# Patient Record
Sex: Male | Born: 1975
Health system: Southern US, Community
[De-identification: ages and names within clinical notes are randomized; demographics above are authoritative.]

## PROBLEM LIST (undated history)

## (undated) ENCOUNTER — Emergency Department (HOSPITAL_BASED_OUTPATIENT_CLINIC_OR_DEPARTMENT_OTHER): Admission: EM | Payer: Self-pay | Source: Home / Self Care

## (undated) DIAGNOSIS — S0990XA Unspecified injury of head, initial encounter: Secondary | ICD-10-CM

## (undated) DIAGNOSIS — E789 Disorder of lipoprotein metabolism, unspecified: Secondary | ICD-10-CM

## (undated) DIAGNOSIS — F419 Anxiety disorder, unspecified: Secondary | ICD-10-CM

## (undated) DIAGNOSIS — R011 Cardiac murmur, unspecified: Secondary | ICD-10-CM

## (undated) DIAGNOSIS — R739 Hyperglycemia, unspecified: Secondary | ICD-10-CM

## (undated) DIAGNOSIS — I10 Essential (primary) hypertension: Secondary | ICD-10-CM

## (undated) DIAGNOSIS — E785 Hyperlipidemia, unspecified: Secondary | ICD-10-CM

## (undated) HISTORY — DX: Disorder of lipoprotein metabolism, unspecified: E78.9

## (undated) HISTORY — PX: LIVER BIOPSY: SHX301

## (undated) HISTORY — PX: OTHER SURGICAL HISTORY: SHX169

## (undated) HISTORY — DX: Hyperlipidemia, unspecified: E78.5

---

## 2011-02-26 ENCOUNTER — Emergency Department (HOSPITAL_BASED_OUTPATIENT_CLINIC_OR_DEPARTMENT_OTHER)
Admission: EM | Admit: 2011-02-26 | Discharge: 2011-02-26 | Disposition: A | Discharge status: Home / Self Care | Payer: Self-pay | Attending: Emergency Medicine | Admitting: Emergency Medicine

## 2011-02-26 ENCOUNTER — Emergency Department (INDEPENDENT_AMBULATORY_CARE_PROVIDER_SITE_OTHER): Payer: Self-pay

## 2011-02-26 ENCOUNTER — Other Ambulatory Visit: Payer: Self-pay

## 2011-02-26 ENCOUNTER — Encounter: Payer: Self-pay | Admitting: Emergency Medicine

## 2011-02-26 DIAGNOSIS — R110 Nausea: Secondary | ICD-10-CM

## 2011-02-26 DIAGNOSIS — R5381 Other malaise: Secondary | ICD-10-CM | POA: Insufficient documentation

## 2011-02-26 DIAGNOSIS — R079 Chest pain, unspecified: Secondary | ICD-10-CM

## 2011-02-26 DIAGNOSIS — R0602 Shortness of breath: Secondary | ICD-10-CM

## 2011-02-26 DIAGNOSIS — R5383 Other fatigue: Secondary | ICD-10-CM | POA: Insufficient documentation

## 2011-02-26 HISTORY — DX: Cardiac murmur, unspecified: R01.1

## 2011-02-26 LAB — BASIC METABOLIC PANEL
Chloride: 101 mEq/L (ref 96–112)
GFR calc Af Amer: >60 mL/min (ref >60)
Potassium: 3.7 mEq/L (ref 3.5–5.1)
Sodium: 136 mEq/L (ref 135–145)

## 2011-02-26 LAB — CARDIAC PANEL(CRET KIN+CKTOT+MB+TROPI)
Relative Index: 0.8 (ref 0.0–2.5)
Total CK: 336 U/L — ABNORMAL HIGH (ref 7–232)
Troponin I: <0.3 ng/mL (ref <0.30)

## 2011-02-26 LAB — CBC
Hemoglobin: 13.5 g/dL (ref 13.0–17.0)
MCHC: 34.2 g/dL (ref 30.0–36.0)
Platelets: 252 10*3/uL (ref 150–400)
RDW: 13.2 % (ref 11.5–15.5)

## 2011-02-26 LAB — DIFFERENTIAL
Basophils Absolute: 0 10*3/uL (ref 0.0–0.1)
Basophils Relative: 0 % (ref 0–1)
Neutro Abs: 8.4 10*3/uL — ABNORMAL HIGH (ref 1.7–7.7)
Neutrophils Relative %: 72 % (ref 43–77)

## 2011-02-26 IMAGING — CR DG CHEST 2V
2 series · 2 of 2 positions shown · non-contrast
Comparison: None.

CLINICAL DATA: Chest pain, shortness of breath and nausea.

CHEST - 2 VIEW

[w chest pa]
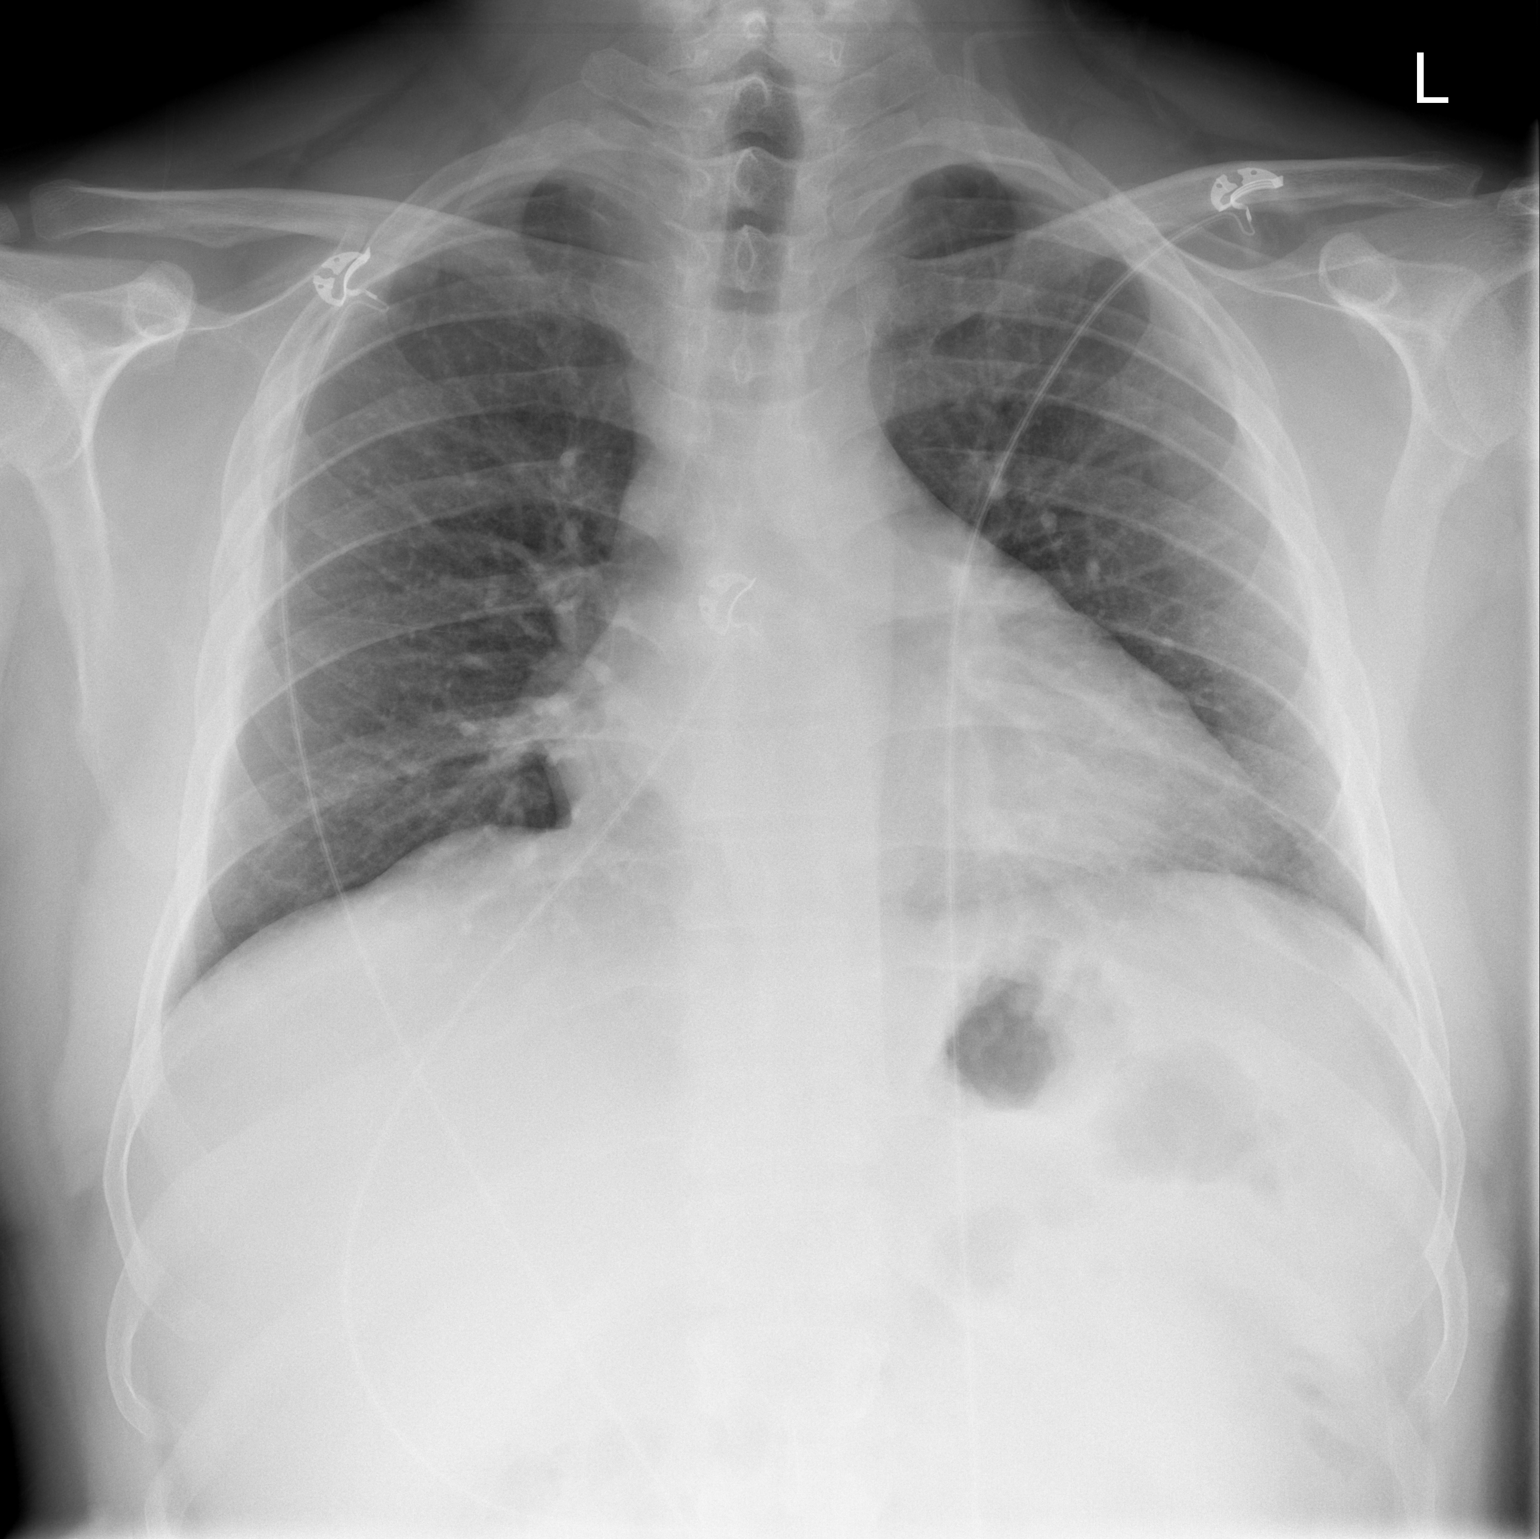

[w chest lat]
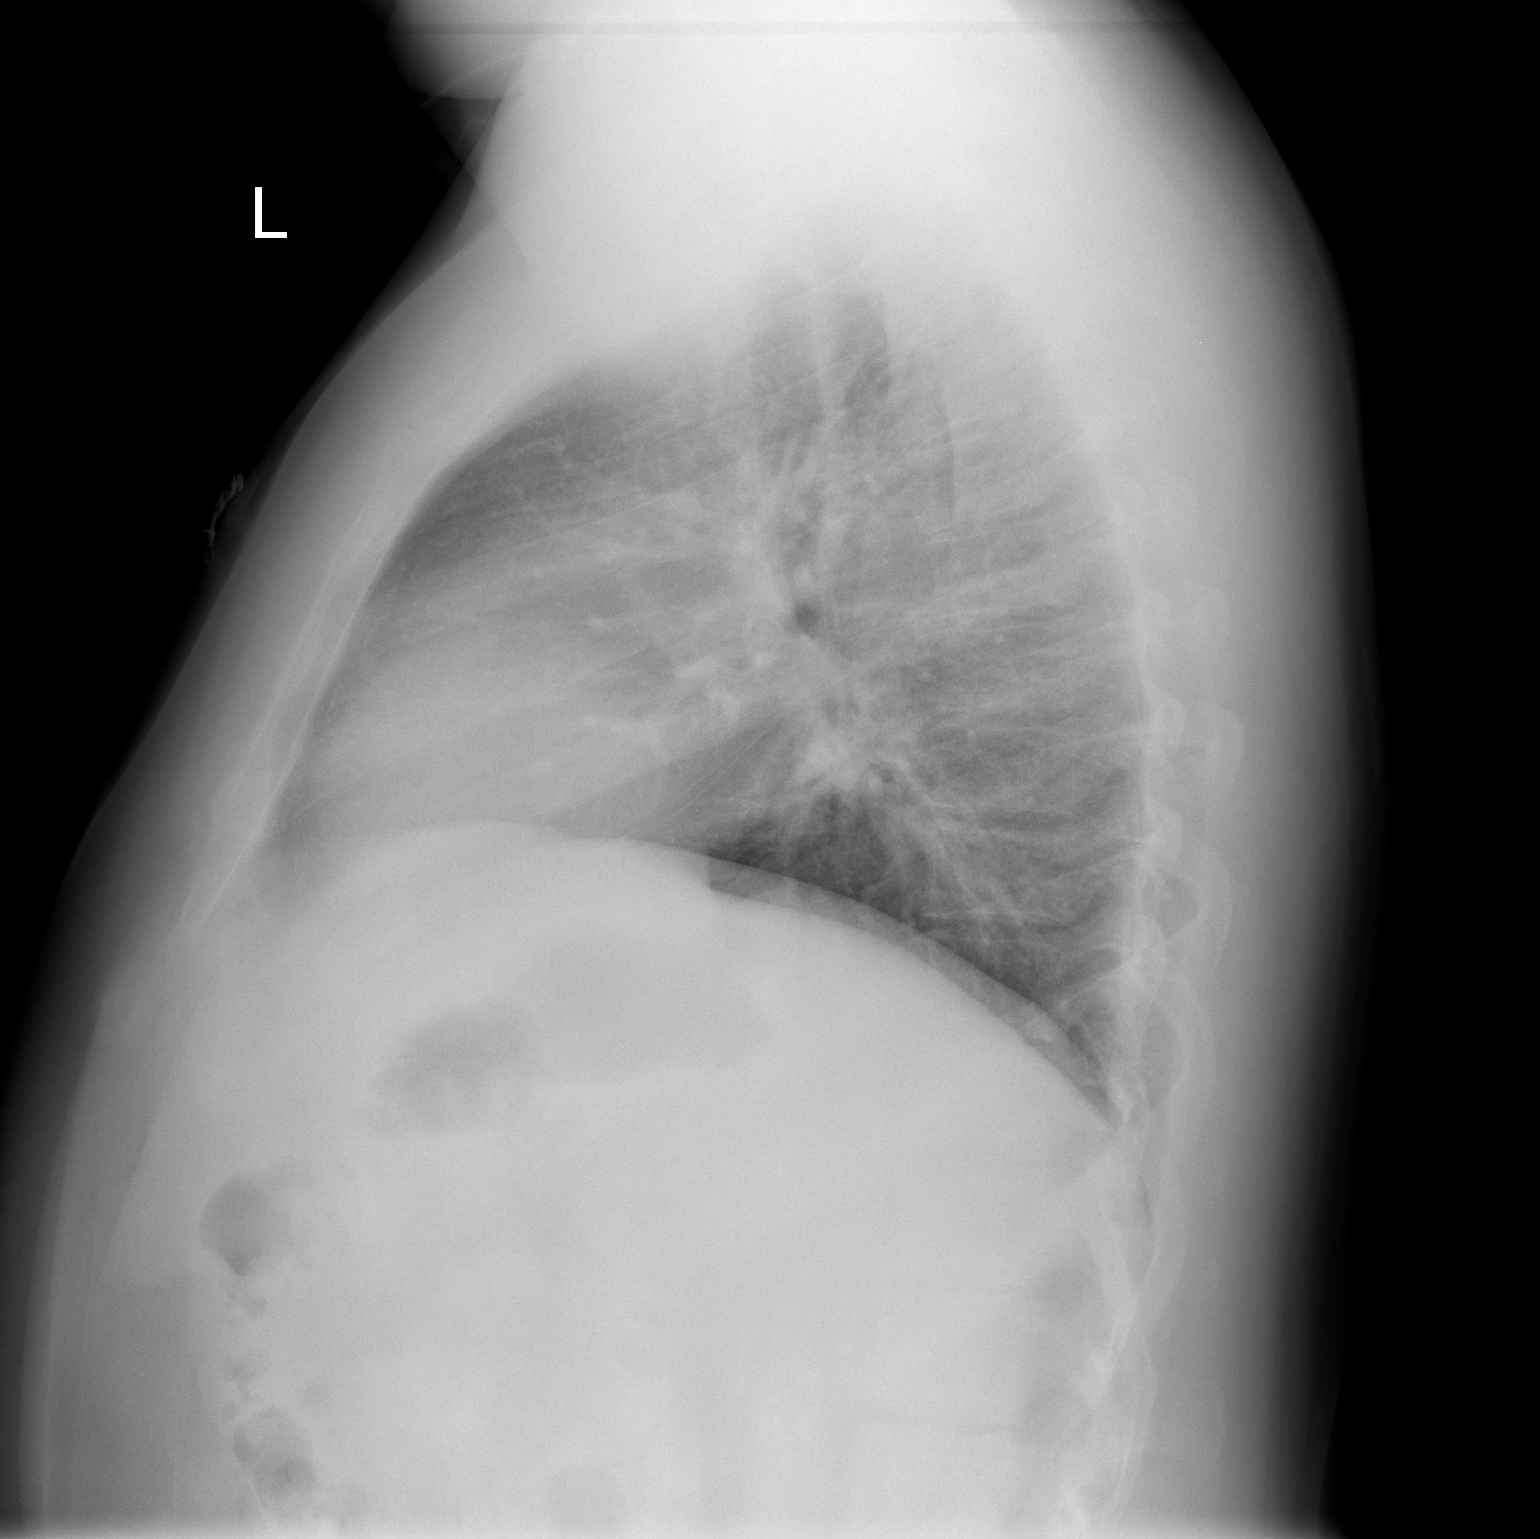

[2 of 2 positions shown; findings below may reference images not displayed]

FINDINGS: The lungs are well-aerated and clear.  There is no
evidence of focal opacification, pleural effusion or pneumothorax.
Retrocardiac density likely reflects normal vasculature.

The heart is normal in size; the mediastinal contour is within
normal limits.  No acute osseous abnormalities are seen.
IMPRESSION: No acute cardiopulmonary process seen.

## 2011-02-26 MED ORDER — AZITHROMYCIN 250 MG PO TABS: 250.0000 mg | ORAL_TABLET | Freq: Every day | ORAL | Status: AC

## 2011-02-26 MED ORDER — AZITHROMYCIN 250 MG PO TABS
500.0000 mg | ORAL_TABLET | Freq: Once | ORAL | Status: AC
  Administered 2011-02-26: 500 mg via ORAL
  Filled 2011-02-26: qty 2

## 2011-02-26 NOTE — ED Provider Notes (Signed)
History     CSN: [id number] Arrival date & time: 02/26/2011  3:48 AM  Chief Complaint  Joshua Brady presents with  . Chest Pain   HPI Comments: Joshua Brady presents with complaint of left-sided chest pain. He has no other medical problems, takes no daily prescription medications and is a nonsmoker. He works as a Electrical engineer at night and notes that tonight is his first shift of the week. He has been up for approximately 22 hours at this time. He states that around 4:00 in the afternoon approximately 12 hours ago he developed a left-sided chest pain which was like somebody "hitting my chest from the inside" and states that this gradually improved and went away with Motrin. It lasted for approximately 4 or 5 hours before going away. Initially it was associated with mild shortness of breath and chills. He has felt some generalized fatigue throughout the day and has not had much to eat. He states that recently he was out of town and has found out that some of the people he was around has been ill with similar symptoms.  Tonight while at work walking from building to building on the campus of [employer], he felt weak, nauseated and fatigued like he wanted to take a nap. He states that he had some recurrence of the chest pain but was not having any trouble with climbing stairs or walking greater than a mile on his route. He denies increased cough but states that he has chronic sinus pain in his face, nasal drainage. He has had some associated headache and neck pain tonight. He is a nonsmoker, nondiabetic, nonhypertensive.  Joshua Brady is a 35 y.o. male presenting with chest pain. The history is provided by the Joshua Brady.  Chest Pain     Past Medical History  Diagnosis Date  . Heart murmur     History reviewed. No pertinent past surgical history.  History reviewed. No pertinent family history.  History  Substance Use Topics  . Smoking status: Never Smoker   . Smokeless tobacco: Not on file  .  Alcohol Use: Yes      Review of Systems  Cardiovascular: Positive for chest pain.  All other systems reviewed and are negative.    Physical Exam  BP 138/89  Pulse 88  Temp(Src) 97.9 F (36.6 C) (Oral)  Resp 18  SpO2 95%  Physical Exam  Nursing note and vitals reviewed. Constitutional: He appears well-developed and well-nourished. No distress.  HENT:  Head: Normocephalic and atraumatic.  Mouth/Throat: Oropharynx is clear and moist. No oropharyngeal exudate.  Eyes: Conjunctivae and EOM are normal. Pupils are equal, round, and reactive to light. Right eye exhibits no discharge. Left eye exhibits no discharge. No scleral icterus.  Neck: Normal range of motion. Neck supple. No JVD present. No thyromegaly present.  Cardiovascular: Normal rate, regular rhythm, normal heart sounds and intact distal pulses.  Exam reveals no gallop and no friction rub.   No murmur heard. Pulmonary/Chest: Effort normal and breath sounds normal. No respiratory distress. He has no wheezes. He has no rales.  Abdominal: Soft. Bowel sounds are normal. He exhibits no distension and no mass. There is no tenderness.  Musculoskeletal: Normal range of motion. He exhibits no edema and no tenderness.  Lymphadenopathy:    He has no cervical adenopathy.  Neurological: He is alert. Coordination normal.  Skin: Skin is warm and dry. No rash noted. No erythema.  Psychiatric: He has a normal mood and affect. His behavior is normal.  ED Course  Procedures  MDM Exam is normal, EKG is normal, suspect some component of infectious syndrome. Has no rash or dysuria or diarrhea. He does have some shortness of breath with left-sided chest pain and intermittent cough. We'll obtain chest x-ray to rule out pneumonia. Lab work pending.  While having chest pain, he presented to the EMS station and had an EKG performed which I have reviewed and appears normal. His was time to 0245 AM  ED ECG REPORT   Date: 02/26/2011   Rate:  85   Rhythm: normal sinus rhythm  QRS Axis: normal  Intervals: normal  ST/T Wave abnormalities: normal  Conduction Disutrbances:none  Narrative Interpretation:   Old EKG Reviewed: none available  Results for orders placed during the hospital encounter of 02/26/11  CARDIAC PANEL(CRET KIN+CKTOT+MB+TROPI)      Component Value Range   Total CK 336 (*) 7 - 232 (U/L)   CK, MB 2.8  0.3 - 4.0 (ng/mL)   Troponin I <0.30  <0.30 (ng/mL)   Relative Index 0.8  0.0 - 2.5   CBC      Component Value Range   WBC 11.7 (*) 4.0 - 10.5 (K/uL)   RBC 4.68  4.22 - 5.81 (MIL/uL)   Hemoglobin 13.5  13.0 - 17.0 (g/dL)   HCT 16.1  09.6 - 04.5 (%)   MCV 84.4  78.0 - 100.0 (fL)   MCH 28.8  26.0 - 34.0 (pg)   MCHC 34.2  30.0 - 36.0 (g/dL)   RDW 40.9  81.1 - 91.4 (%)   Platelets 252  150 - 400 (K/uL)  DIFFERENTIAL      Component Value Range   Neutrophils Relative 72  43 - 77 (%)   Neutro Abs 8.4 (*) 1.7 - 7.7 (K/uL)   Lymphocytes Relative 14  12 - 46 (%)   Lymphs Abs 1.6  0.7 - 4.0 (K/uL)   Monocytes Relative 13 (*) 3 - 12 (%)   Monocytes Absolute 1.6 (*) 0.1 - 1.0 (K/uL)   Eosinophils Relative 1  0 - 5 (%)   Eosinophils Absolute 0.1  0.0 - 0.7 (K/uL)   Basophils Relative 0  0 - 1 (%)   Basophils Absolute 0.0  0.0 - 0.1 (K/uL)  BASIC METABOLIC PANEL      Component Value Range   Sodium 136  135 - 145 (mEq/L)   Potassium 3.7  3.5 - 5.1 (mEq/L)   Chloride 101  96 - 112 (mEq/L)   CO2 25  19 - 32 (mEq/L)   Glucose, Bld 104 (*) 70 - 99 (mg/dL)   BUN 15  6 - 23 (mg/dL)   Creatinine, Ser 7.82  0.50 - 1.35 (mg/dL)   Calcium 9.9  8.4 - 95.6 (mg/dL)   GFR calc non Af Amer >60  >60 (mL/min)   GFR calc Af Amer >60  >60 (mL/min)   No results found.  Imaging shows no significant pneumonia on x-ray. Vital signs normal, no distress. Has a slight leukocytosis with a lymphocytic shift. Suspect infectious process and will treat with antibiotics to cover her sinusitis and atypical pneumonia. Joshua Brady understands  indications for return and agrees to same.  Vida Roller, MD 02/26/11 [phone]

## 2011-02-26 NOTE — ED Notes (Addendum)
Pt reports intermittent chest pain today. While walking tonight at work pt reports chest pain worsened and had shob and nausea with chest pain.

## 2014-06-15 DIAGNOSIS — S0990XA Unspecified injury of head, initial encounter: Secondary | ICD-10-CM

## 2014-06-15 HISTORY — DX: Unspecified injury of head, initial encounter: S09.90XA

## 2015-11-01 ENCOUNTER — Emergency Department (HOSPITAL_BASED_OUTPATIENT_CLINIC_OR_DEPARTMENT_OTHER)
Admission: EM | Admit: 2015-11-01 | Discharge: 2015-11-01 | Disposition: A | Discharge status: Home / Self Care | Payer: Self-pay | Attending: Emergency Medicine | Admitting: Emergency Medicine

## 2015-11-01 ENCOUNTER — Emergency Department (HOSPITAL_BASED_OUTPATIENT_CLINIC_OR_DEPARTMENT_OTHER): Payer: Self-pay

## 2015-11-01 ENCOUNTER — Encounter (HOSPITAL_BASED_OUTPATIENT_CLINIC_OR_DEPARTMENT_OTHER): Payer: Self-pay | Admitting: *Deleted

## 2015-11-01 DIAGNOSIS — W312XXA Contact with powered woodworking and forming machines, initial encounter: Secondary | ICD-10-CM | POA: Insufficient documentation

## 2015-11-01 DIAGNOSIS — S61211A Laceration without foreign body of left index finger without damage to nail, initial encounter: Secondary | ICD-10-CM | POA: Insufficient documentation

## 2015-11-01 DIAGNOSIS — Y9389 Activity, other specified: Secondary | ICD-10-CM | POA: Insufficient documentation

## 2015-11-01 DIAGNOSIS — IMO0002 Reserved for concepts with insufficient information to code with codable children: Secondary | ICD-10-CM

## 2015-11-01 DIAGNOSIS — Y999 Unspecified external cause status: Secondary | ICD-10-CM | POA: Insufficient documentation

## 2015-11-01 DIAGNOSIS — Y929 Unspecified place or not applicable: Secondary | ICD-10-CM | POA: Insufficient documentation

## 2015-11-01 IMAGING — DX DG FINGER INDEX 2+V*L*
3 series · 3 of 3 positions shown · non-contrast
Comparison: None.

CLINICAL DATA: Cut second finger with table saw.  Open wound.

EXAM:
LEFT INDEX FINGER 2+V

[finger ap]
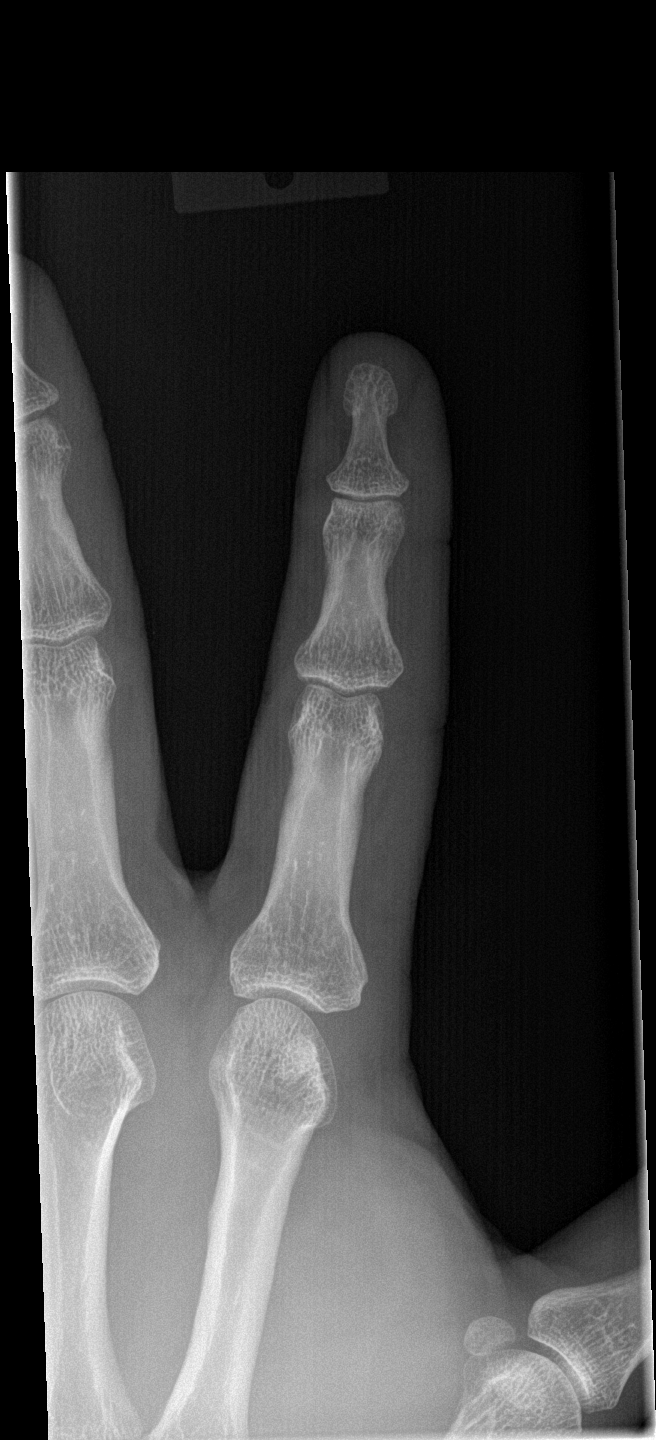

[finger obl]
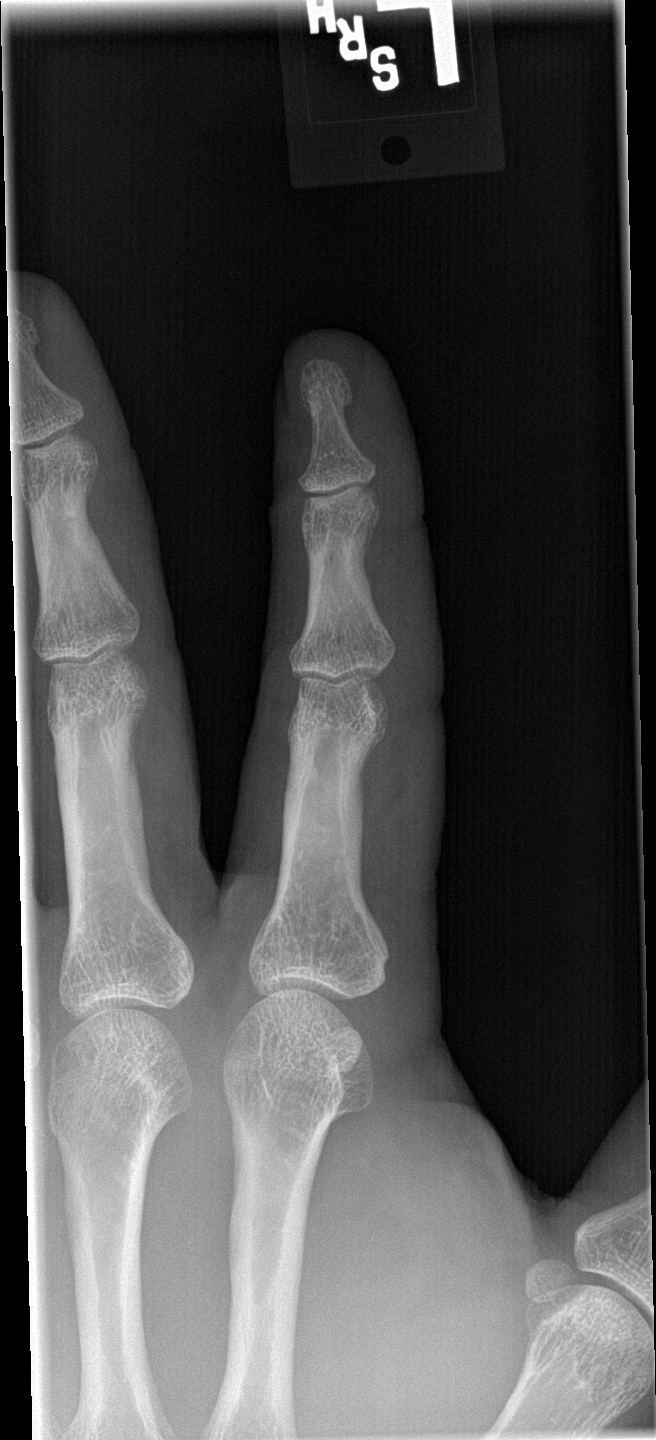

[finger lat]
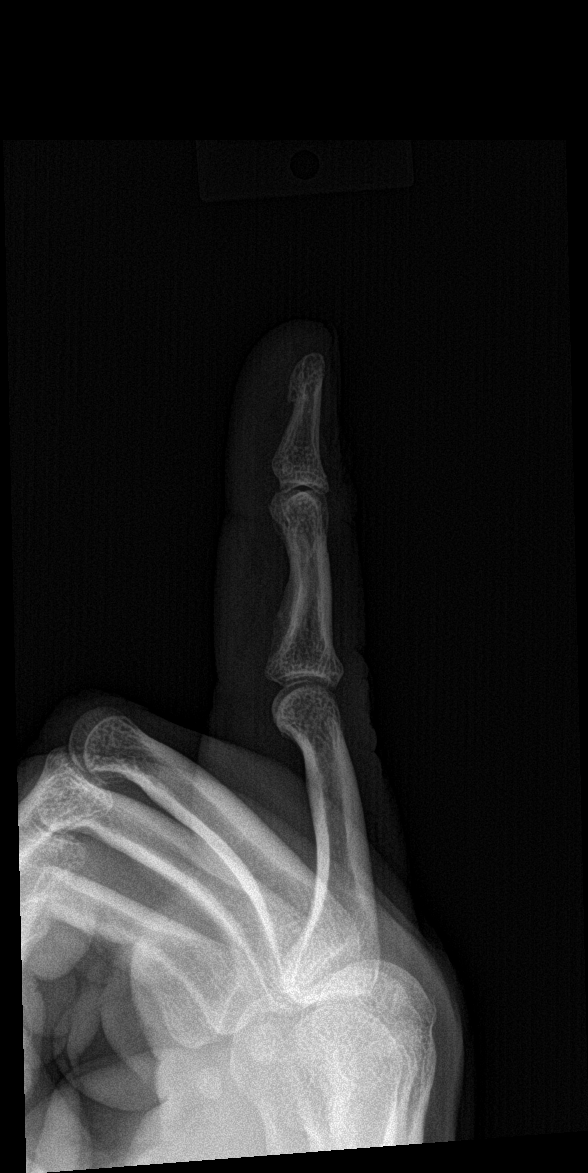

[3 of 3 positions shown; findings below may reference images not displayed]

FINDINGS: Negative for a fracture or dislocation. Alignment of the index
finger is normal. Soft tissue injury is not well appreciated on
these radiographs. No evidence for a radiopaque foreign body.
IMPRESSION: No acute bone abnormality.

## 2015-11-01 MED ORDER — MUPIROCIN 2 % EX OINT: TOPICAL_OINTMENT | CUTANEOUS | Status: DC

## 2015-11-01 MED ORDER — TETANUS-DIPHTH-ACELL PERTUSSIS 5-2.5-18.5 LF-MCG/0.5 IM SUSP
0.5000 mL | Freq: Once | INTRAMUSCULAR | Status: AC
  Administered 2015-11-01: 0.5 mL via INTRAMUSCULAR
  Filled 2015-11-01: qty 0.5

## 2015-11-01 MED FILL — MUPIROCIN 2% OINTMENT: 2 | 10 days supply | Qty: 22 | Fill #0

## 2015-11-01 NOTE — ED Notes (Signed)
PA at bedside.

## 2015-11-01 NOTE — Discharge Instructions (Signed)
Keep wound clean with mild soap and water. Keep area covered with a topical antibiotic ointment and bandage, keep bandage dry, and do not submerge in water for 24 hours.  Monitor area for signs of infection to include, but not limited to: increasing pain, spreading redness, drainage/pus, worsening swelling, or fevers. Return to emergency department for emergent changing or worsening symptoms.   WOUND CARE  Keep area clean and dry for 24 hours. Do not remove bandage, if applied.  After 24 hours,you should change it at least once a day. Also, change the dressing if it becomes wet or dirty, or as directed by your caregiver.   Wash the wound with soap and water 2 times a day. Rinse the wound off with water to remove all soap. Pat the wound dry with a clean towel.   You may shower as usual after the first 24 hours.   Return if you experience any of the following signs of infection: Swelling, redness, pus drainage, streaking, fever >101.0 F  Return if you experience excessive bleeding that does not stop after 15-20 minutes of constant, firm pressure.

## 2015-11-01 NOTE — ED Provider Notes (Signed)
CSN: [id number]     Arrival date & time 11/01/15  1100 History   First MD Initiated Contact with Patient 11/01/15 1111     Chief Complaint  Patient presents with  . Laceration    (Consider location/radiation/quality/duration/timing/severity/associated sxs/prior Treatment) Patient is a 40 y.o. male presenting with skin laceration. The history is provided by medical records and the patient. No language interpreter was used.  Laceration    Joshua Brady is a 40 y.o. male  who presents to the Emergency Department for a laceration of his left distal index finger just prior to arrival after cutting finger while working on his table saw. Immediately wrapped finger with gauze. No medications prior to arrival. No cleaning of wound PTA. Last tetanus shot > 10 years ago. No prior injury to this digit.  Past Medical History  Diagnosis Date  . Heart murmur    History reviewed. No pertinent past surgical history. No family history on file. Social History  Substance Use Topics  . Smoking status: Never Smoker   . Smokeless tobacco: None  . Alcohol Use: Yes    Review of Systems  Constitutional: Negative for fever.  HENT: Negative for congestion.   Eyes: Negative for visual disturbance.  Respiratory: Negative for cough and shortness of breath.   Cardiovascular: Negative.   Gastrointestinal: Negative for abdominal pain.  Musculoskeletal: Negative for joint swelling.  Skin: Positive for wound.  Allergic/Immunologic: Negative for immunocompromised state.  Neurological: Negative for weakness.      Allergies  Review of patient's allergies indicates no known allergies.  Home Medications   Prior to Admission medications   Medication Sig Start Date End Date Taking? Authorizing Provider  mupirocin ointment (BACTROBAN) 2 % Apply to affected area one to two times daily. 11/01/15   Jaime Pilcher Ward, PA-C   BP 107/71 mmHg  Pulse 73  Temp(Src) 97.7 F (36.5 C) (Oral)  Resp 16  Ht 6' (1.829  m)  Wt 120.203 kg  BMI 35.93 kg/m2  SpO2 99% Physical Exam  Constitutional: He is oriented to person, place, and time. He appears well-developed and well-nourished.  Alert and in no acute distress  HENT:  Head: Normocephalic and atraumatic.  Cardiovascular: Normal rate, regular rhythm and normal heart sounds.   Pulmonary/Chest: Effort normal and breath sounds normal. No respiratory distress.  Musculoskeletal:  Patient has full range of motion. The patient has normal sensation and motor function in the median, ulnar, and radial nerve distributions. 2+ Radial pulse.  Neurological: He is alert and oriented to person, place, and time.  Skin: Skin is warm and dry.  1.5 cm laceration over left index finger DIP joint   Nursing note and vitals reviewed.   ED Course  Procedures (including critical care time) Labs Review Labs Reviewed - No data to display  Imaging Review Dg Finger Index Left  11/01/2015  CLINICAL DATA:  Cut second finger with table saw.  Open wound. EXAM: LEFT INDEX FINGER 2+V COMPARISON:  None. FINDINGS: Negative for a fracture or dislocation. Alignment of the index finger is normal. Soft tissue injury is not well appreciated on these radiographs. No evidence for a radiopaque foreign body. IMPRESSION: No acute bone abnormality. Electronically Signed   By: Markus Daft M.D.   On: 11/01/2015 11:55   I have personally reviewed and evaluated these images and lab results as part of my medical decision-making.   EKG Interpretation None      MDM   Final diagnoses:  Laceration   Joshua Brady  presents to ED for laceration of left 2nd index finger just PTA. Wound thoroughly cleaned and explored in ED by me. No foreign bodies seen. X-ray obtained which was unremarkable. On exam, no tissue amenable for suture repair. Wound dressed with antibiotic ointment. PCP follow up encouraged. Return precautions given. Home care instructions discussed. Rx for mupirocin given. All questions  answered.   Patient seen by and discussed with Dr. Lita Brady who agrees with treatment plan.   [facility] Ward, PA-C 11/01/15 1231  Julianne Rice, MD 11/05/15 [phone]

## 2015-11-01 NOTE — ED Notes (Signed)
Laceration to his left 2nd digit with a table saw. Bleeding controlled.

## 2017-01-21 ENCOUNTER — Ambulatory Visit (INDEPENDENT_AMBULATORY_CARE_PROVIDER_SITE_OTHER): Payer: BLUE CROSS/BLUE SHIELD | Admitting: Osteopathic Medicine

## 2017-01-21 ENCOUNTER — Encounter: Payer: Self-pay | Admitting: Osteopathic Medicine

## 2017-01-21 ENCOUNTER — Encounter (INDEPENDENT_AMBULATORY_CARE_PROVIDER_SITE_OTHER): Payer: Self-pay

## 2017-01-21 VITALS — BP 149/91 | HR 64 | Ht 72.0 in | Wt 261.0 lb

## 2017-01-21 DIAGNOSIS — Z0000 Encounter for general adult medical examination without abnormal findings: Secondary | ICD-10-CM | POA: Diagnosis not present

## 2017-01-21 DIAGNOSIS — Z8739 Personal history of other diseases of the musculoskeletal system and connective tissue: Secondary | ICD-10-CM | POA: Diagnosis not present

## 2017-01-21 DIAGNOSIS — Z87820 Personal history of traumatic brain injury: Secondary | ICD-10-CM

## 2017-01-21 HISTORY — DX: Personal history of other diseases of the musculoskeletal system and connective tissue: Z87.39

## 2017-01-21 HISTORY — DX: Personal history of traumatic brain injury: Z87.820

## 2017-01-21 NOTE — Progress Notes (Signed)
HPI: Joshua Brady is a 41 y.o. male  who presents to [facility] today, 01/21/17,  for chief complaint of:  Chief Complaint  Patient presents with  . Establish Care      Patient here for annual physical / wellness exam.  See preventive care reviewed as below.  Recent labs reviewed in detail with the patient.   Additional concerns today include:  None acute Chronic issues: minimal - history of concussion, on and off low back pain, struggles with weight loss.   Blood pressure above goal, patient states due to stress recently. Blood pressure actually a little bit higher on recheck prior to leaving the office. No chest pain, pressure, shortness of breath.    Past medical, surgical, social and family history reviewed: There are no active problems to display for this patient.  No past surgical history on file. Social History  Substance Use Topics  . Smoking status: Never Smoker  . Smokeless tobacco: Never Used  . Alcohol use Yes   Family History  Problem Relation Age of Onset  . Alcohol abuse Father      Current medication list and allergy/intolerance information reviewed:   Current Outpatient Prescriptions  Medication Sig Dispense Refill  . AMBULATORY NON FORMULARY MEDICATION Medication Name: Hydroxycut     No current facility-administered medications for this visit.    Allergies  Allergen Reactions  . Prednisone   . Topiramate     Anxiety, dizzy, depressed, suicidal thoughts, agitated      Review of Systems:  Constitutional:  No  fever, no chills, No recent illness, No unintentional weight changes. No significant fatigue.   HEENT: No  headache, no vision change, no hearing change, No sore throat, No  sinus pressure  Cardiac: No  chest pain, No  pressure, No palpitations, No  Orthopnea  Respiratory:  No  shortness of breath. No  Cough  Gastrointestinal: No  abdominal pain, No  nausea, No  vomiting,  No  blood in stool, No   diarrhea, No  constipation   Musculoskeletal: No new myalgia/arthralgia  Genitourinary: No  incontinence, No  abnormal genital bleeding, No abnormal genital discharge  Skin: No  Rash, No other wounds/concerning lesions  Hem/Onc: No  easy bruising/bleeding, No  abnormal lymph node  Endocrine: No cold intolerance,  No heat intolerance. No polyuria/polydipsia/polyphagia   Neurologic: No  weakness, No  dizziness, No  slurred speech/focal weakness/facial droop  Psychiatric: No  concerns with depression, No  concerns with anxiety, No sleep problems, No mood problems  Exam:  BP (!) 149/91   Pulse 64   Ht 6' (1.829 m)   Wt 261 lb (118.4 kg)   BMI 35.40 kg/m   Constitutional: VS see above. General Appearance: alert, well-developed, well-nourished, NAD  Eyes: Normal lids and conjunctive, non-icteric sclera  Ears, Nose, Mouth, Throat: MMM, Normal external inspection ears/nares/mouth/lips/gums. TM normal bilaterally. Pharynx/tonsils no erythema, no exudate. Nasal mucosa normal.   Neck: No masses, trachea midline. No thyroid enlargement. No tenderness/mass appreciated. No lymphadenopathy  Respiratory: Normal respiratory effort. no wheeze, no rhonchi, no rales  Cardiovascular: S1/S2 normal, no murmur, no rub/gallop auscultated. RRR. No lower extremity edema.  Gastrointestinal: Nontender, no masses. No hepatomegaly, no splenomegaly. No hernia appreciated. Bowel sounds normal. Rectal exam deferred.   Musculoskeletal: Gait normal. No clubbing/cyanosis of digits.   Neurological: Normal balance/coordination. No tremor.   Skin: warm, dry, intact. No rash/ulcer.   Psychiatric: Normal judgment/insight. Normal mood and affect. Oriented x3.  ASSESSMENT/PLAN:   Annual physical exam - Plan: CBC, Lipid panel, TSH, COMPLETE METABOLIC PANEL WITH GFR  History of brain concussion  History of low back pain     MALE PREVENTIVE CARE  updated 01/21/17  ANNUAL SCREENING/COUNSELING  Any  changes to health in the past year? no  Diet/Exercise - HEALTHY HABITS DISCUSSED TO DECREASE CV RISK History  Smoking Status  . Never Smoker  Smokeless Tobacco  . Never Used   History  Alcohol Use  . Yes   Depression screen PHQ 2/9 01/21/2017  Decreased Interest 0  Down, Depressed, Hopeless 0  PHQ - 2 Score 0    SEXUAL/REPRODUCTIVE HEALTH  Sexually active in the past year? - Yes with male.  STI testing needed/desired today? - no  Any concerns with testosterone/libido? - no  INFECTIOUS DISEASE SCREENING  HIV - needs  GC/CT - does not need  HepC - does not need  TB - does not need  CANCER SCREENING  Lung - does not need  Colon - does not need  Prostate - does not need  OTHER DISEASE SCREENING  Lipid - needs  DM2 - needs  AAA - 65-75yo ever smoked: does not need  Osteoporosis - men 43yo+ - does not need   ADULT VACCINATION  Influenza - annual vaccine recommended  Td - booster every 10 years   Zoster - option at 14, yes at 60+   PCV13 - was not indicated  PPSV23 - was not indicated Immunization History  Administered Date(s) Administered  . Tdap 11/01/2015   OTHER  Fall - exercise and Vit D age 34+ - does not need  Consider ASA - age 29-59 - does not need   Visit summary with medication list and pertinent instructions was printed for patient to review. All questions at time of visit were answered - patient instructed to contact office with any additional concerns. ER/RTC precautions were reviewed with the patient. Follow-up plan: Return in about 1 year (around 01/21/2018) for [name], sooner if needed .

## 2017-01-22 LAB — LIPID PANEL
CHOLESTEROL: 229 mg/dL — AB (ref <200)
HDL: 36 mg/dL — AB (ref >40)
LDL CALC: 148 mg/dL — AB (ref <100)
Total CHOL/HDL Ratio: 6.4 Ratio — ABNORMAL HIGH (ref <5.0)
Triglycerides: 224 mg/dL — ABNORMAL HIGH (ref <150)
VLDL: 45 mg/dL — ABNORMAL HIGH (ref <30)

## 2017-01-22 LAB — COMPLETE METABOLIC PANEL WITH GFR
ALT: 42 U/L (ref 9–46)
AST: 28 U/L (ref 10–40)
Albumin: 4.6 g/dL (ref 3.6–5.1)
Alkaline Phosphatase: 84 U/L (ref 40–115)
BILIRUBIN TOTAL: 0.7 mg/dL (ref 0.2–1.2)
BUN: 17 mg/dL (ref 7–25)
CHLORIDE: 104 mmol/L (ref 98–110)
CO2: 24 mmol/L (ref 20–32)
CREATININE: 0.95 mg/dL (ref 0.60–1.35)
Calcium: 9.9 mg/dL (ref 8.6–10.3)
GFR, Est African American: >89 mL/min (ref >=60)
GFR, Est Non African American: >89 mL/min (ref >=60)
Glucose, Bld: 97 mg/dL (ref 65–99)
Potassium: 4.6 mmol/L (ref 3.5–5.3)
Sodium: 138 mmol/L (ref 135–146)
TOTAL PROTEIN: 7.2 g/dL (ref 6.1–8.1)

## 2017-01-22 LAB — TSH: TSH: 2.08 mIU/L (ref 0.40–4.50)

## 2017-01-22 LAB — CBC
HEMATOCRIT: 42.5 % (ref 38.5–50.0)
Hemoglobin: 14.2 g/dL (ref 13.2–17.1)
MCH: 28.9 pg (ref 27.0–33.0)
MCHC: 33.4 g/dL (ref 32.0–36.0)
MCV: 86.6 fL (ref 80.0–100.0)
MPV: 11.5 fL (ref 7.5–12.5)
PLATELETS: 291 10*3/uL (ref 140–400)
RBC: 4.91 MIL/uL (ref 4.20–5.80)
RDW: 14.1 % (ref 11.0–15.0)
WBC: 8 10*3/uL (ref 3.8–10.8)

## 2017-01-25 ENCOUNTER — Encounter: Payer: Self-pay | Admitting: Physician Assistant

## 2017-01-25 DIAGNOSIS — E789 Disorder of lipoprotein metabolism, unspecified: Secondary | ICD-10-CM | POA: Insufficient documentation

## 2017-01-25 DIAGNOSIS — E785 Hyperlipidemia, unspecified: Secondary | ICD-10-CM

## 2017-01-25 DIAGNOSIS — F419 Anxiety disorder, unspecified: Secondary | ICD-10-CM | POA: Insufficient documentation

## 2017-01-25 HISTORY — DX: Hyperlipidemia, unspecified: E78.5

## 2017-01-25 HISTORY — DX: Disorder of lipoprotein metabolism, unspecified: E78.9

## 2017-01-25 NOTE — Progress Notes (Signed)
Hi Joshua Brady,  Your labs show that your total and LDL cholesterol are borderline high. LDL (bad cholesterol) is associated with increased risk of heart disease and heart attack. When it gets above 160, it is recommended to start medication. You are not in that range yet, but I would recommend aggressive lifestyle therapy to include a low-fat diet (5% of calories from fat) and regular cardiovascular exercise, 3-4 days per week x 40 minutes. The DASH eating plan is a good heart-healthy diet to follow. I would recommend rechecking your fasting lipid panel in 6 months.   Your other labs look good. - normal electrolytes, kidney function and liver enzymes - normal blood counts - no evidence of diabetes - no evidence of thyroid disease  Best, Evlyn Clines

## 2017-11-18 ENCOUNTER — Ambulatory Visit: Payer: BLUE CROSS/BLUE SHIELD | Admitting: Osteopathic Medicine

## 2018-02-21 ENCOUNTER — Emergency Department (HOSPITAL_BASED_OUTPATIENT_CLINIC_OR_DEPARTMENT_OTHER)
Admission: EM | Admit: 2018-02-21 | Discharge: 2018-02-22 | Disposition: A | Discharge status: Home / Self Care | Payer: No Typology Code available for payment source | Attending: Emergency Medicine | Admitting: Emergency Medicine

## 2018-02-21 ENCOUNTER — Encounter (HOSPITAL_BASED_OUTPATIENT_CLINIC_OR_DEPARTMENT_OTHER): Payer: Self-pay

## 2018-02-21 ENCOUNTER — Other Ambulatory Visit: Payer: Self-pay

## 2018-02-21 ENCOUNTER — Emergency Department (HOSPITAL_BASED_OUTPATIENT_CLINIC_OR_DEPARTMENT_OTHER): Payer: No Typology Code available for payment source

## 2018-02-21 DIAGNOSIS — H53149 Visual discomfort, unspecified: Secondary | ICD-10-CM | POA: Insufficient documentation

## 2018-02-21 DIAGNOSIS — R110 Nausea: Secondary | ICD-10-CM | POA: Diagnosis not present

## 2018-02-21 DIAGNOSIS — R5383 Other fatigue: Secondary | ICD-10-CM | POA: Diagnosis not present

## 2018-02-21 DIAGNOSIS — R52 Pain, unspecified: Secondary | ICD-10-CM

## 2018-02-21 DIAGNOSIS — R519 Headache, unspecified: Secondary | ICD-10-CM

## 2018-02-21 DIAGNOSIS — R531 Weakness: Secondary | ICD-10-CM | POA: Diagnosis not present

## 2018-02-21 DIAGNOSIS — M791 Myalgia, unspecified site: Secondary | ICD-10-CM | POA: Diagnosis not present

## 2018-02-21 DIAGNOSIS — R51 Headache: Secondary | ICD-10-CM | POA: Insufficient documentation

## 2018-02-21 HISTORY — DX: Unspecified injury of head, initial encounter: S09.90XA

## 2018-02-21 LAB — CBC WITH DIFFERENTIAL/PLATELET
BASOS ABS: 0 10*3/uL (ref 0.0–0.1)
BASOS PCT: 0 %
EOS ABS: 0.2 10*3/uL (ref 0.0–0.7)
Eosinophils Relative: 2 %
HEMATOCRIT: 40.7 % (ref 39.0–52.0)
HEMOGLOBIN: 13.8 g/dL (ref 13.0–17.0)
Lymphocytes Relative: 48 %
Lymphs Abs: 4.5 10*3/uL — ABNORMAL HIGH (ref 0.7–4.0)
MCH: 29.3 pg (ref 26.0–34.0)
MCHC: 33.9 g/dL (ref 30.0–36.0)
MCV: 86.4 fL (ref 78.0–100.0)
Monocytes Absolute: 0.9 10*3/uL (ref 0.1–1.0)
Monocytes Relative: 10 %
NEUTROS ABS: 3.8 10*3/uL (ref 1.7–7.7)
NEUTROS PCT: 40 %
Platelets: 262 10*3/uL (ref 150–400)
RBC: 4.71 MIL/uL (ref 4.22–5.81)
RDW: 13.3 % (ref 11.5–15.5)
WBC: 9.4 10*3/uL (ref 4.0–10.5)

## 2018-02-21 LAB — COMPREHENSIVE METABOLIC PANEL
ALT: 66 U/L — AB (ref 0–44)
AST: 45 U/L — AB (ref 15–41)
Albumin: 4 g/dL (ref 3.5–5.0)
Alkaline Phosphatase: 71 U/L (ref 38–126)
Anion gap: 10 (ref 5–15)
BUN: 16 mg/dL (ref 6–20)
CHLORIDE: 103 mmol/L (ref 98–111)
CO2: 24 mmol/L (ref 22–32)
CREATININE: 0.9 mg/dL (ref 0.61–1.24)
Calcium: 8.9 mg/dL (ref 8.9–10.3)
GFR calc Af Amer: >60 mL/min (ref >60)
GFR calc non Af Amer: >60 mL/min (ref >60)
Glucose, Bld: 97 mg/dL (ref 70–99)
POTASSIUM: 3.9 mmol/L (ref 3.5–5.1)
SODIUM: 137 mmol/L (ref 135–145)
Total Bilirubin: 0.6 mg/dL (ref 0.3–1.2)
Total Protein: 7.3 g/dL (ref 6.5–8.1)

## 2018-02-21 LAB — CBG MONITORING, ED: GLUCOSE-CAPILLARY: 97 mg/dL (ref 70–99)

## 2018-02-21 LAB — CK: CK TOTAL: 442 U/L — AB (ref 49–397)

## 2018-02-21 MED ORDER — DIPHENHYDRAMINE HCL 50 MG/ML IJ SOLN
25.0000 mg | Freq: Once | INTRAMUSCULAR | Status: AC
  Administered 2018-02-21: 25 mg via INTRAVENOUS
  Filled 2018-02-21: qty 1

## 2018-02-21 MED ORDER — SODIUM CHLORIDE 0.9 % IV BOLUS
1000.0000 mL | Freq: Once | INTRAVENOUS | Status: AC
  Administered 2018-02-21: 1000 mL via INTRAVENOUS

## 2018-02-21 MED ORDER — PROCHLORPERAZINE EDISYLATE 10 MG/2ML IJ SOLN
10.0000 mg | Freq: Once | INTRAMUSCULAR | Status: AC
  Administered 2018-02-21: 10 mg via INTRAVENOUS
  Filled 2018-02-21: qty 2

## 2018-02-21 NOTE — ED Triage Notes (Addendum)
Pt states he was outside for an hour at lunch time and when he came inside he felt tired, nauseated, and had generalized body aches, currently has a headache with photosensitivity and fatigue, pt has not taken anything for headache, states his urine is clear and he rehydrated with gatorade

## 2018-02-21 NOTE — Discharge Instructions (Addendum)
You were seen here today for headache and generalized body aches.  Your lab work and imaging was reassuring.  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  Please stay well hydrated and make sure to rest over the next several days.   If you develop worsening or new concerning symptoms you can return to the emergency department for re-evaluation.  Return also if: Your headache becomes severe. You have repeated vomiting. You have a stiff neck. You have a loss of vision. You have problems with speech. You have pain in the eye or ear. You have muscular weakness or loss of muscle control, especially on one side.  You lose your balance or have trouble walking. You feel faint or pass out.

## 2018-02-21 NOTE — ED Notes (Signed)
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  After being in the heat the Pt. Reports he felt weak all over.  Pt. Reports he went home and drank a gator aid and ate an ice cream and chips.  Pt. Reports he has a headache now that is in the side and in the back.

## 2018-02-21 NOTE — ED Provider Notes (Addendum)
[facility] Provider Note   CSN: [id number] Arrival date & time: 02/21/18  1826     History   Chief Complaint Chief Complaint  Patient presents with  . Headache    HPI Donyell Ding is a 42 y.o. male who presents emergency department today for headache.  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  He states that he had been sweating in the heat or his uniform.  When he went inside to try to cool off he felt generalized weakness, nauseated, generalized body aches as well as a gradual onset of a bilateral temporal headache.  He states that he tried to hydrate himself with Gatorade and eat a Snickers ice cream as well as chips that mildly improved his symptoms.  He notes that his headache is continued despite this which is why he is presenting today.  Patient reports that he now has photosensitivity to light.  He denies any associated fever, head trauma, phonophobia, vomiting, neck stiffness, chest pain, shortness of breath, cough, abdominal pain, focal numbness/tingling/weakness, aphasia, low back pain, difficulty with gait, vertigo, tinnitus. No prior history of migraines.  HPI  Past Medical History:  Diagnosis Date  . Borderline high cholesterol 01/25/2017   10-yr ASCVD 4.1%  . Dyslipidemia (high LDL; low HDL) 01/25/2017   10-yr ASCVD risk 4.1% (01/2017)  . Head injury 2016  . Heart murmur     Patient Active Problem List   Diagnosis Date Noted  . Borderline high cholesterol 01/25/2017  . Dyslipidemia (high LDL; low HDL) 01/25/2017  . History of brain concussion 01/21/2017  . History of low back pain 01/21/2017    History reviewed. No pertinent surgical history.      Home Medications    Prior to Admission medications   Medication Sig Start Date End Date Taking? Authorizing Provider  AMBULATORY NON FORMULARY MEDICATION Medication Name: Hydroxycut     [provider]    Family History Family History  Problem Relation Age of Onset  . Alcohol abuse Father     Social History Social History   Tobacco Use  . Smoking status: Never Smoker  . Smokeless tobacco: Never Used  Substance Use Topics  . Alcohol use: Yes  . Drug use: No     Allergies   Prednisone and Topiramate   Review of Systems Review of Systems  All other systems reviewed and are negative.    Physical Exam Updated Vital Signs BP (!) 151/96   Pulse 65   Temp 98.9 F (37.2 C) (Oral)   Resp 20   Ht 6' (1.829 m)   Wt 117.9 kg   SpO2 97%   BMI 35.26 kg/m   Physical Exam  Constitutional: He appears well-developed and well-nourished.  HENT:  Head: Normocephalic and atraumatic.  Right Ear: External ear normal.  Left Ear: External ear normal.  Nose: Nose normal.  Mouth/Throat: Uvula is midline and oropharynx is clear and moist. Mucous membranes are dry. No tonsillar exudate.  No temporal tenderness palpation.  Eyes: Pupils are equal, round, and reactive to light. Right eye exhibits no discharge. Left eye exhibits no discharge. No scleral icterus.  No vertical or rotary nystagmus  Neck: Trachea normal. Neck supple. No spinous process tenderness present. No neck rigidity. Normal range of motion present.  Cardiovascular: Normal rate, regular rhythm and intact distal pulses.  No murmur heard. Pulses:      Radial pulses are  2+ on the right side, and 2+ on the left side.       Dorsalis pedis pulses are 2+ on the right side, and 2+ on the left side.       Posterior tibial pulses are 2+ on the right side, and 2+ on the left side.  No lower extremity swelling or edema. Calves symmetric in size bilaterally.  Pulmonary/Chest: Effort normal and breath sounds normal. He exhibits no tenderness.  Abdominal: Soft. Bowel sounds are normal. There is no tenderness. There is no rigidity, no rebound, no guarding and no CVA tenderness.  Musculoskeletal: He exhibits  no edema.  Lymphadenopathy:    He has no cervical adenopathy.  Neurological: He is alert.  Mental Status:  Alert, oriented, thought content appropriate, able to give a coherent history. Speech fluent without evidence of aphasia. Able to follow 2 step commands without difficulty.  Cranial Nerves:  II:  Peripheral visual fields grossly normal, pupils equal, round, reactive to light III,IV, VI: ptosis not present, extra-ocular motions intact bilaterally  V,VII: smile symmetric, eyebrows raise symmetric, facial light touch sensation equal VIII: hearing grossly normal to voice  X: uvula elevates symmetrically  XI: bilateral shoulder shrug symmetric and strong XII: midline tongue extension without fassiculations Motor:  Normal tone. 5/5 in upper and lower extremities bilaterally including strong and equal grip strength and dorsiflexion/plantar flexion Sensory: Sensation intact to light touch in all extremities. Negative Romberg.  Deep Tendon Reflexes: 2+ and symmetric in the biceps and patella Cerebellar: normal finger-to-nose with bilateral upper extremities. Normal heel-to -shin balance bilaterally of the lower extremity. No pronator drift.  Gait: normal gait and balance CV: distal pulses palpable throughout   Skin: Skin is warm and dry. No rash noted. He is not diaphoretic.  Psychiatric: He has a normal mood and affect.  Nursing note and vitals reviewed.    ED Treatments / Results  Labs (all labs ordered are listed, but only abnormal results are displayed) Labs Reviewed  CBC WITH DIFFERENTIAL/PLATELET - Abnormal; Notable for the following components:      Result Value   Lymphs Abs 4.5 (*)    All other components within normal limits  CK - Abnormal; Notable for the following components:   Total CK 442 (*)    All other components within normal limits  COMPREHENSIVE METABOLIC PANEL - Abnormal; Notable for the following components:   AST 45 (*)    ALT 66 (*)    All other components  within normal limits  CBG MONITORING, ED    EKG EKG Interpretation  Date/Time:  Monday February 21 2018 21:59:35 EDT Ventricular Rate:  55 PR Interval:    QRS Duration: 98 QT Interval:  434 QTC Calculation: 416 R Axis:   59 Text Interpretation:  Sinus rhythm Borderline T abnormalities, inferior leads inferior changes new from prior 9/12 Confirmed by Aletta Edouard [phone]) on 02/22/2018 3:59:51 PM   Radiology Ct Head Wo Contrast  Result Date: 02/21/2018 CLINICAL DATA:  Headache, acute, normal neuro exam. EXAM: CT HEAD WITHOUT CONTRAST TECHNIQUE: Contiguous axial images were obtained from the base of the skull through the vertex without intravenous contrast. COMPARISON:  Brain MRI 09/26/2014 FINDINGS: Brain: Brain volume is normal for age. No intracranial hemorrhage, mass effect, or midline shift. Small CSF density in the anterior right middle cranial fossa consistent with incidental arachnoid cyst, unchanged from prior MRI. No hydrocephalus. The basilar cisterns are patent. No evidence of territorial infarct or acute ischemia. Vascular: No hyperdense vessel or unexpected calcification.  Skull: No fracture or focal lesion. Sinuses/Orbits: Paranasal sinuses and mastoid air cells are clear. The visualized orbits are unremarkable. Other: None. IMPRESSION: 1.  No acute intracranial abnormality. 2. Incidental small arachnoid cyst in the right middle cranial fossa, chronic and unchanged from prior MRI. Electronically Signed   By: Keith Rake M.D.   On: 02/21/2018 23:14    Procedures Procedures (including critical care time)  Medications Ordered in ED Medications  sodium chloride 0.9 % bolus 1,000 mL (has no administration in time range)  prochlorperazine (COMPAZINE) injection 10 mg (has no administration in time range)  diphenhydrAMINE (BENADRYL) injection 25 mg (has no administration in time range)     Initial Impression / Assessment and Plan / ED Course  I have reviewed the triage  vital signs and the nursing notes.  Pertinent labs & imaging results that were available during my care of the patient were reviewed by me and considered in my medical decision making (see chart for details).     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  Patient denies any syncope, vertigo, focal weakness, numbness, aphasia or injury.  Patient is without fever to suggest heatstroke.  He is without focal deficits and has normal neurologic exam.  Symptoms are not typical of TIA or CVA.  Headache is atypical for temporal arteritis, meningitis.  CT scan without any acute findings.  Lab work obtained and reassuring.  Mild elevation in patient's CK but without evidence of rhabdo.  Patient given fluid and headache cocktail in the department and full relief of symptoms.  Advised the patient stay well-hydrated and rest over the next several days.  I have given a note for work.  Mom states understanding and agreement plan.  Return precautions were discussed.  Patient appears safe for discharge.  Final Clinical Impressions(s) / ED Diagnoses   Final diagnoses:  Bad headache  Generalized body aches    ED Discharge Orders    None       Jillyn Ledger, PA-C 02/23/18 0056    Jillyn Ledger, PA-C 02/23/18 2774    Julianne Rice, MD 02/25/18 1539

## 2018-02-21 NOTE — ED Notes (Signed)
Pt. Resting with eyes closed.  Pt. Reports he is in no pain and is alert to speak with and feeling better he reports.

## 2018-02-27 ENCOUNTER — Emergency Department (HOSPITAL_BASED_OUTPATIENT_CLINIC_OR_DEPARTMENT_OTHER)
Admission: EM | Admit: 2018-02-27 | Discharge: 2018-02-27 | Disposition: A | Discharge status: Home / Self Care | Payer: PRIVATE HEALTH INSURANCE | Attending: Emergency Medicine | Admitting: Emergency Medicine

## 2018-02-27 ENCOUNTER — Encounter (HOSPITAL_BASED_OUTPATIENT_CLINIC_OR_DEPARTMENT_OTHER): Payer: Self-pay

## 2018-02-27 DIAGNOSIS — L509 Urticaria, unspecified: Secondary | ICD-10-CM | POA: Insufficient documentation

## 2018-02-27 MED ORDER — FAMOTIDINE 20 MG PO TABS
40.0000 mg | ORAL_TABLET | Freq: Once | ORAL | Status: AC
  Administered 2018-02-27: 40 mg via ORAL
  Filled 2018-02-27: qty 2

## 2018-02-27 MED ORDER — ONDANSETRON HCL 4 MG/2ML IJ SOLN
4.0000 mg | Freq: Once | INTRAMUSCULAR | Status: DC
  Filled 2018-02-27: qty 2

## 2018-02-27 MED ORDER — DIPHENHYDRAMINE HCL 50 MG/ML IJ SOLN
50.0000 mg | Freq: Once | INTRAMUSCULAR | Status: AC
  Administered 2018-02-27: 50 mg via INTRAVENOUS
  Filled 2018-02-27: qty 1

## 2018-02-27 MED ORDER — PREDNISONE 20 MG PO TABS: 40.0000 mg | ORAL_TABLET | Freq: Every day | ORAL | 0 refills | Status: DC

## 2018-02-27 MED ORDER — METHYLPREDNISOLONE SODIUM SUCC 125 MG IJ SOLR
125.0000 mg | Freq: Once | INTRAMUSCULAR | Status: AC
  Administered 2018-02-27: 125 mg via INTRAVENOUS
  Filled 2018-02-27: qty 2

## 2018-02-27 NOTE — ED Notes (Signed)
ED Provider at bedside. 

## 2018-02-27 NOTE — ED Triage Notes (Signed)
Pt states he was had fire ant bites at 19:30. Pt has diffuse hives, itching and reddness. Pt denies any difficulty breathing.Pt took 25mg  of benadryl at 20:45.

## 2018-02-27 NOTE — ED Provider Notes (Signed)
[facility] Provider Note   CSN: [id number] Arrival date & time: 02/27/18  2107     History   Chief Complaint Chief Complaint  Patient presents with  . Allergic Reaction    HPI Joshua Brady is a 42 y.o. male.   Patient presents with complaint of generalized hives starting earlier this evening after being bitten by fire ants.  Patient had a full body rash.  He has had ongoing headaches and nausea but no worsening in the symptoms tonight.  No vomiting or diarrhea.  No lightheadedness or syncope.  No lip swelling, tongue swelling, or voice change.  Patient has never had an allergic reaction like this from ants.  Patient took 25 mg of Benadryl prior to arrival without any improvement. The onset of this condition was acute. The course is constant. Aggravating factors: none. Alleviating factors: none.       Past Medical History:  Diagnosis Date  . Borderline high cholesterol 01/25/2017   10-yr ASCVD 4.1%  . Dyslipidemia (high LDL; low HDL) 01/25/2017   10-yr ASCVD risk 4.1% (01/2017)  . Head injury 2016  . Heart murmur     Patient Active Problem List   Diagnosis Date Noted  . Borderline high cholesterol 01/25/2017  . Dyslipidemia (high LDL; low HDL) 01/25/2017  . History of brain concussion 01/21/2017  . History of low back pain 01/21/2017    History reviewed. No pertinent surgical history.      Home Medications    Prior to Admission medications   Medication Sig Start Date End Date Taking? Authorizing Provider  AMBULATORY NON FORMULARY MEDICATION Medication Name: Hydroxycut    [provider]    Family History Family History  Problem Relation Age of Onset  . Alcohol abuse Father     Social History Social History   Tobacco Use  . Smoking status: Never Smoker  . Smokeless tobacco: Never Used  Substance Use Topics  . Alcohol use: Yes  . Drug use: No     Allergies   Prednisone and Topiramate   Review of  Systems Review of Systems  Constitutional: Negative for fever.  HENT: Negative for facial swelling and trouble swallowing.   Eyes: Negative for redness.  Respiratory: Negative for shortness of breath, wheezing and stridor.   Cardiovascular: Negative for chest pain.  Gastrointestinal: Negative for nausea and vomiting.  Musculoskeletal: Negative for myalgias.  Skin: Positive for rash.  Neurological: Negative for light-headedness.  Psychiatric/Behavioral: Negative for confusion.     Physical Exam Updated Vital Signs BP 125/80 (BP Location: Left Arm)   Pulse (!) 125   Temp 97.6 F (36.4 C) (Oral)   Resp (!) 22   Ht 6' (1.829 m)   Wt 120.2 kg   SpO2 100%   BMI 35.94 kg/m    Physical Exam  Constitutional: He appears well-developed and well-nourished.  HENT:  Head: Normocephalic and atraumatic.  Mouth/Throat: Oropharynx is clear and moist.  No signs of angioedema.  Eyes: Conjunctivae are normal. Right eye exhibits no discharge. Left eye exhibits no discharge.  Neck: Normal range of motion. Neck supple.  Cardiovascular: Normal rate, regular rhythm and normal heart sounds.  No significant tachycardia at time of my exam.  Pulmonary/Chest: Effort normal and breath sounds normal.  Abdominal: Soft. There is no tenderness.  Neurological: He is alert.  Skin: Skin is warm and dry.  Patient with generalized hives over extremities and torso.  Psychiatric: He has a normal mood and affect.  Nursing note  and vitals reviewed.    ED Treatments / Results  Labs (all labs ordered are listed, but only abnormal results are displayed) Labs Reviewed - No data to display  EKG None  Radiology No results found.  Procedures Procedures (including critical care time)  Medications Ordered in ED Medications  ondansetron (ZOFRAN) injection 4 mg (4 mg Intravenous Not Given 02/27/18 2314)  methylPREDNISolone sodium succinate (SOLU-MEDROL) 125 mg/2 mL injection 125 mg (125 mg Intravenous Given  02/27/18 2129)  famotidine (PEPCID) tablet 40 mg (40 mg Oral Given 02/27/18 2129)  diphenhydrAMINE (BENADRYL) injection 50 mg (50 mg Intravenous Given 02/27/18 2134)     Initial Impression / Assessment and Plan / ED Course  I have reviewed the triage vital signs and the nursing notes.  Pertinent labs & imaging results that were available during my care of the patient were reviewed by me and considered in my medical decision making (see chart for details).     Patient seen and examined. No indications for epinephrine at time of arrival.  Medications ordered.  Vital signs reviewed and are as follows: BP 125/80 (BP Location: Left Arm)   Pulse (!) 125   Temp 97.6 F (36.4 C) (Oral)   Resp (!) 22   Ht 6' (1.829 m)   Wt 120.2 kg   SpO2 100%   BMI 35.94 kg/m    10:55 PM Symptoms are somewhat improved.  Patient had an episode of nausea without vomiting.    11:43 PM patient continues to improve.  He is comfortable discharged home at this time.  Discussed continued use of antihistamines over the next couple of days.  He is given a prescription for prednisone.  He has a mild intolerance to this but will take if necessary.  Encourage PCP follow-up as needed.  Return in the future with worsening symptoms including facial or airway swelling, lightheadedness or syncope.  Patient verbalizes understanding agrees with plan.  Final Clinical Impressions(s) / ED Diagnoses   Final diagnoses:  Urticaria   Patient with urticaria after fire ant bites today.  No signs of anaphylaxis.  Treated in the emergency department with clinical improvement.  Home with continued histamine blockade and steroids.  ED Discharge Orders         Ordered    predniSONE (DELTASONE) 20 MG tablet  Daily     02/27/18 2329          Carlisle Cater, PA-C 02/27/18 2346  Jola Schmidt, MD 02/28/18 0001

## 2018-02-27 NOTE — Discharge Instructions (Signed)
Please read and follow all provided instructions.  Your diagnoses today include:  1. Urticaria     Tests performed today include:  Vital signs. See below for your results today.   Medications prescribed:   Prednisone - steroid medicine   It is best to take this medication in the morning to prevent sleeping problems. If you are diabetic, monitor your blood sugar closely and stop taking Prednisone if blood sugar is over 300. Take with food to prevent stomach upset.   Take any prescribed medications only as directed.  Home care instructions:   Follow any educational materials contained in this packet  Follow-up instructions: Please follow-up with your primary care provider in the next 3 days for further evaluation of your symptoms.   Return instructions:   Please return to the Emergency Department if you experience worsening symptoms.   Call 9-1-1 immediately if you have an allergic reaction that involves your lips, mouth, throat or if you have any difficulty breathing. This is a life-threatening emergency.   Please return if you have any other emergent concerns.  Additional Information:  Your vital signs today were: BP 115/76    Pulse 75    Temp 97.6 F (36.4 C) (Oral)    Resp (!) 22    Ht 6' (1.829 m)    Wt 120.2 kg    SpO2 95%    BMI 35.94 kg/m  If your blood pressure (BP) was elevated above 135/85 this visit, please have this repeated by your doctor within one month. --------------

## 2018-02-27 NOTE — ED Notes (Signed)
Pt c/o nausea. Assisted to BR via wheelchair. Family with him in restroom

## 2018-02-27 NOTE — ED Notes (Signed)
Pt given Rx x 1 for prednisone and note for work. D/c home with ride

## 2018-02-27 NOTE — ED Notes (Signed)
Family at bedside. 

## 2018-03-03 ENCOUNTER — Ambulatory Visit (INDEPENDENT_AMBULATORY_CARE_PROVIDER_SITE_OTHER): Payer: PRIVATE HEALTH INSURANCE | Admitting: Osteopathic Medicine

## 2018-03-03 ENCOUNTER — Encounter: Payer: Self-pay | Admitting: Osteopathic Medicine

## 2018-03-03 ENCOUNTER — Ambulatory Visit (INDEPENDENT_AMBULATORY_CARE_PROVIDER_SITE_OTHER): Payer: PRIVATE HEALTH INSURANCE

## 2018-03-03 VITALS — BP 130/87 | HR 65 | Temp 97.8°F | Wt 269.9 lb

## 2018-03-03 DIAGNOSIS — G930 Cerebral cysts: Secondary | ICD-10-CM | POA: Insufficient documentation

## 2018-03-03 DIAGNOSIS — R0602 Shortness of breath: Secondary | ICD-10-CM

## 2018-03-03 DIAGNOSIS — I10 Essential (primary) hypertension: Secondary | ICD-10-CM | POA: Insufficient documentation

## 2018-03-03 DIAGNOSIS — R002 Palpitations: Secondary | ICD-10-CM | POA: Insufficient documentation

## 2018-03-03 DIAGNOSIS — R7989 Other specified abnormal findings of blood chemistry: Secondary | ICD-10-CM

## 2018-03-03 DIAGNOSIS — R5383 Other fatigue: Secondary | ICD-10-CM | POA: Diagnosis not present

## 2018-03-03 DIAGNOSIS — E291 Testicular hypofunction: Secondary | ICD-10-CM

## 2018-03-03 HISTORY — DX: Cerebral cysts: G93.0

## 2018-03-03 HISTORY — DX: Shortness of breath: R06.02

## 2018-03-03 HISTORY — DX: Palpitations: R00.2

## 2018-03-03 IMAGING — DX DG CHEST 2V
2 series · 2 of 2 positions shown · non-contrast
Comparison: Chest x-ray of [DATE]

CLINICAL DATA: Recent onset of exertional shortness of breath.
History of dyslipidemia. Nonsmoker.

EXAM:
CHEST - 2 VIEW

[chest pa]
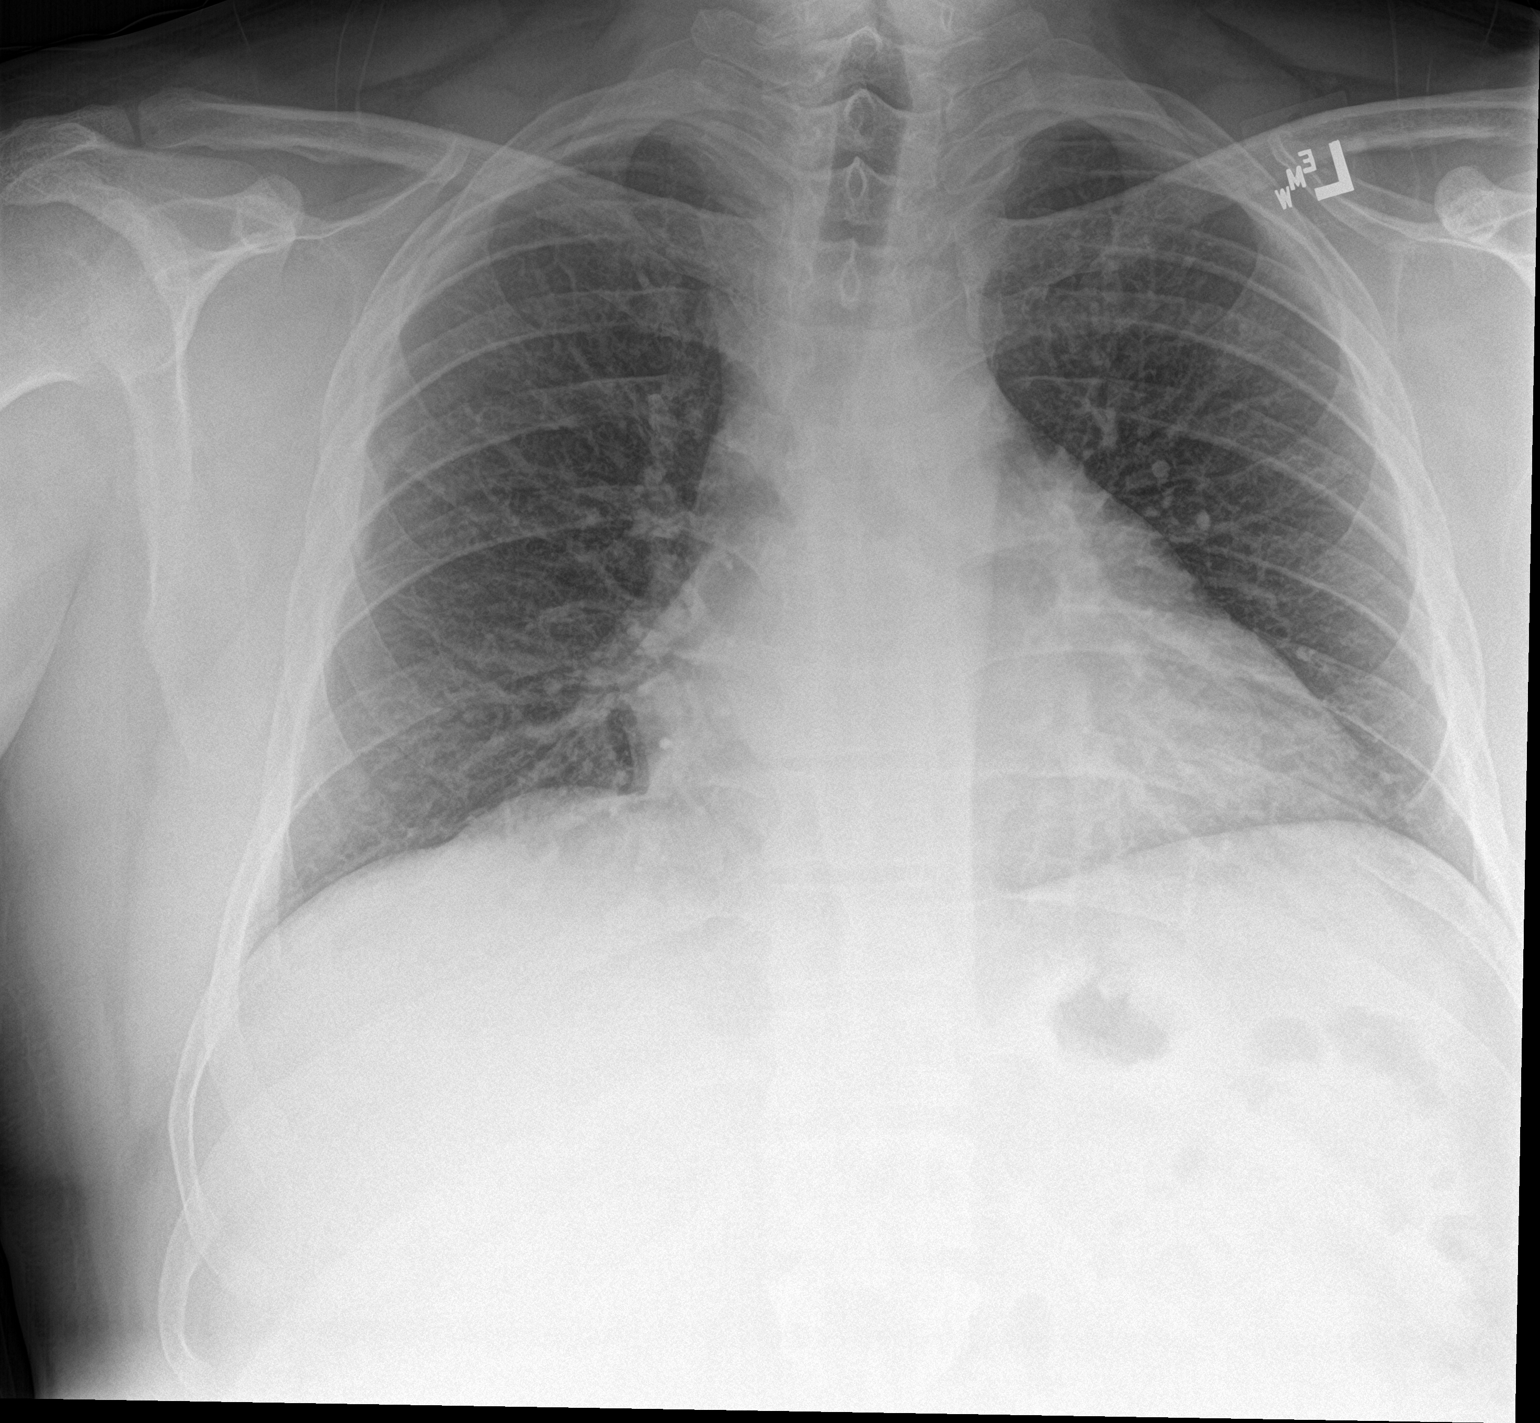

[chest lat]
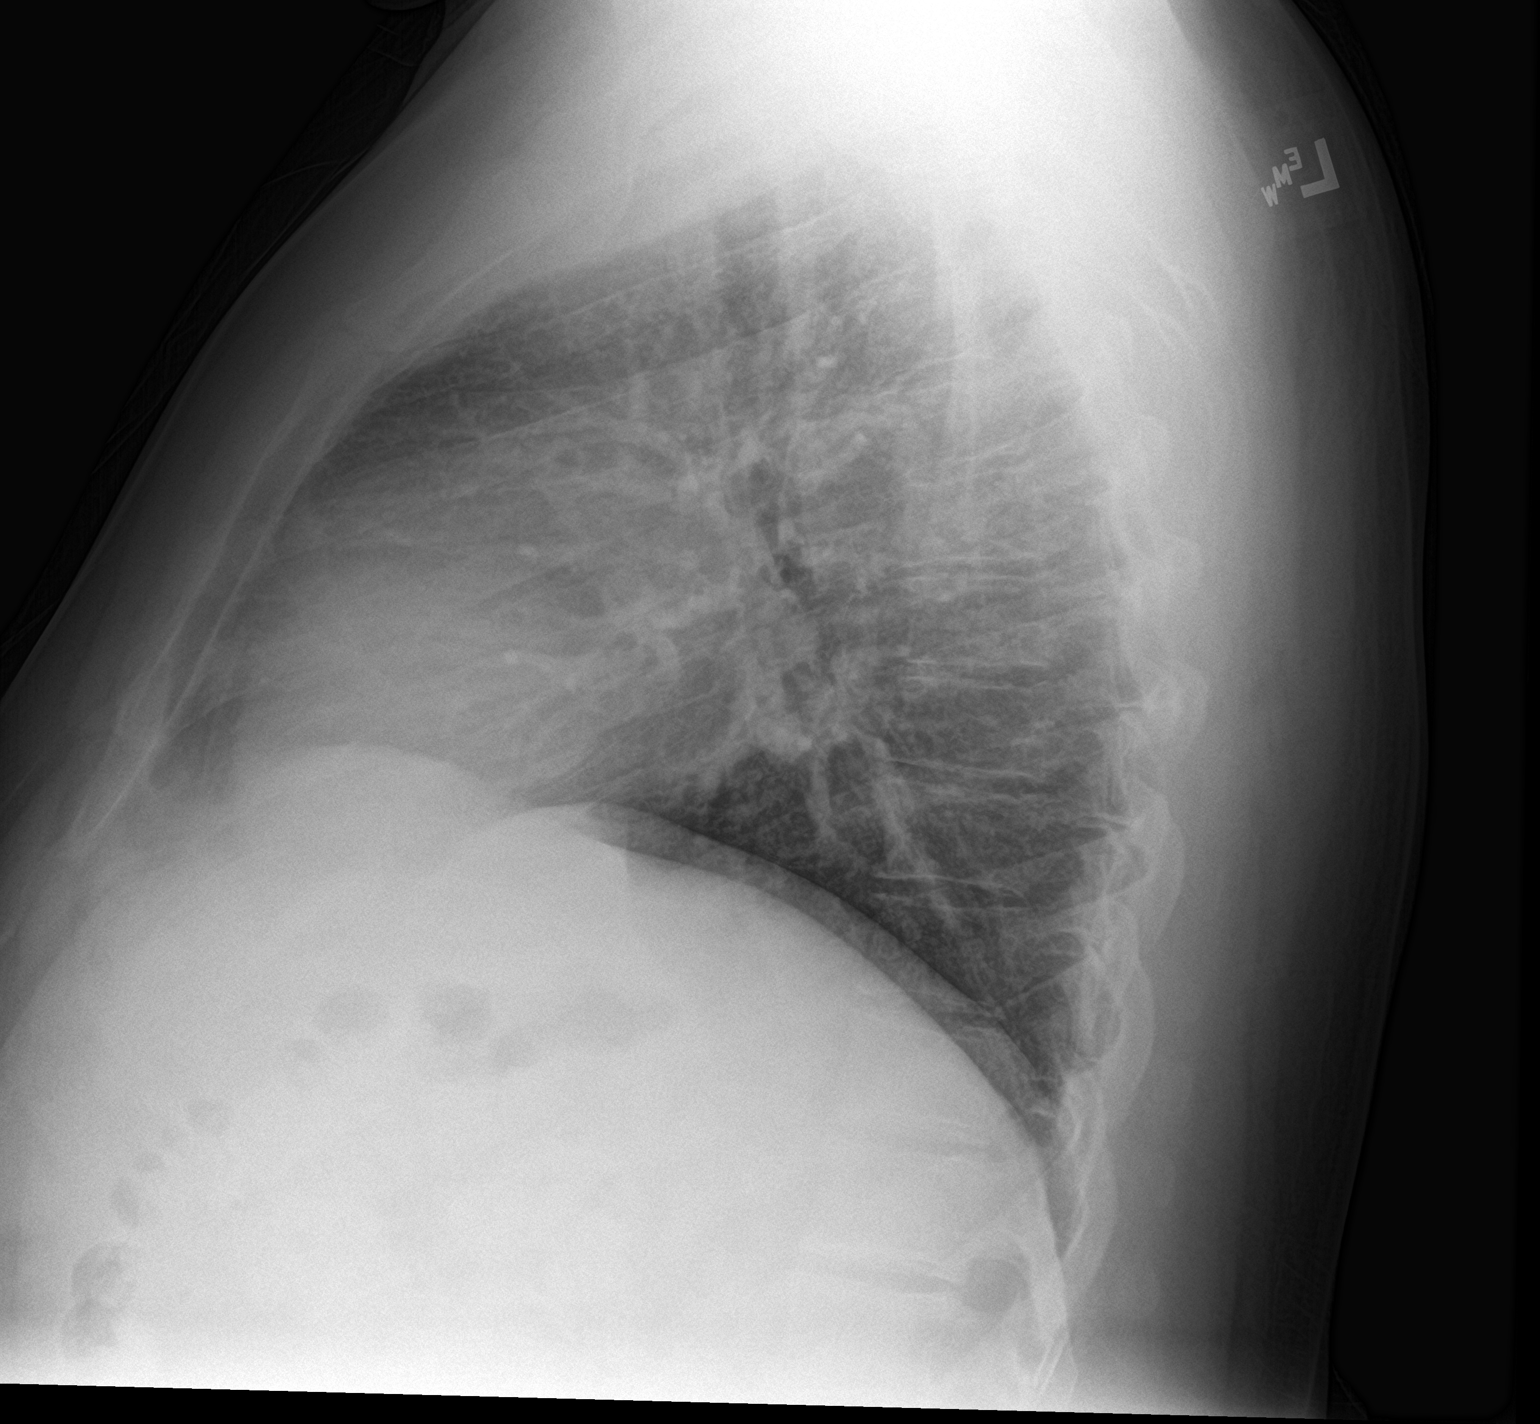

[2 of 2 positions shown; findings below may reference images not displayed]

FINDINGS: The lungs are adequately inflated. The interstitial markings are
mildly prominent though stable. The heart is top-normal in size. The
pulmonary vascularity is not engorged. The trachea is midline. The
bony thorax exhibits no acute abnormality.
IMPRESSION: Top-normal cardiac size without pulmonary vascular congestion or
pulmonary edema. Mild interstitial prominence of both lungs which is
chronic and may reflect chronic bronchitis.
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
## 2018-03-04 ENCOUNTER — Encounter: Payer: Self-pay | Admitting: Osteopathic Medicine

## 2018-03-04 LAB — LIPID PANEL
CHOLESTEROL: 279 mg/dL — AB (ref <200)
HDL: 41 mg/dL (ref >40)
LDL Cholesterol (Calc): 192 mg/dL (calc) — ABNORMAL HIGH
Non-HDL Cholesterol (Calc): 238 mg/dL (calc) — ABNORMAL HIGH (ref <130)
Total CHOL/HDL Ratio: 6.8 (calc) — ABNORMAL HIGH (ref <5.0)
Triglycerides: 256 mg/dL — ABNORMAL HIGH (ref <150)

## 2018-03-04 LAB — T4, FREE: Free T4: 1.1 ng/dL (ref 0.8–1.8)

## 2018-03-04 LAB — TESTOSTERONE: Testosterone: 225 ng/dL — ABNORMAL LOW (ref 250–827)

## 2018-03-04 LAB — TSH: TSH: 1.87 mIU/L (ref 0.40–4.50)

## 2018-03-04 NOTE — Addendum Note (Signed)
Addended by: Maryla Morrow on: 03/04/2018 08:21 AM   Modules accepted: Orders

## 2018-03-11 ENCOUNTER — Ambulatory Visit (INDEPENDENT_AMBULATORY_CARE_PROVIDER_SITE_OTHER): Payer: PRIVATE HEALTH INSURANCE | Admitting: Cardiology

## 2018-03-11 ENCOUNTER — Ambulatory Visit (HOSPITAL_BASED_OUTPATIENT_CLINIC_OR_DEPARTMENT_OTHER)
Admission: RE | Admit: 2018-03-11 | Discharge: 2018-03-11 | Disposition: A | Discharge status: Home / Self Care | Payer: PRIVATE HEALTH INSURANCE | Source: Ambulatory Visit | Attending: Osteopathic Medicine | Admitting: Osteopathic Medicine

## 2018-03-11 VITALS — BP 120/62 | HR 83 | Ht 72.0 in | Wt 266.0 lb

## 2018-03-11 DIAGNOSIS — E785 Hyperlipidemia, unspecified: Secondary | ICD-10-CM | POA: Insufficient documentation

## 2018-03-11 DIAGNOSIS — G4730 Sleep apnea, unspecified: Secondary | ICD-10-CM

## 2018-03-11 DIAGNOSIS — G4719 Other hypersomnia: Secondary | ICD-10-CM | POA: Insufficient documentation

## 2018-03-11 DIAGNOSIS — R011 Cardiac murmur, unspecified: Secondary | ICD-10-CM | POA: Diagnosis not present

## 2018-03-11 DIAGNOSIS — R0602 Shortness of breath: Secondary | ICD-10-CM | POA: Diagnosis not present

## 2018-03-11 DIAGNOSIS — R002 Palpitations: Secondary | ICD-10-CM | POA: Diagnosis present

## 2018-03-11 DIAGNOSIS — G4733 Obstructive sleep apnea (adult) (pediatric): Secondary | ICD-10-CM | POA: Diagnosis not present

## 2018-03-11 DIAGNOSIS — R400 Somnolence: Secondary | ICD-10-CM

## 2018-03-11 HISTORY — DX: Sleep apnea, unspecified: G47.30

## 2018-03-11 HISTORY — DX: Somnolence: R40.0

## 2018-03-11 LAB — ECHOCARDIOGRAM COMPLETE
HEIGHTINCHES: 72 in
WEIGHTICAEL: 4256 [oz_av]

## 2018-03-11 MED ORDER — ATORVASTATIN CALCIUM 10 MG PO TABS: 10.0000 mg | ORAL_TABLET | Freq: Every day | ORAL | 3 refills | Status: DC

## 2018-03-11 MED FILL — ATORVASTATIN 10 MG TABLET: 10 | 30 days supply | Qty: 30 | Fill #0
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
## 2018-03-11 NOTE — Patient Instructions (Addendum)
Medication Instructions:  Your physician has recommended you make the following change in your medication:   START atorvastatin (lipitor) 10 mg: Take 1 tablet daily   Labwork: None  Testing/Procedures: You had an EKG today.   Your physician has recommended that you wear a ZIO patch monitor. ZIO patch monitors are medical devices that record the heart's electrical activity. Doctors most often use these monitors to diagnose arrhythmias. Arrhythmias are problems with the speed or rhythm of the heartbeat. The monitor is a small, portable device. You can wear one while you do your normal daily activities. This is usually used to diagnose what is causing palpitations/syncope (passing out). Wear for 14 days.   Your physician has recommended that you have a sleep study. This test records several body functions during sleep, including: brain activity, eye movement, oxygen and carbon dioxide blood levels, heart rate and rhythm, breathing rate and rhythm, the flow of air through your mouth and nose, snoring, body muscle movements, and chest and belly movement.   Follow-Up: Your physician recommends that you schedule a follow-up appointment in: 1 month.   If you need a refill on your cardiac medications before your next appointment, please call your pharmacy.   Thank you for choosing [facility]! Robyne Peers, RN [phone]

## 2018-03-11 NOTE — Progress Notes (Signed)
  Echocardiogram 2D Echocardiogram has been performed.  Joshua Brady [name] [name] 03/11/2018, 3:23 PM

## 2018-03-16 ENCOUNTER — Telehealth: Payer: Self-pay | Admitting: *Deleted

## 2018-03-16 NOTE — Telephone Encounter (Signed)
Staff message sent to [facility] per Robin A. @ [employer] no PA is required. Okay to schedule. If patient wants to know cost he can call benefits office.

## 2018-03-16 NOTE — Telephone Encounter (Signed)
-----   Message from Austin Miles, RN sent at 03/11/2018 10:54 AM EDT ----- Regarding: Please precert Please precert this patient to have a sleep study due to daytime somnolence and snoring per Dr. Agustin Cree. Thanks!

## 2018-03-17 ENCOUNTER — Ambulatory Visit: Payer: PRIVATE HEALTH INSURANCE

## 2018-03-17 DIAGNOSIS — R002 Palpitations: Secondary | ICD-10-CM | POA: Diagnosis not present

## 2018-03-17 DIAGNOSIS — R0602 Shortness of breath: Secondary | ICD-10-CM | POA: Diagnosis not present

## 2018-03-17 NOTE — Telephone Encounter (Signed)
Left message with patient's sleep study appointment time and date.

## 2018-03-18 ENCOUNTER — Ambulatory Visit: Payer: PRIVATE HEALTH INSURANCE | Admitting: Osteopathic Medicine

## 2018-03-18 LAB — TESTOSTERONE TOTAL,FREE,BIO, MALES
Albumin: 4.5 g/dL (ref 3.6–5.1)
Sex Hormone Binding: 10 nmol/L (ref 10–50)
TESTOSTERONE: 183 ng/dL — AB (ref 250–827)

## 2018-03-18 LAB — PSA, TOTAL WITH REFLEX TO PSA, FREE: PSA, TOTAL: 0.4 ng/mL (ref <=4.0)

## 2018-03-18 LAB — FSH/LH
FSH: 1.3 m[IU]/mL — ABNORMAL LOW (ref 1.6–8.0)
LH: 2.5 m[IU]/mL (ref 1.5–9.3)

## 2018-03-24 ENCOUNTER — Encounter: Payer: Self-pay | Admitting: Osteopathic Medicine

## 2018-03-25 NOTE — Addendum Note (Signed)
Addended by: Maryla Morrow on: 03/25/2018 01:08 PM   Modules accepted: Orders

## 2018-03-30 ENCOUNTER — Ambulatory Visit (INDEPENDENT_AMBULATORY_CARE_PROVIDER_SITE_OTHER): Payer: PRIVATE HEALTH INSURANCE | Admitting: Osteopathic Medicine

## 2018-03-30 VITALS — BP 121/86 | HR 72 | Ht 72.0 in | Wt 268.0 lb

## 2018-03-30 DIAGNOSIS — R0602 Shortness of breath: Secondary | ICD-10-CM | POA: Diagnosis not present

## 2018-03-30 DIAGNOSIS — R7989 Other specified abnormal findings of blood chemistry: Secondary | ICD-10-CM | POA: Diagnosis not present

## 2018-03-30 DIAGNOSIS — R002 Palpitations: Secondary | ICD-10-CM | POA: Diagnosis not present

## 2018-03-30 DIAGNOSIS — R5383 Other fatigue: Secondary | ICD-10-CM | POA: Diagnosis not present

## 2018-03-30 MED ORDER — ALBUTEROL SULFATE (2.5 MG/3ML) 0.083% IN NEBU: 2.5000 mg | INHALATION_SOLUTION | Freq: Once | RESPIRATORY_TRACT | Status: DC

## 2018-03-30 NOTE — Patient Instructions (Signed)
Plan: Lung function testing today was inconclusive.  I think it is reasonable to wait and see what cardiology says about possibly a stress test, depending on the results of the sleep study and Holter monitor as well.  I will try to touch base with you after your appointments, or you can always call the office.  Please let me know if anything changes or gets worse.

## 2018-03-30 NOTE — Progress Notes (Signed)
Patient is present to clinic today for spirometry. Patient does not report having asthma, but does have SOB at times. Spirometry was completed with patient along with a nebulizer treatment, and results have been printed and given to PCP.

## 2018-03-30 NOTE — Progress Notes (Signed)
HPI: Joshua Brady is a 42 y.o. male who  has a past medical history of Borderline high cholesterol (01/25/2017), Dyslipidemia (high LDL; low HDL) (01/25/2017), Head injury (2016), and Heart murmur.  he presents to [facility] today, 03/30/18,  for chief complaint of:  Spirometry, SOB Testosterone deficiency Palpitations  Has been having some vague shortness of breath on exertion, feeling of chest tightness, spirometry today is inconclusive.  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      Past medical history, surgical history, and family history reviewed.  Current medication list and allergy/intolerance information reviewed.   (See remainder of HPI, ROS, Phys Exam below)    ASSESSMENT/PLAN:   SOB (shortness of breath) - Plan: PR EVAL OF BRONCHOSPASM, albuterol (PROVENTIL) (2.5 MG/3ML) 0.083% nebulizer solution 2.5 mg  Palpitations  Fatigue, unspecified type  Low testosterone in male   Meds ordered this encounter  Medications  . albuterol (PROVENTIL) (2.5 MG/3ML) 0.083% nebulizer solution 2.5 mg    Patient Instructions  Plan: Lung function testing today was inconclusive.  I think it is reasonable to wait and see what cardiology says about possibly a stress test, depending on the results of the sleep study and Holter monitor as well.  I will try to touch base with you after your appointments, or you can always call the office.  Please let me know if anything changes or gets worse.   Follow-up plan: Return for Recheck depending on cardiology visit/sleep  test.            ############################################ ############################################ ############################################ ############################################    Outpatient Encounter Medications as of 03/30/2018  Medication Sig  . AMBULATORY NON FORMULARY MEDICATION Medication Name: Hydroxycut  . atorvastatin (LIPITOR) 10 MG tablet Take 1 tablet (10 mg total) by mouth daily.   Facility-Administered Encounter Medications as of 03/30/2018  Medication  . albuterol (PROVENTIL) (2.5 MG/3ML) 0.083% nebulizer solution 2.5 mg   Allergies  Allergen Reactions  . Other     Ants - see notes from ER visit 02/2018  . Prednisone   . Topiramate     Anxiety, dizzy, depressed, suicidal thoughts, agitated      Review of Systems:  Constitutional: No recent illness  HEENT: No  headache, no vision change  Cardiac: No  chest pain, No  pressure, No palpitations  Respiratory:  +shortness of breath. No  Cough  Gastrointestinal: No  abdominal pain, no change on bowel habits  Musculoskeletal: No new myalgia/arthralgia  Neurologic: No  weakness, No  Dizziness  Psychiatric: + concerns with depression, No  concerns with anxiety  Exam:  BP 121/86   Pulse 72   Ht 6' (1.829 m)   Wt 268 lb (121.6 kg)   BMI 36.35 kg/m   Constitutional: VS see above. General Appearance: alert, well-developed, well-nourished, NAD  Eyes: Normal lids and conjunctive, non-icteric sclera  Ears, Nose, Mouth, Throat: MMM, Normal external inspection ears/nares/mouth/lips/gums.  Neck: No masses, trachea midline.   Respiratory: Normal respiratory effort. no wheeze, no rhonchi, no rales  Cardiovascular: S1/S2 normal, no murmur, no rub/gallop auscultated. RRR.   Musculoskeletal: Gait normal. Symmetric and independent movement of all extremities  Neurological: Normal balance/coordination. No tremor.  Skin: warm, dry, intact.   Psychiatric: Normal judgment/insight.  Normal mood and affect. Oriented x3.   Visit summary with medication list and pertinent instructions was printed for  patient to review, advised to alert [place] if any changes needed. All questions at time of visit were answered - patient instructed to contact office with any additional concerns. ER/RTC precautions were reviewed with the patient and understanding verbalized.   Follow-up plan: Return for Recheck depending on cardiology visit/sleep test.  Note: Total time spent 25 minutes, greater than 50% of the visit was spent face-to-face counseling and coordinating care for the following: The primary encounter diagnosis was SOB (shortness of breath). Diagnoses of Palpitations, Fatigue, unspecified type, and Low testosterone in male were also pertinent to this visit.Marland Kitchen  Please note: voice recognition software was used to produce this document, and typos may escape review. Please contact Dr. Sheppard Coil for any needed clarifications.

## 2018-04-08 MED FILL — ATORVASTATIN 10 MG TABLET: 10 | 30 days supply | Qty: 30 | Fill #1

## 2018-04-19 ENCOUNTER — Telehealth: Payer: Self-pay | Admitting: Emergency Medicine

## 2018-04-19 NOTE — Telephone Encounter (Signed)
Patient informed of results.  

## 2018-04-19 NOTE — Telephone Encounter (Signed)
Left message for patient to return call.

## 2018-04-21 ENCOUNTER — Ambulatory Visit (HOSPITAL_BASED_OUTPATIENT_CLINIC_OR_DEPARTMENT_OTHER): Payer: No Typology Code available for payment source | Attending: Cardiology | Admitting: Cardiovascular Disease

## 2018-04-21 VITALS — Ht 72.0 in | Wt 260.0 lb

## 2018-04-21 DIAGNOSIS — Z6835 Body mass index (BMI) 35.0-35.9, adult: Secondary | ICD-10-CM | POA: Diagnosis not present

## 2018-04-21 DIAGNOSIS — G4761 Periodic limb movement disorder: Secondary | ICD-10-CM | POA: Diagnosis not present

## 2018-04-21 DIAGNOSIS — E669 Obesity, unspecified: Secondary | ICD-10-CM | POA: Insufficient documentation

## 2018-04-21 DIAGNOSIS — Z79899 Other long term (current) drug therapy: Secondary | ICD-10-CM | POA: Diagnosis not present

## 2018-04-21 DIAGNOSIS — R0902 Hypoxemia: Secondary | ICD-10-CM | POA: Insufficient documentation

## 2018-04-21 DIAGNOSIS — G4736 Sleep related hypoventilation in conditions classified elsewhere: Secondary | ICD-10-CM | POA: Diagnosis not present

## 2018-04-21 DIAGNOSIS — G4730 Sleep apnea, unspecified: Secondary | ICD-10-CM

## 2018-04-21 DIAGNOSIS — G4733 Obstructive sleep apnea (adult) (pediatric): Secondary | ICD-10-CM

## 2018-04-21 DIAGNOSIS — R400 Somnolence: Secondary | ICD-10-CM | POA: Insufficient documentation

## 2018-04-22 ENCOUNTER — Encounter: Payer: Self-pay | Admitting: Cardiology

## 2018-04-22 ENCOUNTER — Ambulatory Visit (INDEPENDENT_AMBULATORY_CARE_PROVIDER_SITE_OTHER): Payer: PRIVATE HEALTH INSURANCE | Admitting: Cardiology

## 2018-04-22 VITALS — BP 130/70 | HR 86 | Ht 72.0 in | Wt 268.4 lb

## 2018-04-22 DIAGNOSIS — R002 Palpitations: Secondary | ICD-10-CM | POA: Diagnosis not present

## 2018-04-22 DIAGNOSIS — R0602 Shortness of breath: Secondary | ICD-10-CM

## 2018-04-22 DIAGNOSIS — E785 Hyperlipidemia, unspecified: Secondary | ICD-10-CM | POA: Diagnosis not present

## 2018-04-22 DIAGNOSIS — G4733 Obstructive sleep apnea (adult) (pediatric): Secondary | ICD-10-CM | POA: Diagnosis not present

## 2018-04-22 NOTE — Patient Instructions (Signed)
Medication Instructions:  Your physician recommends that you continue on your current medications as directed. Please refer to the Current Medication list given to you today.  If you need a refill on your cardiac medications before your next appointment, please call your pharmacy.   Lab work: None.  If you have labs (blood work) drawn today and your tests are completely normal, you will receive your results only by: . MyChart Message (if you have MyChart) OR . A paper copy in the mail If you have any lab test that is abnormal or we need to change your treatment, we will call you to review the results.  Testing/Procedures: None.   Follow-Up: At [facility], you and your health needs are our priority.  As part of our continuing mission to provide you with exceptional heart care, we have created designated Provider Care Teams.  These Care Teams include your primary Cardiologist (physician) and Advanced Practice Providers (APPs -  Physician Assistants and Nurse Practitioners) who all work together to provide you with the care you need, when you need it. You will need a follow up appointment in 6 months.  Please call our office 2 months in advance to schedule this appointment.  You may see Robert Krasowski, MD or another member of our [facility] Provider Team in [place]: Brian Munley, MD . Rajan Revankar, MD  Any Other Special Instructions Will Be Listed Below (If Applicable).     

## 2018-04-22 NOTE — Progress Notes (Signed)
Cardiology Office Note:    Date:  04/22/2018   ID:  Joshua Brady, DOB 10/11/1975, MRN [id number]  PCP:  Emeterio Reeve, DO  Cardiologist:  Jenne Campus, MD    Referring MD: Emeterio Reeve, DO   Chief Complaint  Patient presents with  . Follow up on monitor  Still have some palpitations but doing better  History of Present Illness:    Joshua Brady is a 42 y.o. male who was referred originally to me because of palpitations.  Echocardiogram was done which showed preserved left ventricular ejection fraction.  He did wear event recorder which shows some APCs and PVCs and very short run of narrow complex tachycardia longest episode was 5 beats.  He still complain of having lack of energy.  He did have a sleep study done last night obviously will wait for results of it.  He is also scheduled to see endocrinologist to talk about his low testosterone.  Denies have any chest pain tightness squeezing pressure burning chest.  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   Past Medical History:  Diagnosis Date  . Borderline high cholesterol 01/25/2017   10-yr ASCVD 4.1%  . Dyslipidemia (high LDL; low HDL) 01/25/2017   10-yr ASCVD risk 4.1% (01/2017)  . Head injury 2016  . Heart murmur     Past Surgical History:  Procedure Laterality Date  . Broken Arm    . LIVER BIOPSY      Current Medications: Current Meds  Medication Sig  . atorvastatin (LIPITOR) 10 MG tablet Take 1 tablet (10 mg total) by mouth daily.   Current Facility-Administered Medications for the 04/22/18 encounter (Office Visit) with Park Liter, MD  Medication  . albuterol (PROVENTIL) (2.5 MG/3ML) 0.083% nebulizer solution 2.5 mg     Allergies:   Other; Prednisone; and Topiramate   Social History   Socioeconomic History  . Marital status: Married    Spouse name: Not on file  . Number of children: Not on file  . Years of education: Not on file  . Highest  education level: Not on file  Occupational History  . Not on file  Social Needs  . Financial resource strain: Not on file  . Food insecurity:    Worry: Not on file    Inability: Not on file  . Transportation needs:    Medical: Not on file    Non-medical: Not on file  Tobacco Use  . Smoking status: Never Smoker  . Smokeless tobacco: Never Used  Substance and Sexual Activity  . Alcohol use: Yes  . Drug use: No  . Sexual activity: Yes    Birth control/protection: None  Lifestyle  . Physical activity:    Days per week: Not on file    Minutes per session: Not on file  . Stress: Not on file  Relationships  . Social connections:    Talks on phone: Not on file    Gets together: Not on file    Attends religious service: Not on file    Active member of club or organization: Not on file    Attends meetings of clubs or organizations: Not on file    Relationship status: Not on file  Other Topics Concern  . Not on file  Social History Narrative  . Not on file     Family History: The patient's family history includes Alcohol abuse in his father. ROS:   Please see the history  of present illness.    All 14 point review of systems negative except as described per history of present illness  EKGs/Labs/Other Studies Reviewed:      Recent Labs: 02/21/2018: ALT 66; BUN 16; Creatinine, Ser 0.90; Hemoglobin 13.8; Platelets 262; Potassium 3.9; Sodium 137 03/03/2018: TSH 1.87  Recent Lipid Panel    Component Value Date/Time   CHOL 279 (H) 03/03/2018 0914   TRIG 256 (H) 03/03/2018 0914   HDL 41 03/03/2018 0914   CHOLHDL 6.8 (H) 03/03/2018 0914   VLDL 45 (H) 01/21/2017 0859   LDLCALC 192 (H) 03/03/2018 0914    Physical Exam:    VS:  BP 130/70   Pulse 86   Ht 6' (1.829 m)   Wt 268 lb 6.4 oz (121.7 kg)   SpO2 96%   BMI 36.40 kg/m     Wt Readings from Last 3 Encounters:  04/22/18 268 lb 6.4 oz (121.7 kg)  04/21/18 260 lb (117.9 kg)  03/30/18 268 lb (121.6 kg)     GEN:   Well nourished, well developed in no acute distress HEENT: Normal NECK: No JVD; No carotid bruits LYMPHATICS: No lymphadenopathy CARDIAC: RRR, no murmurs, no rubs, no gallops RESPIRATORY:  Clear to auscultation without rales, wheezing or rhonchi  ABDOMEN: Soft, non-tender, non-distended MUSCULOSKELETAL:  No edema; No deformity  SKIN: Warm and dry LOWER EXTREMITIES: no swelling NEUROLOGIC:  Alert and oriented x 3 PSYCHIATRIC:  Normal affect   ASSESSMENT:    1. Palpitations   2. SOB (shortness of breath) on exertion   3. Dyslipidemia (high LDL; low HDL)   4. Obstructive sleep apnea syndrome    PLAN:    In order of problems listed above:  1. Palpitations APCs PVCs are very short run of nonsustained supraventricular tachycardia only up to 5 beats.  I offer him beta-blocker to decline we talked about avoiding caffeinated drinks as well as energy drinks.  I told him if he would like to have a treatment for this arrhythmia that he need to let me know and I will initiate small dose of beta-blocker. 2. Shortness of breath I recommended to start exercising on the regular basis Dyslipidemia taking statin which we will continue.  We will recheck fasting lipid profile Obstructive sleep apnea.  Did have sleep study last night awaiting results.   Medication Adjustments/Labs and Tests Ordered: Current medicines are reviewed at length with the patient today.  Concerns regarding medicines are outlined above.  No orders of the defined types were placed in this encounter.  Medication changes: No orders of the defined types were placed in this encounter.   Signed, Park Liter, MD, [facility] 04/22/2018 9:25 AM    [place]

## 2018-04-29 ENCOUNTER — Ambulatory Visit (INDEPENDENT_AMBULATORY_CARE_PROVIDER_SITE_OTHER): Payer: PRIVATE HEALTH INSURANCE | Admitting: Endocrinology

## 2018-04-29 ENCOUNTER — Encounter: Payer: Self-pay | Admitting: Endocrinology

## 2018-04-29 DIAGNOSIS — R7989 Other specified abnormal findings of blood chemistry: Secondary | ICD-10-CM

## 2018-04-29 HISTORY — DX: Other specified abnormal findings of blood chemistry: R79.89

## 2018-04-29 NOTE — Progress Notes (Signed)
Subjective:    Patient ID: Joshua Brady, male    DOB: 13-Jan-1976, 42 y.o.   MRN: [id number]  HPI Pt is referred by Dr Sheppard Coil, for hypogonadism.  Pt reports he had puberty at the normal age.  He has 2 biological children.  He says he has never taken illicit androgens.  He has never been on any prescribed medication for hypogonadism.  He does not take antiandrogens or opioids.  He denies any h/o infertility, XRT, or genital infection.  He has never had surgery to the head or genital area. He has no h/o sleep apnea or DVT.   He does not consume alcohol excessively.  He had a concussion in 2016.  He has slight doe, and assoc fatigue.  Wife has had TL Past Medical History:  Diagnosis Date  . Borderline high cholesterol 01/25/2017   10-yr ASCVD 4.1%  . Dyslipidemia (high LDL; low HDL) 01/25/2017   10-yr ASCVD risk 4.1% (01/2017)  . Head injury 2016  . Heart murmur     Past Surgical History:  Procedure Laterality Date  . Broken Arm    . LIVER BIOPSY      Social History   Socioeconomic History  . Marital status: Married    Spouse name: Not on file  . Number of children: Not on file  . Years of education: Not on file  . Highest education level: Not on file  Occupational History  . Not on file  Social Needs  . Financial resource strain: Not on file  . Food insecurity:    Worry: Not on file    Inability: Not on file  . Transportation needs:    Medical: Not on file    Non-medical: Not on file  Tobacco Use  . Smoking status: Never Smoker  . Smokeless tobacco: Never Used  Substance and Sexual Activity  . Alcohol use: Yes  . Drug use: No  . Sexual activity: Yes    Birth control/protection: None  Lifestyle  . Physical activity:    Days per week: Not on file    Minutes per session: Not on file  . Stress: Not on file  Relationships  . Social connections:    Talks on phone: Not on file    Gets together: Not on file    Attends religious service: Not on file    Active member of  club or organization: Not on file    Attends meetings of clubs or organizations: Not on file    Relationship status: Not on file  . Intimate partner violence:    Fear of current or ex partner: Not on file    Emotionally abused: Not on file    Physically abused: Not on file    Forced sexual activity: Not on file  Other Topics Concern  . Not on file  Social History Narrative  . Not on file    Current Outpatient Medications on File Prior to Visit  Medication Sig Dispense Refill  . atorvastatin (LIPITOR) 10 MG tablet Take 1 tablet (10 mg total) by mouth daily. 30 tablet 3   Current Facility-Administered Medications on File Prior to Visit  Medication Dose Route Frequency Provider Last Rate Last Dose  . albuterol (PROVENTIL) (2.5 MG/3ML) 0.083% nebulizer solution 2.5 mg  2.5 mg Nebulization Once Emeterio Reeve, DO        Allergies  Allergen Reactions  . Other     Ants - see notes from ER visit 02/2018  . Prednisone   .  Topiramate     Anxiety, dizzy, depressed, suicidal thoughts, agitated    Family History  Problem Relation Age of Onset  . Alcohol abuse Father   . Other Neg Hx        low testosterone    BP 124/72 (BP Location: Left Arm, Patient Position: Sitting, Cuff Size: Normal)   Pulse 73   Ht 6' (1.829 m)   Wt 273 lb 12.8 oz (124.2 kg)   SpO2 95%   BMI 37.13 kg/m     Review of Systems denies depression, numbness, erectile dysfunction, decreased urinary stream, gynecomastia, muscle weakness, fever, easy bruising, rash, blurry vision, rhinorrhea, and chest pain.  He has weight  And intermitt headache    Objective:   Physical Exam VS: see vs page GEN: no distress HEAD: head: no deformity eyes: no periorbital swelling, no proptosis external nose and ears are normal mouth: no lesion seen NECK: supple, thyroid is not enlarged CHEST WALL: no deformity LUNGS: clear to auscultation BREASTS:  No gynecomastia CV: reg rate and rhythm, no murmur ABD: abdomen is  soft, nontender.  no hepatosplenomegaly.  not distended.  no hernia GENITALIA:  Normal male.   MUSCULOSKELETAL: muscle bulk and strength are grossly normal.  no obvious joint swelling.  gait is normal and steady EXTEMITIES: No leg edema PULSES: no carotid bruit NEURO:  cn 2-12 grossly intact.   readily moves all 4's.  sensation is intact to touch on all 4's SKIN:  Normal texture and temperature.  No rash or suspicious lesion is visible.  Normal hair distribution NODES:  None palpable at the neck PSYCH: alert, well-oriented.  Does not appear anxious nor depressed.    Lab Results  Component Value Date   TESTOSTERONE 183 (L) 03/17/2018   Head CT: NAD  I have reviewed outside records, and summarized: Pt was noted to have low testosterone, and referred here.  He was ref to cardiol for palpitations.  He also reported sob      Assessment & Plan:  Low testosterone, new to me, uncertain etiology  Patient Instructions  blood tests are requested for you today.  We'll let you know about the results. Based on the results, I hope to prescribe for you a pill to increase the testosterone. Testosterone treatment has risks, including increased or decreased fertility (depending on the type of treatment), hair loss, prostate cancer, benign prostate enlargement, blood clots, liver problems, lower hdl ("good cholesterol"), polycythemia (opposite of anemia), sleep apnea, and behavior changes.

## 2018-04-29 NOTE — Patient Instructions (Addendum)
blood tests are requested for you today.  We'll let you know about the results. Based on the results, I hope to prescribe for you a pill to increase the testosterone. Testosterone treatment has risks, including increased or decreased fertility (depending on the type of treatment), hair loss, prostate cancer, benign prostate enlargement, blood clots, liver problems, lower hdl ("good cholesterol"), polycythemia (opposite of anemia), sleep apnea, and behavior changes.

## 2018-04-30 ENCOUNTER — Encounter (HOSPITAL_BASED_OUTPATIENT_CLINIC_OR_DEPARTMENT_OTHER): Payer: Self-pay | Admitting: Cardiovascular Disease

## 2018-04-30 LAB — PROLACTIN: PROLACTIN: 7.8 ng/mL (ref 2.0–18.0)

## 2018-04-30 NOTE — Procedures (Signed)
Patient Name: Joshua Brady, Joshua Brady Date: 04/21/2018 Gender: Male D.O.B: 1975-08-18 Age (years): 42 Referring Provider: Park Liter Height (inches): 72 Interpreting Physician: Shelva Majestic MD, ABS[facility] Weight (lbs): 260 RPSGT: Jorge Ny BMI: 35 MRN: [id number] Neck Size: 18.50  CLINICAL INFORMATION Sleep Study Type: NPSG  Indication for sleep study: Excessive Daytime Sleepiness, Fatigue, Obesity, OSA, Snoring  Epworth Sleepiness Score: 7  SLEEP STUDY TECHNIQUE As per the AASM Manual for the Scoring of Sleep and Associated Events v2.3 (April 2016) with a hypopnea requiring 4% desaturations.  The channels recorded and monitored were frontal, central and occipital EEG, electrooculogram (EOG), submentalis EMG (chin), nasal and oral airflow, thoracic and abdominal wall motion, anterior tibialis EMG, snore microphone, electrocardiogram, and pulse oximetry.  MEDICATIONS atorvastatin (LIPITOR) 10 MG tablet  Medications self-administered by patient taken the night of the study : N/A  SLEEP ARCHITECTURE The study was initiated at 10:20:32 PM and ended at 5:11:16 AM.  Sleep onset time was 28.7 minutes and the sleep efficiency was 62.4%%. The total sleep time was 256.5 minutes.  Stage REM latency was 294.0 minutes.  The patient spent 14.2%% of the night in stage N1 sleep, 80.1%% in stage N2 sleep, 0.0%% in stage N3 and 5.7% in REM.  Alpha intrusion was absent.  Supine sleep was 60.82%.  RESPIRATORY PARAMETERS The overall apnea/hypopnea index (AHI) was 3.3 per hour.  The respiratory disturbance index (RDI) was 11.9/h. There were 3 total apneas, including 3 obstructive, 0 central and 0 mixed apneas. There were 11 hypopneas and 37 RERAs.  The AHI during Stage REM sleep was 16.6 per hour.  AHI while supine was 4.6 per hour.  The mean oxygen saturation was 95.0%. The minimum SpO2 during sleep was 86.0%.  Loud snoring was noted during this study.  CARDIAC  DATA The 2 lead EKG demonstrated sinus rhythm. The mean heart rate was 65.0 beats per minute. Other EKG findings include: None.  LEG MOVEMENT DATA The total PLMS were 91 with a resulting PLMS index of 21.3. Associated arousal with leg movement index was 8.7 .  IMPRESSIONS - Increased upper airway resistance syndrome (AHI  3.3/h; but RDI was elevated at 11.9/h); however, there was was moderate sleep apnea during REM sleep (AHI 16.6/h). - No significant central sleep apnea occurred during this study (CAI = 0.0/h). - Mild oxygen desaturation to a nadir of 86.0%. - Reduced sleep efficiency. - Abnormal sleep architecture with absent slow wave sleep, prolonged latency to REM sleep and  reduction in REM sleep. - The patient snored with loud snoring volume. - No cardiac abnormalities were noted during this study. - Increased periodic limb movements of sleep with an index of 21.3. Associated arousals were significant.  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  Consider re-evaluation in future if symptoms persist.  - Effort should be made to optimize nasal and pharungeal patency. - If patient is symptomatic with restless legs consider pharmacotherapy for treatment of Periodic Leg Movements of Sleep. - Avoid alcohol, sedatives and other CNS depressants that may worsen sleep apnea and disrupt normal sleep architecture. - Sleep hygiene should be reviewed to assess factors that may improve sleep quality. - Weight management (BMI 35) and regular exercise should be initiated or continued  if appropriate.  [Electronically signed] 04/30/2018 08:53 AM  Shelva Majestic MD, [facility],  [place], American Board of  Sleep Medicine   NPI: [id number]  [place] PH: [phone]   FX: [phone] [place]

## 2018-05-01 ENCOUNTER — Other Ambulatory Visit: Payer: Self-pay | Admitting: Endocrinology

## 2018-05-01 DIAGNOSIS — R7989 Other specified abnormal findings of blood chemistry: Secondary | ICD-10-CM

## 2018-05-01 LAB — TESTOSTERONE,FREE AND TOTAL
TESTOSTERONE FREE: 6.1 pg/mL — AB (ref 6.8–21.5)
TESTOSTERONE: 212 ng/dL — AB (ref 264–916)

## 2018-05-01 MED ORDER — CLOMIPHENE CITRATE 50 MG PO TABS: ORAL_TABLET | ORAL | 5 refills | Status: DC

## 2018-05-02 ENCOUNTER — Telehealth: Payer: Self-pay | Admitting: *Deleted

## 2018-05-02 MED FILL — CLOMIPHENE CITRATE 50 MG TA: 50 | 32 days supply | Qty: 8 | Fill #0

## 2018-05-02 NOTE — Telephone Encounter (Signed)
Left message to return a call to discuss sleep study results and recommendations. 

## 2018-05-02 NOTE — Telephone Encounter (Signed)
-----   Message from Troy Sine, MD sent at 04/30/2018  9:00 AM EST ----- Mariann Laster, please notify pt the results of the sleep study. At present did not meet criteria for CPAP, but had moderate OSA during REM sleep, but had reduced REM sleep on the study. Consider oral appliance.

## 2018-05-03 NOTE — Telephone Encounter (Signed)
-----   Message from Troy Sine, MD sent at 04/30/2018  9:00 AM EST ----- Mariann Laster, please notify pt the results of the sleep study. At present did not meet criteria for CPAP, but had moderate OSA during REM sleep, but had reduced REM sleep on the study. Consider oral appliance.

## 2018-05-03 NOTE — Telephone Encounter (Signed)
Returned a call to patient. Informed him of his sleep study results and recommendations. He states that his wife is a Art therapist and he will check with her to see if her employer makes oral appliances, if not if he has a dentist he recommends. If he doesn't then he will go with our referral to Dr Ron Parker. Patient will call me back tomorrow.

## 2018-05-11 MED FILL — ATORVASTATIN 10 MG TABLET: 10 | 30 days supply | Qty: 30 | Fill #2

## 2018-06-01 MED FILL — CLOMIPHENE CITRATE 50 MG TA: 50 | 32 days supply | Qty: 8 | Fill #1

## 2018-06-17 MED FILL — ATORVASTATIN 10 MG TABLET: 10 | 30 days supply | Qty: 30 | Fill #3

## 2018-06-22 ENCOUNTER — Inpatient Hospital Stay: Payer: Self-pay | Admitting: Osteopathic Medicine

## 2018-07-01 MED FILL — CLOMIPHENE CITRATE 50 MG TA: 50 | 32 days supply | Qty: 8 | Fill #2

## 2018-07-18 ENCOUNTER — Other Ambulatory Visit: Payer: Self-pay | Admitting: Cardiology

## 2018-07-19 MED FILL — ATORVASTATIN 10 MG TABLET: 10 | 30 days supply | Qty: 30 | Fill #0

## 2018-07-29 MED FILL — CLOMIPHENE CITRATE 50 MG TA: 50 | 32 days supply | Qty: 8 | Fill #3

## 2018-08-26 MED FILL — ATORVASTATIN 10 MG TABLET: 10 | 30 days supply | Qty: 30 | Fill #1

## 2018-08-26 MED FILL — CLOMIPHENE CITRATE 50 MG TA: 50 | 32 days supply | Qty: 8 | Fill #4

## 2018-09-29 MED FILL — CLOMIPHENE CITRATE 50 MG TA: 50 | 32 days supply | Qty: 8 | Fill #5

## 2018-09-29 MED FILL — ATORVASTATIN 10 MG TABLET: 10 | 30 days supply | Qty: 30 | Fill #2

## 2018-10-04 ENCOUNTER — Encounter: Payer: Self-pay | Admitting: Osteopathic Medicine

## 2018-10-04 ENCOUNTER — Other Ambulatory Visit: Payer: Self-pay

## 2018-10-04 ENCOUNTER — Ambulatory Visit (INDEPENDENT_AMBULATORY_CARE_PROVIDER_SITE_OTHER): Payer: PRIVATE HEALTH INSURANCE

## 2018-10-04 ENCOUNTER — Ambulatory Visit (INDEPENDENT_AMBULATORY_CARE_PROVIDER_SITE_OTHER): Payer: PRIVATE HEALTH INSURANCE | Admitting: Osteopathic Medicine

## 2018-10-04 VITALS — BP 128/82 | HR 77 | Temp 98.1°F | Wt 277.9 lb

## 2018-10-04 DIAGNOSIS — R1012 Left upper quadrant pain: Secondary | ICD-10-CM | POA: Diagnosis not present

## 2018-10-04 LAB — POCT URINALYSIS DIPSTICK
Bilirubin, UA: NEGATIVE
Blood, UA: NEGATIVE
Glucose, UA: NEGATIVE
Ketones, UA: NEGATIVE
Leukocytes, UA: NEGATIVE
Nitrite, UA: NEGATIVE
Protein, UA: NEGATIVE
Spec Grav, UA: 1.02 (ref 1.010–1.025)
Urobilinogen, UA: 0.2 E.U./dL
pH, UA: 5.5 (ref 5.0–8.0)

## 2018-10-04 LAB — CBC WITH DIFFERENTIAL/PLATELET
Absolute Monocytes: 910 cells/uL (ref 200–950)
Basophils Absolute: 64 cells/uL (ref 0–200)
Basophils Relative: 0.6 %
Eosinophils Absolute: 342 cells/uL (ref 15–500)
Eosinophils Relative: 3.2 %
HCT: 43.5 % (ref 38.5–50.0)
Hemoglobin: 14.8 g/dL (ref 13.2–17.1)
Lymphs Abs: 3980 cells/uL — ABNORMAL HIGH (ref 850–3900)
MCH: 28.7 pg (ref 27.0–33.0)
MCHC: 34 g/dL (ref 32.0–36.0)
MCV: 84.5 fL (ref 80.0–100.0)
MPV: 11.4 fL (ref 7.5–12.5)
Monocytes Relative: 8.5 %
Neutro Abs: 5404 cells/uL (ref 1500–7800)
Neutrophils Relative %: 50.5 %
Platelets: 289 10*3/uL (ref 140–400)
RBC: 5.15 10*6/uL (ref 4.20–5.80)
RDW: 13.6 % (ref 11.0–15.0)
Total Lymphocyte: 37.2 %
WBC: 10.7 10*3/uL (ref 3.8–10.8)

## 2018-10-04 LAB — LIPASE: Lipase: 49 U/L (ref 7–60)

## 2018-10-04 LAB — COMPLETE METABOLIC PANEL WITH GFR
AG Ratio: 1.8 (calc) (ref 1.0–2.5)
ALT: 67 U/L — ABNORMAL HIGH (ref 9–46)
AST: 58 U/L — ABNORMAL HIGH (ref 10–40)
Albumin: 4.6 g/dL (ref 3.6–5.1)
Alkaline phosphatase (APISO): 65 U/L (ref 36–130)
BUN: 15 mg/dL (ref 7–25)
CO2: 26 mmol/L (ref 20–32)
Calcium: 9.5 mg/dL (ref 8.6–10.3)
Chloride: 104 mmol/L (ref 98–110)
Creat: 1.05 mg/dL (ref 0.60–1.35)
GFR, Est African American: 99 mL/min/{1.73_m2} (ref >=60)
GFR, Est Non African American: 85 mL/min/{1.73_m2} (ref >=60)
Globulin: 2.6 g/dL (calc) (ref 1.9–3.7)
Glucose, Bld: 77 mg/dL (ref 65–99)
Potassium: 4.1 mmol/L (ref 3.5–5.3)
Sodium: 138 mmol/L (ref 135–146)
Total Bilirubin: 0.5 mg/dL (ref 0.2–1.2)
Total Protein: 7.2 g/dL (ref 6.1–8.1)

## 2018-10-04 LAB — AMYLASE: Amylase: 102 U/L — ABNORMAL HIGH (ref 21–101)

## 2018-10-04 IMAGING — CT CT ABDOMEN AND PELVIS WITH CONTRAST
2 of 5 series · 17 of 46 positions shown, 19 images · IV contrast (APPLIED)
Comparison: None.

CLINICAL DATA: Acute left upper quadrant abdominal pain.

EXAM:
CT ABDOMEN AND PELVIS WITH CONTRAST
TECHNIQUE: Multidetector CT imaging of the abdomen and pelvis was performed
using the standard protocol following bolus administration of
intravenous contrast.
CONTRAST:  100mL [CQ] IOPAMIDOL ([CQ]) INJECTION 61%

[Series 2: axial st · axial · 0.98mm/px · z∈[-512,-27]mm · 14 of 111 slices shown, 16 images]
[im 7/111  soft-tissue]
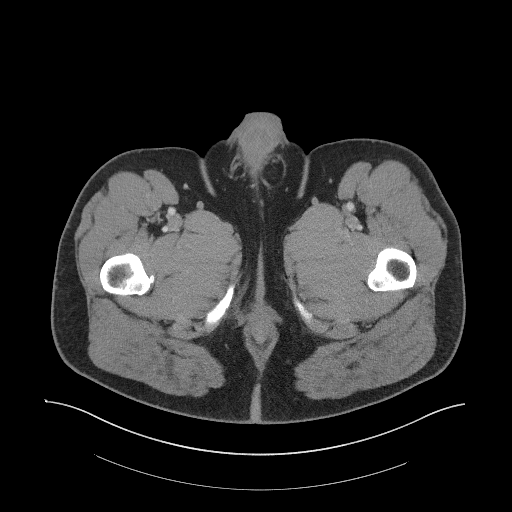
[im 7/111  bone]
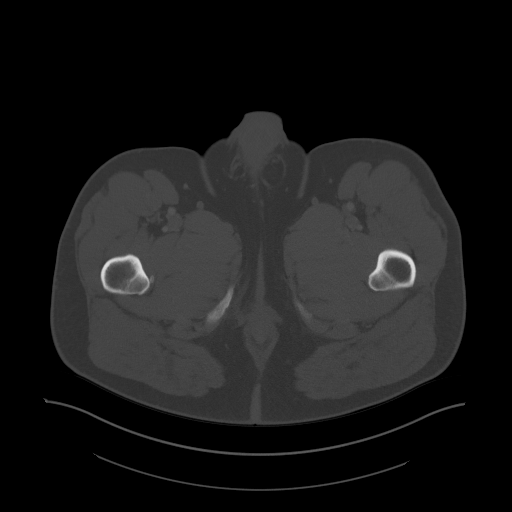
[im 13/111  soft-tissue]
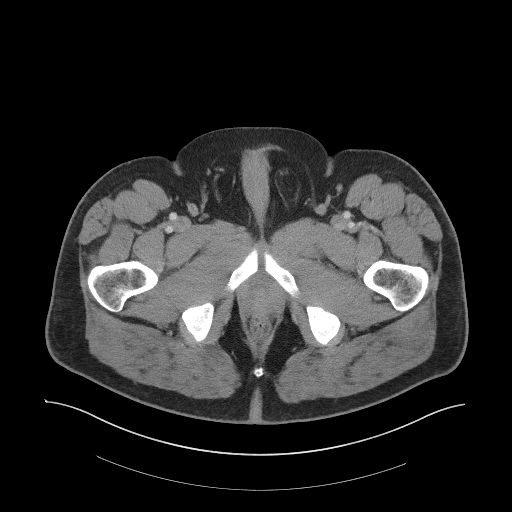
[im 20/111  soft-tissue]
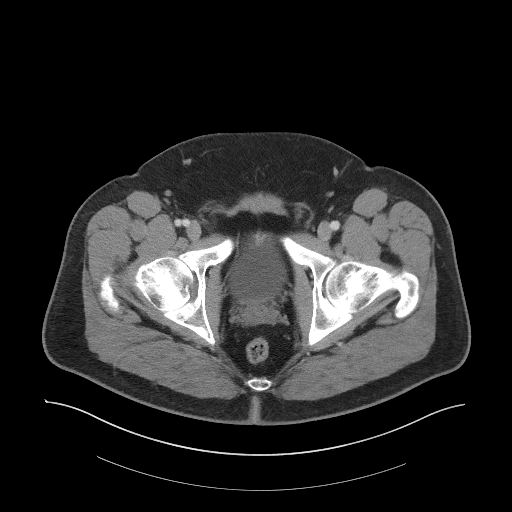
[im 33/111  soft-tissue]
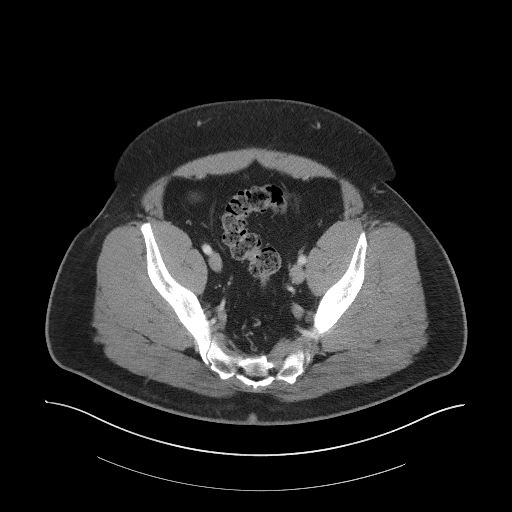
[im 39/111  soft-tissue]
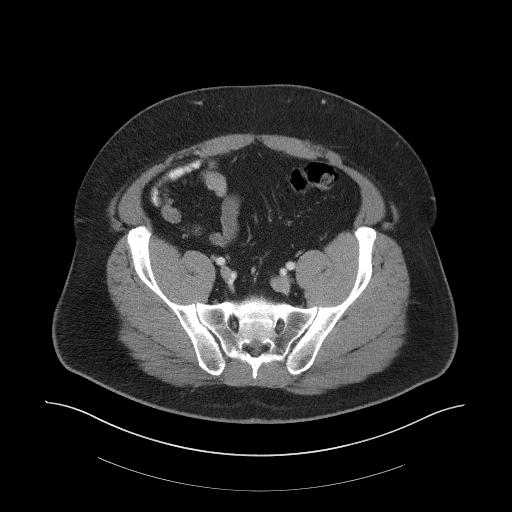
[im 46/111  soft-tissue]
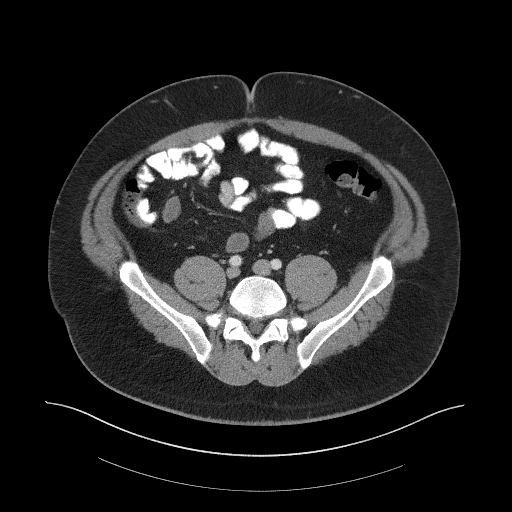
[im 52/111  soft-tissue]
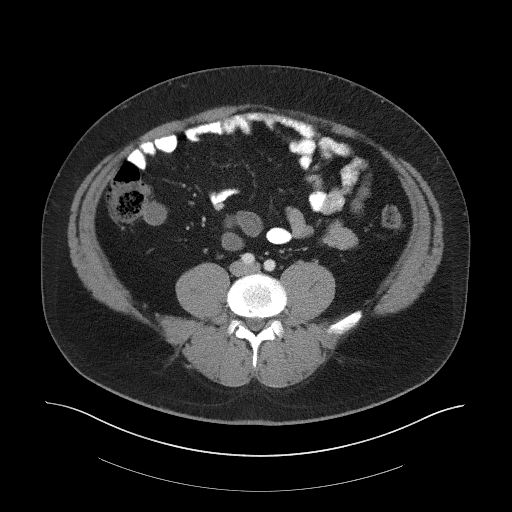
[im 59/111  soft-tissue]
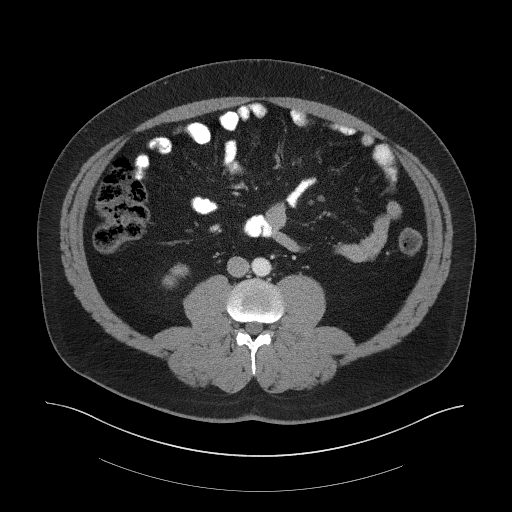
[im 65/111  soft-tissue]
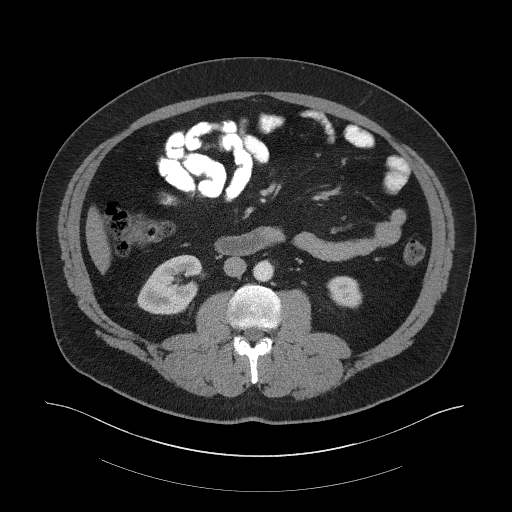
[im 65/111  bone]
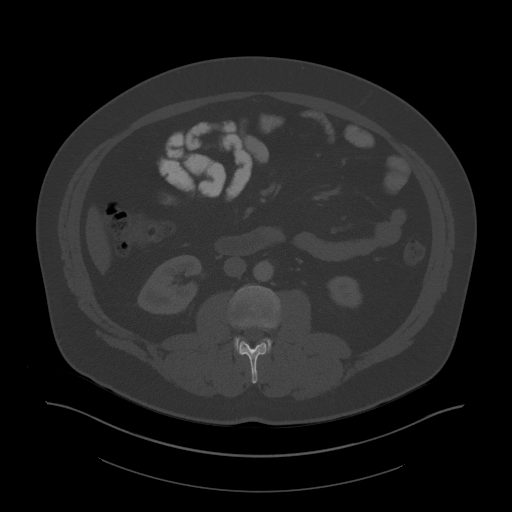
[im 72/111  soft-tissue]
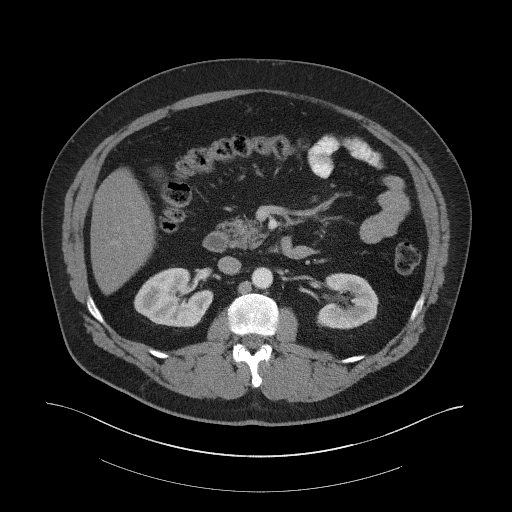
[im 85/111  soft-tissue]
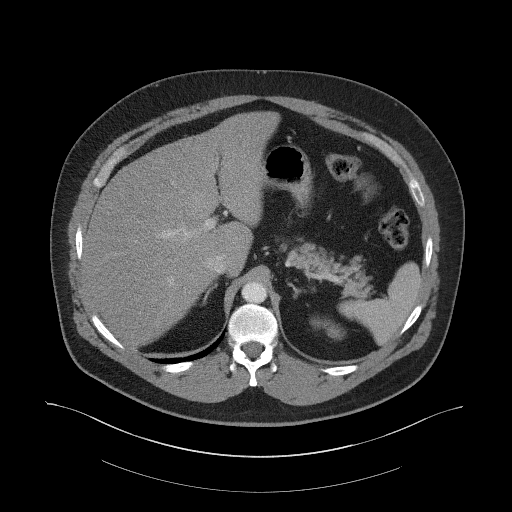
[im 91/111  soft-tissue]
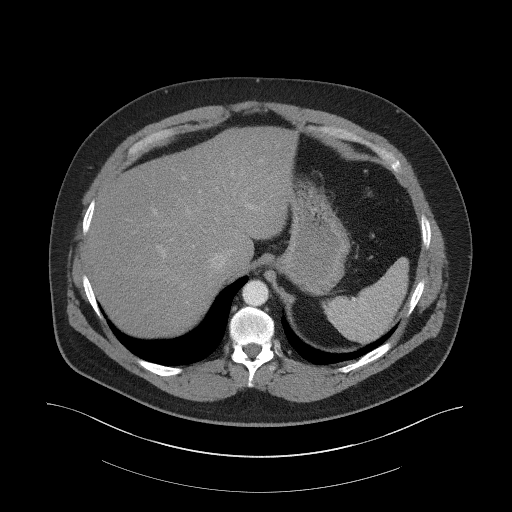
[im 98/111  soft-tissue]
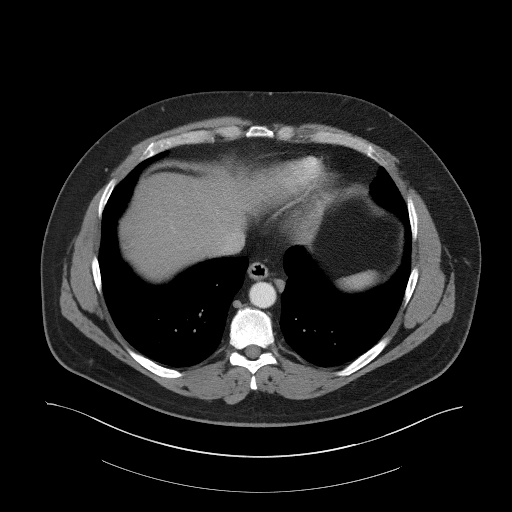
[im 104/111  soft-tissue]
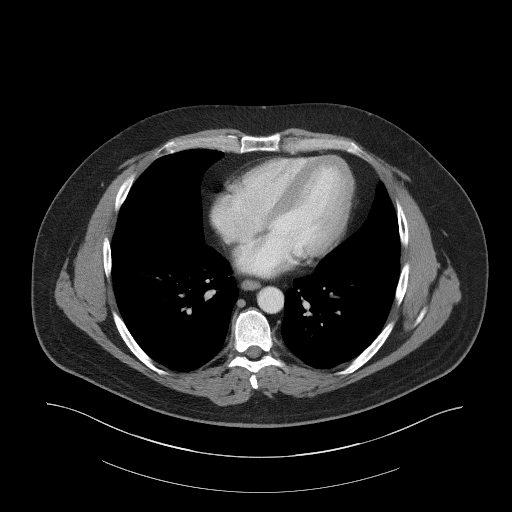

[Series 5: coronal st · coronal · 1.00mm/px · 3 of 121 slices shown]
[im 41/121  soft-tissue]
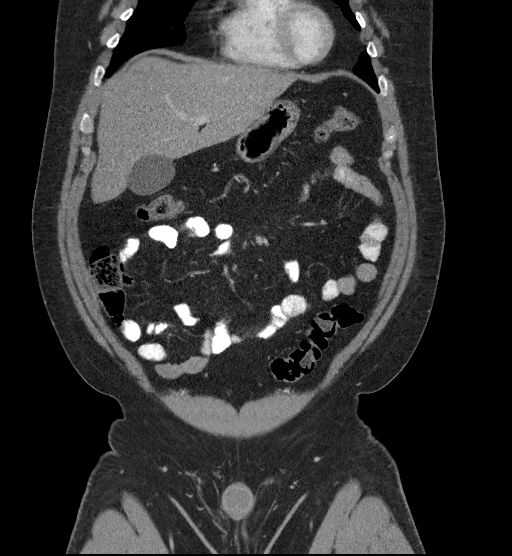
[im 54/121  soft-tissue]
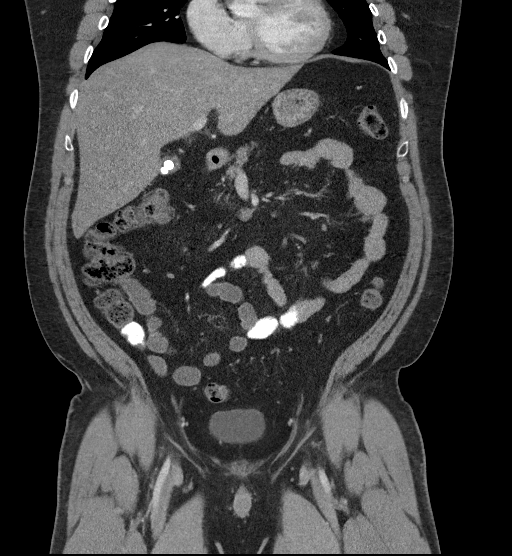
[im 67/121  soft-tissue]
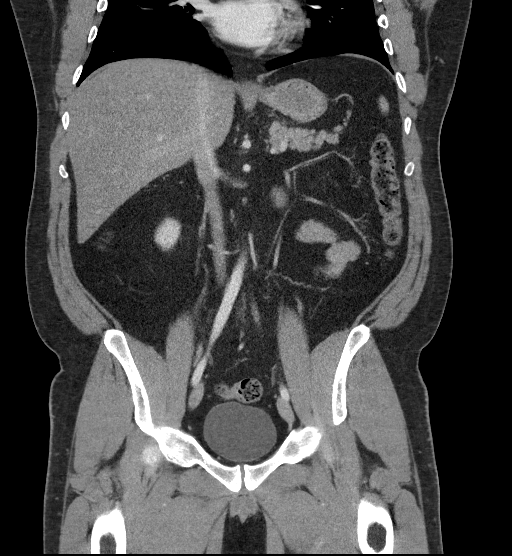

[17 of 46 positions shown; findings below may reference images not displayed]

FINDINGS: Lower chest: No acute abnormality.

Hepatobiliary: Hepatic steatosis. No focal liver abnormality. There
are several small gallstones. No gallbladder wall thickening or
biliary dilatation.

Pancreas: Unremarkable. No pancreatic ductal dilatation or
surrounding inflammatory changes.

Spleen: Normal in size without focal abnormality.

Adrenals/Urinary Tract: Adrenal glands are unremarkable. Kidneys are
normal, without renal calculi, focal lesion, or hydronephrosis.
Bladder is unremarkable.

Stomach/Bowel: Stomach is within normal limits. Appendix appears
normal. No evidence of bowel wall thickening, distention, or
inflammatory changes.

Vascular/Lymphatic: Minimal aortic atherosclerosis. No enlarged
abdominal or pelvic lymph nodes.

Reproductive: Prostate is unremarkable.

Other: Small fat containing bilateral inguinal hernias. No free
fluid or pneumoperitoneum.

Musculoskeletal: No acute or significant osseous findings.
IMPRESSION: 1.  No acute intra-abdominal process.
2. Hepatic steatosis.
3. Cholelithiasis.

## 2018-10-04 MED ORDER — IOPAMIDOL (ISOVUE-300) INJECTION 61%
100.0000 mL | Freq: Once | INTRAVENOUS | Status: AC | PRN
  Administered 2018-10-04: 17:00:00 100 mL via INTRAVENOUS

## 2018-10-04 NOTE — Progress Notes (Signed)
HPI: Joshua Brady is a 43 y.o. male who  has a past medical history of Borderline high cholesterol (01/25/2017), Dyslipidemia (high LDL; low HDL) (01/25/2017), Head injury (2016), and Heart murmur.  he presents to [facility] today, 10/04/18,  for chief complaint of: Abdominal pain   . Context: no recent travel, no hx food intolerance.  . Location: LUQ . Quality:bloating, tenderness, discomfort, "piercing" pain  . Duration: started this morning  . Timing: intermittent . Modifying factors:nothing makes better or worse . Assoc signs/symptoms: nausea, lightheadedness w/ sharp pain      Past medical, surgical, social and family history reviewed and updated as necessary.   Current medication list and allergy/intolerance information reviewed:    Current Outpatient Medications  Medication Sig Dispense Refill  . atorvastatin (LIPITOR) 10 MG tablet TAKE 1 TABLET (10 MG TOTAL) BY MOUTH DAILY. 30 tablet 3  . clomiPHENE (CLOMID) 50 MG tablet 1/4 tab daily 8 tablet 5   Current Facility-Administered Medications  Medication Dose Route Frequency Provider Last Rate Last Dose  . albuterol (PROVENTIL) (2.5 MG/3ML) 0.083% nebulizer solution 2.5 mg  2.5 mg Nebulization Once Emeterio Reeve, DO        Allergies  Allergen Reactions  . Other     Ants - see notes from ER visit 02/2018  . Prednisone   . Topiramate     Anxiety, dizzy, depressed, suicidal thoughts, agitated      Review of Systems:  Constitutional:  No  fever, no chills, No other recent illness, No unintentional weight changes. No significant fatigue.   HEENT: No  headache, no vision change, no hearing change, No sore throat, No  sinus pressure  Cardiac: No  chest pain, No  pressure, No palpitations  Respiratory:  No  shortness of breath. No  Cough  Gastrointestinal: +abdominal pain, +nausea, No  vomiting,  No  blood in stool, No  diarrhea  Musculoskeletal: No new myalgia/arthralgia   Skin: No  Rash  Neurologic: No  weakness, No  dizziness  Exam:  BP 128/82 (BP Location: Right Arm, Patient Position: Sitting, Cuff Size: Large)   Pulse 77   Temp 98.1 F (36.7 C) (Oral)   Wt 277 lb 14.4 oz (126.1 kg)   BMI 37.69 kg/m   Constitutional: VS see above. General Appearance: alert, well-developed, well-nourished, NAD  Eyes: Normal lids and conjunctive, non-icteric sclera  Ears, Nose, Mouth, Throat: MMM, Normal external inspection ears/nares/mouth/lips/gums.   Neck: No masses, trachea midline. No tenderness/mass appreciated. No lymphadenopathy  Respiratory: Normal respiratory effort. no wheeze, no rhonchi, no rales  Cardiovascular: S1/S2 normal, no murmur, no rub/gallop auscultated. RRR. No lower extremity edema.   Gastrointestinal: (+)TTP LUQ but  No rebound or guarding, no distension, no masses. Bowel sounds normal.  Musculoskeletal: Gait normal.   Neurological: Normal balance/coordination. No tremor.   Skin: warm, dry, intact.   Psychiatric: Normal judgment/insight. Normal mood and affect.   Results for orders placed or performed in visit on 10/04/18 (from the past 72 hour(s))  POCT Urinalysis Dipstick     Status: None   Collection Time: 10/04/18  2:37 PM  Result Value Ref Range   Color, UA Yellow    Clarity, UA Clear    Glucose, UA Negative Negative   Bilirubin, UA Negative    Ketones, UA Negative    Spec Grav, UA 1.020 1.010 - 1.025   Blood, UA Negative    pH, UA 5.5 5.0 - 8.0   Protein, UA Negative Negative  Urobilinogen, UA 0.2 0.2 or 1.0 E.U./dL   Nitrite, UA Negative    Leukocytes, UA Negative Negative   Appearance     Odor    CBC with Differential/Platelet     Status: Abnormal   Collection Time: 10/04/18  3:02 PM  Result Value Ref Range   WBC 10.7 3.8 - 10.8 Thousand/uL   RBC 5.15 4.20 - 5.80 Million/uL   Hemoglobin 14.8 13.2 - 17.1 g/dL   HCT 43.5 38.5 - 50.0 %   MCV 84.5 80.0 - 100.0 fL   MCH 28.7 27.0 - 33.0 pg   MCHC 34.0 32.0 -  36.0 g/dL   RDW 13.6 11.0 - 15.0 %   Platelets 289 140 - 400 Thousand/uL   MPV 11.4 7.5 - 12.5 fL   Neutro Abs 5,404 1,500 - 7,800 cells/uL   Lymphs Abs 3,980 (H) 850 - 3,900 cells/uL   Absolute Monocytes 910 200 - 950 cells/uL   Eosinophils Absolute 342 15 - 500 cells/uL   Basophils Absolute 64 0 - 200 cells/uL   Neutrophils Relative % 50.5 %   Total Lymphocyte 37.2 %   Monocytes Relative 8.5 %   Eosinophils Relative 3.2 %   Basophils Relative 0.6 %  COMPLETE METABOLIC PANEL WITH GFR     Status: Abnormal   Collection Time: 10/04/18  3:02 PM  Result Value Ref Range   Glucose, Bld 77 65 - 99 mg/dL    Comment: .            Fasting reference interval .    BUN 15 7 - 25 mg/dL   Creat 1.05 0.60 - 1.35 mg/dL   GFR, Est Non African American 85 > OR = 60 mL/min/1.43m2   GFR, Est African American 99 > OR = 60 mL/min/1.48m2   BUN/Creatinine Ratio NOT APPLICABLE 6 - 22 (calc)   Sodium 138 135 - 146 mmol/L   Potassium 4.1 3.5 - 5.3 mmol/L   Chloride 104 98 - 110 mmol/L   CO2 26 20 - 32 mmol/L   Calcium 9.5 8.6 - 10.3 mg/dL   Total Protein 7.2 6.1 - 8.1 g/dL   Albumin 4.6 3.6 - 5.1 g/dL   Globulin 2.6 1.9 - 3.7 g/dL (calc)   AG Ratio 1.8 1.0 - 2.5 (calc)   Total Bilirubin 0.5 0.2 - 1.2 mg/dL   Alkaline phosphatase (APISO) 65 36 - 130 U/L   AST 58 (H) 10 - 40 U/L   ALT 67 (H) 9 - 46 U/L  Lipase     Status: None   Collection Time: 10/04/18  3:02 PM  Result Value Ref Range   Lipase 49 7 - 60 U/L  Amylase     Status: Abnormal   Collection Time: 10/04/18  3:02 PM  Result Value Ref Range   Amylase 102 (H) 21 - 101 U/L    Ct Abdomen Pelvis W Contrast  Result Date: 10/04/2018 CLINICAL DATA:  Acute left upper quadrant abdominal pain. EXAM: CT ABDOMEN AND PELVIS WITH CONTRAST TECHNIQUE: Multidetector CT imaging of the abdomen and pelvis was performed using the standard protocol following bolus administration of intravenous contrast. CONTRAST:  138mL ISOVUE-300 IOPAMIDOL (ISOVUE-300)  INJECTION 61% COMPARISON:  None. FINDINGS: Lower chest: No acute abnormality. Hepatobiliary: Hepatic steatosis. No focal liver abnormality. There are several small gallstones. No gallbladder wall thickening or biliary dilatation. Pancreas: Unremarkable. No pancreatic ductal dilatation or surrounding inflammatory changes. Spleen: Normal in size without focal abnormality. Adrenals/Urinary Tract: Adrenal glands are unremarkable. Kidneys are normal, without  renal calculi, focal lesion, or hydronephrosis. Bladder is unremarkable. Stomach/Bowel: Stomach is within normal limits. Appendix appears normal. No evidence of bowel wall thickening, distention, or inflammatory changes. Vascular/Lymphatic: Minimal aortic atherosclerosis. No enlarged abdominal or pelvic lymph nodes. Reproductive: Prostate is unremarkable. Other: Small fat containing bilateral inguinal hernias. No free fluid or pneumoperitoneum. Musculoskeletal: No acute or significant osseous findings. IMPRESSION: 1.  No acute intra-abdominal process. 2. Hepatic steatosis. 3. Cholelithiasis. Electronically Signed   By: Titus Dubin M.D.   On: 10/04/2018 17:06     ASSESSMENT/PLAN: The encounter diagnosis was Left upper quadrant pain.   Called to check up on him 10/06/18 4:29 PM reports some soreness still there but improved from what it was. Advised to call if worse/change, or if not resolved by early next week.   No orders of the defined types were placed in this encounter.   Orders Placed This Encounter  Procedures  . POCT Urinalysis Dipstick    Patient Instructions  If this is something like a colitis or pancreatitis, recommend clear liquid diet and slowly progress to full liquid then bland as you're improving.   Will have some more information as I get results back. In the meantime, rest and stay hydrated!         Clear Liquid Diet, Adult A clear liquid diet is a diet that includes only liquids and semi-liquids that you can see  through. You do not eat any food on this diet. Most people need to follow this diet for only a short period of time. You may need to follow a clear liquid diet if:  You develop a medical condition right before or after you have surgery.  You were not able to eat food for a long period of time.  You had a condition that gave you nausea, vomiting, or diarrhea.  You are going to have a procedure or exam to look at parts of your digestive system.  You are going to have bowel surgery. The usual goals of this diet are:  To rest the stomach and digestive system as much as possible.  To help you clear the digestive system before a procedure or exam.  To keep you hydrated.  To make sure you get some calories for energy.  To help you return to your normal eating routine. What are tips for following this plan?  A clear liquid is a liquid or semi-liquid, such as gelatin, that you can see through when you hold it up to a light.  A clear liquid diet does not provide all the nutrients that you need. It is important to choose a variety of the liquids or semi-liquids that are allowed on this diet. That way, you will get as many nutrients as possible.  If you are not sure whether you can have certain items, ask your health care provider. If you have difficulty swallowing thin liquids, you will need to thicken them to prevent breathing in (aspiration). What foods should I eat?   Water and flavored water.  Fruit juices that do not have pulp, such as cranberry juice and apple juice.  Tea and coffee without milk or cream.  Clear bouillon or broth.  Broth-based soups that have been strained.  Flavored gelatin.  Honey.  Sugar water.  Ice or frozen ice pops that do not contain milk, yogurt, fruit pieces, or fruit pulp.  Clear sodas.  Clear sports drinks. The items listed above may not be a complete list of recommended foods and beverages. Contact  a dietitian for more information. What  food should I avoid?  Juices that have pulp.  Milk.  Cream or cream-based soups.  Yogurt.  Regular foods that are not clear liquids or semi-liquids. The items listed above may not be a complete list of foods and beverages to avoid. Contact a dietitian for more information. Questions to ask your health care provider:  How long do I need to follow this diet?  Are there any medicines that I should change while on this diet? Summary  A clear liquid diet is a diet that includes liquids and semi-liquids that you can see through.  The goal of this diet is to help you recover by resting your digestive system, keeping you hydrated, and providing nutrients.  Avoid liquids with milk, cream, or pulp while on this diet. This information is not intended to replace advice given to you by your health care provider. Make sure you discuss any questions you have with your health care provider. Document Released: 06/01/2005 Document Revised: 11/22/2017 Document Reviewed: 11/22/2017 Elsevier Interactive Patient Education  2019 [employer]     Full Liquid Diet A full liquid diet refers to fluids and foods that are liquid or will become liquid at room temperature. This diet should only be used for a short period of time to help you recover from illness or surgery. Your health care provider or dietitian will help you determine when it is safe to eat regular foods. What are tips for following this plan?     Reading food labels  Check food labels of nutrition shakes for the amount of protein. Look for nutrition shakes that have at least 8-10 grams of protein in each serving.  Look for drinks, such as milks and juices, that are "fortified" or "enriched." This means that vitamins and minerals have been added. Shopping  Buy premade nutritional shakes to keep on hand.  To vary your choices, buy different flavors of milks and shakes. Meal planning  Choose flavors and foods that you enjoy.  To  make sure you get enough energy from food (calories): ? Eat 3 full liquid meals each day. Have a liquid snack between each meal. ? Drink 6-8 ounces (177-237 ml) of a nutrition supplement shake with meals or as snacks. ? Add protein powder, powdered milk, milk, or yogurt to shakes to increase the amount of protein.  Drink at least one serving a day of citrus fruit juice or fruit juice that has vitamin C added. General guidelines  Before starting the full-liquid diet, check with your health care provider to know what foods you should avoid. These may include full-fat or high-fiber liquids.  You may have any liquid or food that becomes a liquid at room temperature. The food is considered a liquid if it can be poured off a spoon at room temperature.  Do not drink alcohol unless approved by your health care provider.  This diet gives you most of the nutrients that you need for energy, but you may not get enough of certain vitamins, minerals, and fiber. Make sure to talk to your health care provider or dietitian about: ? How many calories you need to eat get day. ? How much fluid you should have each day. ? Taking a multivitamin or a nutritional supplement. What foods are allowed? The items listed may not be a complete list. Talk with your dietitian about what dietary choices are best for you. Grains Thin hot cereal, such as cream of wheat. Soft-cooked pasta or  rice pured in soup. Vegetables Pulp-free tomato or vegetable juice. Vegetables pured in soup. Fruits Fruit juice without pulp. Strained fruit pures (seeds and skins removed). Meats and other protein foods Beef, chicken, and fish broths. Powdered protein supplements. Dairy Milk and milk-based beverages, including milk shakes and instant breakfast mixes. Smooth yogurt. Pured cottage cheese. Beverages Water. Coffee and tea (caffeinated or decaffeinated). Cocoa. Liquid nutritional supplements. Soft drinks. Nondairy milks, such as  almond, coconut, rice, or soy milk. Fats and oils Melted margarine and butter. Cream. Canola, almond, avocado, corn, grapeseed, sunflower, and sesame oils. Gravy. Sweets and desserts Custard. Pudding. Flavored gelatin. Smooth ice cream (without nuts or candy pieces). Sherbet. Popsicles. [place] ice. Pudding pops. Seasoning and other foods Salt and pepper. Spices. Cocoa powder. Vinegar. Ketchup. Yellow mustard. Smooth sauces, such as [place], cheese sauce, or white sauce. Soy sauce. Cream soups. Strained soups. Syrup. Honey. Jelly (without fruit pieces). What foods are not allowed? The items listed may not be a complete list. Talk with your dietitian about what dietary choices are best for you. Grains Whole grains. Pasta. Rice. Cold cereal. Bread. Crackers. Vegetables All whole fresh, frozen, or canned vegetables. Fruits All whole fresh, frozen, or canned fruits. Meats and other protein foods All cuts of meat, poultry, and fish. Eggs. Tofu and soy protein. Nuts and nut butters. Lunch meat. Sausage. Dairy Hard cheese. Yogurt with fruit chunks. Fats and oils Coconut oil. Palm oil. Lard. Cold butter. Sweets and desserts Ice cream or other frozen desserts that have any solids in them or on top, such as nuts, chocolate chips, and pieces of cookies. Cakes. Cookies. Candy. Seasoning and other foods Stone-ground mustards. Soups with chunks or pieces. Summary  A full liquid diet refers to fluids and foods that are liquid or will become liquid at room temperature.  This diet should only be used for a short period of time to help you recover from illness or surgery. Ask your health care provider or dietitian when it is safe for you to eat regular foods.  To make sure you get enough calories and nutrients, eat 3 meals each day with snacks between. Drink premade nutrition supplement shakes or add protein powder to homemade shakes. Take a vitamin and mineral supplement as told by your health care  provider. This information is not intended to replace advice given to you by your health care provider. Make sure you discuss any questions you have with your health care provider. Document Released: 06/01/2005 Document Revised: 07/15/2016 Document Reviewed: 07/15/2016 Elsevier Interactive Patient Education  2019 [place] Diet A bland diet consists of foods that are often soft and do not have a lot of fat, fiber, or extra seasonings. Foods without fat, fiber, or seasoning are easier for the body to digest. They are also less likely to irritate your mouth, throat, stomach, and other parts of your digestive system. A bland diet is sometimes called a BRAT diet. What is my plan? Your health care provider or food and nutrition specialist (dietitian) may recommend specific changes to your diet to prevent symptoms or to treat your symptoms. These changes may include:  Eating small meals often.  Cooking food until it is soft enough to chew easily.  Chewing your food well.  Drinking fluids slowly.  Not eating foods that are very spicy, sour, or fatty.  Not eating citrus fruits, such as oranges and grapefruit. What do I need to know about this diet?  Eat a variety of foods from  the bland diet food list.  Do not follow a bland diet longer than needed.  Ask your health care provider whether you should take vitamins or supplements. What foods can I eat? Grains  Hot cereals, such as cream of wheat. Rice. Bread, crackers, or tortillas made from refined white flour. Vegetables Canned or cooked vegetables. Mashed or boiled potatoes. Fruits  Bananas. Applesauce. Other types of cooked or canned fruit with the skin and seeds removed, such as canned peaches or pears. Meats and other proteins  Scrambled eggs. Creamy peanut butter or other nut butters. Lean, well-cooked meats, such as chicken or fish. Tofu. Soups or broths. Dairy Low-fat dairy products, such as milk, cottage  cheese, or yogurt. Beverages  Water. Herbal tea. Apple juice. Fats and oils Mild salad dressings. Canola or olive oil. Sweets and desserts Pudding. Custard. Fruit gelatin. Ice cream. The items listed above may not be a complete list of recommended foods and beverages. Contact a dietitian for more options. What foods are not recommended? Grains Whole grain breads and cereals. Vegetables Raw vegetables. Fruits Raw fruits, especially citrus, berries, or dried fruits. Dairy Whole fat dairy foods. Beverages Caffeinated drinks. Alcohol. Seasonings and condiments Strongly flavored seasonings or condiments. Hot sauce. Salsa. Other foods Spicy foods. Fried foods. Sour foods, such as pickled or fermented foods. Foods with high sugar content. Foods high in fiber. The items listed above may not be a complete list of foods and beverages to avoid. Contact a dietitian for more information. Summary  A bland diet consists of foods that are often soft and do not have a lot of fat, fiber, or extra seasonings.  Foods without fat, fiber, or seasoning are easier for the body to digest.  Check with your health care provider to see how long you should follow this diet plan. It is not meant to be followed for long periods. This information is not intended to replace advice given to you by your health care provider. Make sure you discuss any questions you have with your health care provider. Document Released: 09/23/2015 Document Revised: 06/30/2017 Document Reviewed: 06/30/2017 Elsevier Interactive Patient Education  2019 [employer].        Visit summary with medication list and pertinent instructions was printed for patient to review. All questions at time of visit were answered - patient instructed to contact office with any additional concerns or updates. ER/RTC precautions were reviewed with the patient.   Follow-up plan: Return if symptoms worsen or fail to improve.    Please note:  voice recognition software was used to produce this document, and typos may escape review. Please contact Dr. Sheppard Coil for any needed clarifications.

## 2018-10-04 NOTE — Patient Instructions (Addendum)
If this is something like a colitis or pancreatitis, recommend clear liquid diet and slowly progress to full liquid then bland as you're improving.   Will have some more information as I get results back. In the meantime, rest and stay hydrated!         Clear Liquid Diet, Adult A clear liquid diet is a diet that includes only liquids and semi-liquids that you can see through. You do not eat any food on this diet. Most people need to follow this diet for only a short period of time. You may need to follow a clear liquid diet if:  You develop a medical condition right before or after you have surgery.  You were not able to eat food for a long period of time.  You had a condition that gave you nausea, vomiting, or diarrhea.  You are going to have a procedure or exam to look at parts of your digestive system.  You are going to have bowel surgery. The usual goals of this diet are:  To rest the stomach and digestive system as much as possible.  To help you clear the digestive system before a procedure or exam.  To keep you hydrated.  To make sure you get some calories for energy.  To help you return to your normal eating routine. What are tips for following this plan?  A clear liquid is a liquid or semi-liquid, such as gelatin, that you can see through when you hold it up to a light.  A clear liquid diet does not provide all the nutrients that you need. It is important to choose a variety of the liquids or semi-liquids that are allowed on this diet. That way, you will get as many nutrients as possible.  If you are not sure whether you can have certain items, ask your health care provider. If you have difficulty swallowing thin liquids, you will need to thicken them to prevent breathing in (aspiration). What foods should I eat?   Water and flavored water.  Fruit juices that do not have pulp, such as cranberry juice and apple juice.  Tea and coffee without milk or  cream.  Clear bouillon or broth.  Broth-based soups that have been strained.  Flavored gelatin.  Honey.  Sugar water.  Ice or frozen ice pops that do not contain milk, yogurt, fruit pieces, or fruit pulp.  Clear sodas.  Clear sports drinks. The items listed above may not be a complete list of recommended foods and beverages. Contact a dietitian for more information. What food should I avoid?  Juices that have pulp.  Milk.  Cream or cream-based soups.  Yogurt.  Regular foods that are not clear liquids or semi-liquids. The items listed above may not be a complete list of foods and beverages to avoid. Contact a dietitian for more information. Questions to ask your health care provider:  How long do I need to follow this diet?  Are there any medicines that I should change while on this diet? Summary  A clear liquid diet is a diet that includes liquids and semi-liquids that you can see through.  The goal of this diet is to help you recover by resting your digestive system, keeping you hydrated, and providing nutrients.  Avoid liquids with milk, cream, or pulp while on this diet. This information is not intended to replace advice given to you by your health care provider. Make sure you discuss any questions you have with your health care provider.  Document Released: 06/01/2005 Document Revised: 11/22/2017 Document Reviewed: 11/22/2017 Elsevier Interactive Patient Education  2019 [employer]     Full Liquid Diet A full liquid diet refers to fluids and foods that are liquid or will become liquid at room temperature. This diet should only be used for a short period of time to help you recover from illness or surgery. Your health care provider or dietitian will help you determine when it is safe to eat regular foods. What are tips for following this plan?     Reading food labels  Check food labels of nutrition shakes for the amount of protein. Look for nutrition  shakes that have at least 8-10 grams of protein in each serving.  Look for drinks, such as milks and juices, that are "fortified" or "enriched." This means that vitamins and minerals have been added. Shopping  Buy premade nutritional shakes to keep on hand.  To vary your choices, buy different flavors of milks and shakes. Meal planning  Choose flavors and foods that you enjoy.  To make sure you get enough energy from food (calories): ? Eat 3 full liquid meals each day. Have a liquid snack between each meal. ? Drink 6-8 ounces (177-237 ml) of a nutrition supplement shake with meals or as snacks. ? Add protein powder, powdered milk, milk, or yogurt to shakes to increase the amount of protein.  Drink at least one serving a day of citrus fruit juice or fruit juice that has vitamin C added. General guidelines  Before starting the full-liquid diet, check with your health care provider to know what foods you should avoid. These may include full-fat or high-fiber liquids.  You may have any liquid or food that becomes a liquid at room temperature. The food is considered a liquid if it can be poured off a spoon at room temperature.  Do not drink alcohol unless approved by your health care provider.  This diet gives you most of the nutrients that you need for energy, but you may not get enough of certain vitamins, minerals, and fiber. Make sure to talk to your health care provider or dietitian about: ? How many calories you need to eat get day. ? How much fluid you should have each day. ? Taking a multivitamin or a nutritional supplement. What foods are allowed? The items listed may not be a complete list. Talk with your dietitian about what dietary choices are best for you. Grains Thin hot cereal, such as cream of wheat. Soft-cooked pasta or rice pured in soup. Vegetables Pulp-free tomato or vegetable juice. Vegetables pured in soup. Fruits Fruit juice without pulp. Strained fruit pures  (seeds and skins removed). Meats and other protein foods Beef, chicken, and fish broths. Powdered protein supplements. Dairy Milk and milk-based beverages, including milk shakes and instant breakfast mixes. Smooth yogurt. Pured cottage cheese. Beverages Water. Coffee and tea (caffeinated or decaffeinated). Cocoa. Liquid nutritional supplements. Soft drinks. Nondairy milks, such as almond, coconut, rice, or soy milk. Fats and oils Melted margarine and butter. Cream. Canola, almond, avocado, corn, grapeseed, sunflower, and sesame oils. Gravy. Sweets and desserts Custard. Pudding. Flavored gelatin. Smooth ice cream (without nuts or candy pieces). Sherbet. Popsicles. [place] ice. Pudding pops. Seasoning and other foods Salt and pepper. Spices. Cocoa powder. Vinegar. Ketchup. Yellow mustard. Smooth sauces, such as [place], cheese sauce, or white sauce. Soy sauce. Cream soups. Strained soups. Syrup. Honey. Jelly (without fruit pieces). What foods are not allowed? The items listed may not be a complete  list. Talk with your dietitian about what dietary choices are best for you. Grains Whole grains. Pasta. Rice. Cold cereal. Bread. Crackers. Vegetables All whole fresh, frozen, or canned vegetables. Fruits All whole fresh, frozen, or canned fruits. Meats and other protein foods All cuts of meat, poultry, and fish. Eggs. Tofu and soy protein. Nuts and nut butters. Lunch meat. Sausage. Dairy Hard cheese. Yogurt with fruit chunks. Fats and oils Coconut oil. Palm oil. Lard. Cold butter. Sweets and desserts Ice cream or other frozen desserts that have any solids in them or on top, such as nuts, chocolate chips, and pieces of cookies. Cakes. Cookies. Candy. Seasoning and other foods Stone-ground mustards. Soups with chunks or pieces. Summary  A full liquid diet refers to fluids and foods that are liquid or will become liquid at room temperature.  This diet should only be used for a short  period of time to help you recover from illness or surgery. Ask your health care provider or dietitian when it is safe for you to eat regular foods.  To make sure you get enough calories and nutrients, eat 3 meals each day with snacks between. Drink premade nutrition supplement shakes or add protein powder to homemade shakes. Take a vitamin and mineral supplement as told by your health care provider. This information is not intended to replace advice given to you by your health care provider. Make sure you discuss any questions you have with your health care provider. Document Released: 06/01/2005 Document Revised: 07/15/2016 Document Reviewed: 07/15/2016 Elsevier Interactive Patient Education  2019 [place] Diet A bland diet consists of foods that are often soft and do not have a lot of fat, fiber, or extra seasonings. Foods without fat, fiber, or seasoning are easier for the body to digest. They are also less likely to irritate your mouth, throat, stomach, and other parts of your digestive system. A bland diet is sometimes called a BRAT diet. What is my plan? Your health care provider or food and nutrition specialist (dietitian) may recommend specific changes to your diet to prevent symptoms or to treat your symptoms. These changes may include:  Eating small meals often.  Cooking food until it is soft enough to chew easily.  Chewing your food well.  Drinking fluids slowly.  Not eating foods that are very spicy, sour, or fatty.  Not eating citrus fruits, such as oranges and grapefruit. What do I need to know about this diet?  Eat a variety of foods from the bland diet food list.  Do not follow a bland diet longer than needed.  Ask your health care provider whether you should take vitamins or supplements. What foods can I eat? Grains  Hot cereals, such as cream of wheat. Rice. Bread, crackers, or tortillas made from refined white flour. Vegetables Canned or cooked  vegetables. Mashed or boiled potatoes. Fruits  Bananas. Applesauce. Other types of cooked or canned fruit with the skin and seeds removed, such as canned peaches or pears. Meats and other proteins  Scrambled eggs. Creamy peanut butter or other nut butters. Lean, well-cooked meats, such as chicken or fish. Tofu. Soups or broths. Dairy Low-fat dairy products, such as milk, cottage cheese, or yogurt. Beverages  Water. Herbal tea. Apple juice. Fats and oils Mild salad dressings. Canola or olive oil. Sweets and desserts Pudding. Custard. Fruit gelatin. Ice cream. The items listed above may not be a complete list of recommended foods and beverages. Contact a dietitian for more options.  What foods are not recommended? Grains Whole grain breads and cereals. Vegetables Raw vegetables. Fruits Raw fruits, especially citrus, berries, or dried fruits. Dairy Whole fat dairy foods. Beverages Caffeinated drinks. Alcohol. Seasonings and condiments Strongly flavored seasonings or condiments. Hot sauce. Salsa. Other foods Spicy foods. Fried foods. Sour foods, such as pickled or fermented foods. Foods with high sugar content. Foods high in fiber. The items listed above may not be a complete list of foods and beverages to avoid. Contact a dietitian for more information. Summary  A bland diet consists of foods that are often soft and do not have a lot of fat, fiber, or extra seasonings.  Foods without fat, fiber, or seasoning are easier for the body to digest.  Check with your health care provider to see how long you should follow this diet plan. It is not meant to be followed for long periods. This information is not intended to replace advice given to you by your health care provider. Make sure you discuss any questions you have with your health care provider. Document Released: 09/23/2015 Document Revised: 06/30/2017 Document Reviewed: 06/30/2017 Elsevier Interactive Patient Education  2019  [employer].

## 2018-10-04 NOTE — Telephone Encounter (Signed)
Patient scheduled.

## 2018-10-06 ENCOUNTER — Encounter: Payer: Self-pay | Admitting: Osteopathic Medicine

## 2018-10-07 ENCOUNTER — Encounter: Payer: Self-pay | Admitting: Osteopathic Medicine

## 2018-10-10 MED ORDER — ONDANSETRON 8 MG PO TBDP: 8.0000 mg | ORAL_TABLET | Freq: Three times a day (TID) | ORAL | 3 refills | Status: DC | PRN

## 2018-10-10 MED FILL — ONDANSETRON ODT 8 MG TABLET: 8 | 6 days supply | Qty: 20 | Fill #0

## 2018-10-28 ENCOUNTER — Other Ambulatory Visit: Payer: Self-pay | Admitting: Endocrinology

## 2018-11-01 MED FILL — ATORVASTATIN 10 MG TABLET: 10 | 30 days supply | Qty: 30 | Fill #3

## 2018-11-08 ENCOUNTER — Telehealth: Payer: Self-pay | Admitting: Hematology

## 2018-11-08 ENCOUNTER — Ambulatory Visit (INDEPENDENT_AMBULATORY_CARE_PROVIDER_SITE_OTHER): Payer: PRIVATE HEALTH INSURANCE | Admitting: Osteopathic Medicine

## 2018-11-08 ENCOUNTER — Telehealth: Payer: Self-pay

## 2018-11-08 ENCOUNTER — Encounter: Payer: Self-pay | Admitting: Osteopathic Medicine

## 2018-11-08 VITALS — BP 149/81 | HR 81 | Temp 99.1°F | Wt 265.0 lb

## 2018-11-08 DIAGNOSIS — Z20822 Contact with and (suspected) exposure to covid-19: Secondary | ICD-10-CM

## 2018-11-08 DIAGNOSIS — R6889 Other general symptoms and signs: Secondary | ICD-10-CM | POA: Diagnosis not present

## 2018-11-08 NOTE — Telephone Encounter (Signed)
Pt has been informed of provider's note. As per pt, wife has a virtual visit. He will be joining in on the visit as well, since there was no other openings today. No other inquiries during call.

## 2018-11-08 NOTE — Telephone Encounter (Signed)
Left message on cell phone to call back and schedule COVID screening. / Dr. Emeterio Reeve is the referring provider /

## 2018-11-08 NOTE — Telephone Encounter (Signed)
Vanicia: At the drive-through testing centers, an order is still required.  I sent a notification so that someone will contact them to set up a testing.  Please tell them do not go to one of the testing centers without having heard back from someone from [facility] first!  To whom it may concern from the community testing center: I believe this patient qualifies for testing given underlying conditions of obesity and obstructive sleep apnea.  He also works in [place] at [employer].

## 2018-11-08 NOTE — Telephone Encounter (Signed)
DR. Sheppard Coil requests COVID 19 testing.

## 2018-11-08 NOTE — Telephone Encounter (Signed)
Patient can not get to [place] site today.  I offered tomorrow, however, patient states he is doing a virtual visit with his wife in about 30 minutes and he would call back.  Patient states he has already been out of work today and is not sure about tomorrow.

## 2018-11-08 NOTE — Telephone Encounter (Signed)
FYI:  We do not do Rapid testing.  It takes 48-72 hours for results.  Sometimes longer.

## 2018-11-08 NOTE — Telephone Encounter (Signed)
Pt left vm msg stating that his wife's job had 3 confirmed cases of COVID 19. Pt and his wife are now symptomatic - feeling weak, tired & light headiness. Requesting feedback from provider. Pt and wife will probably stop by a rapid testing center today. Pls advise, thanks.

## 2018-11-08 NOTE — Telephone Encounter (Signed)
Message left for patient to call back and schedule.

## 2018-11-08 NOTE — Progress Notes (Signed)
Virtual Visit via Video (App used: Doximity) Note  I connected with      Joshua Brady on 11/09/18 at 2:49 PM  by a telemedicine application and verified that I am speaking with the correct person using two identifiers.  Patient is at home I am in office    I discussed the limitations of evaluation and management by telemedicine and the availability of in person appointments. The patient expressed understanding and agreed to proceed.  History of Present Illness: Joshua Brady is a 43 y.o. 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 His has been up and down. Significant fatigue, night sweats. Wife tested positive for COVID IgG at [place] test at work.       Observations/Objective: BP (!) 149/81 (Patient Position: Sitting, Cuff Size: Normal)   Pulse 81   Temp 99.1 F (37.3 C) (Oral)   Wt 265 lb (120.2 kg)   BMI 35.94 kg/m  BP Readings from Last 3 Encounters:  11/08/18 (!) 149/81  10/04/18 128/82  04/29/18 124/72   Exam: Normal Speech.  NAD   Lab and Radiology Results No results found for this or any previous visit (from the past 72 hour(s)). No results found.     Assessment and Plan: 43 y.o. male with The encounter diagnosis was Suspected Covid-19 Virus Infection.  Drive-up testing is arranged.   PDMP not reviewed this encounter. No orders of the defined types were placed in this encounter.  No orders of the defined types were placed in this encounter.  Patient Instructions     Person Under Monitoring Name: Joshua Brady  Location: [address] [place] [address]   Infection Prevention Recommendations for Individuals Confirmed to have, or Being Evaluated for, 2019 Novel Coronavirus (COVID-19) Infection Who Receive Care at Home  Individuals who are confirmed to have, or are being evaluated for, COVID-19 should follow the prevention steps below until a healthcare provider or  local or state health department says they can return to normal activities.  Stay home except to get medical care You should restrict activities outside your home, except for getting medical care. Do not go to work, school, or public areas, and do not use public transportation or taxis.  Call ahead before visiting your doctor Before your medical appointment, call the healthcare provider and tell them that you have, or are being evaluated for, COVID-19 infection. This will help the healthcare provider's office take steps to keep other people from getting infected. Ask your healthcare provider to call the local or state health department.  Monitor your symptoms Seek prompt medical attention if your illness is worsening (e.g., difficulty breathing). Before going to your medical appointment, call the healthcare provider and tell them that you have, or are being evaluated for, COVID-19 infection. Ask your healthcare provider to call the local or state health department.  Wear a facemask You should wear a facemask that covers your nose and mouth when you are in the same room with other people and when you visit a healthcare provider. People who live with or visit you should also wear a facemask while they are in the same room with you.  Separate yourself from other people in your home As much as possible, you should stay in a different room from other people in your home. Also, you should use a separate bathroom, if available.  Avoid sharing household items You should not share dishes,  drinking glasses, cups, eating utensils, towels, bedding, or other items with other people in your home. After using these items, you should wash them thoroughly with soap and water.  Cover your coughs and sneezes Cover your mouth and nose with a tissue when you cough or sneeze, or you can cough or sneeze into your sleeve. Throw used tissues in a lined trash can, and immediately wash your hands with soap and  water for at least 20 seconds or use an alcohol-based hand rub.  Wash your [facility] your hands often and thoroughly with soap and water for at least 20 seconds. You can use an alcohol-based hand sanitizer if soap and water are not available and if your hands are not visibly dirty. Avoid touching your eyes, nose, and mouth with unwashed hands.   Prevention Steps for Caregivers and Household Members of Individuals Confirmed to have, or Being Evaluated for, COVID-19 Infection Being Cared for in the Home  If you live with, or provide care at home for, a person confirmed to have, or being evaluated for, COVID-19 infection please follow these guidelines to prevent infection:  Follow healthcare provider's instructions Make sure that you understand and can help the patient follow any healthcare provider instructions for all care.  Provide for the patient's basic needs You should help the patient with basic needs in the home and provide support for getting groceries, prescriptions, and other personal needs.  Monitor the patient's symptoms If they are getting sicker, call his or her medical provider and tell them that the patient has, or is being evaluated for, COVID-19 infection. This will help the healthcare provider's office take steps to keep other people from getting infected. Ask the healthcare provider to call the local or state health department.  Limit the number of people who have contact with the patient  If possible, have only one caregiver for the patient.  Other household members should stay in another home or place of residence. If this is not possible, they should stay  in another room, or be separated from the patient as much as possible. Use a separate bathroom, if available.  Restrict visitors who do not have an essential need to be in the home.  Keep older adults, very young children, and other sick people away from the patient Keep older adults, very young children,  and those who have compromised immune systems or chronic health conditions away from the patient. This includes people with chronic heart, lung, or kidney conditions, diabetes, and cancer.  Ensure good ventilation Make sure that shared spaces in the home have good air flow, such as from an air conditioner or an opened window, weather permitting.  Wash your hands often  Wash your hands often and thoroughly with soap and water for at least 20 seconds. You can use an alcohol based hand sanitizer if soap and water are not available and if your hands are not visibly dirty.  Avoid touching your eyes, nose, and mouth with unwashed hands.  Use disposable paper towels to dry your hands. If not available, use dedicated cloth towels and replace them when they become wet.  Wear a facemask and gloves  Wear a disposable facemask at all times in the room and gloves when you touch or have contact with the patient's blood, body fluids, and/or secretions or excretions, such as sweat, saliva, sputum, nasal mucus, vomit, urine, or feces.  Ensure the mask fits over your nose and mouth tightly, and do not touch it during use.  Throw out disposable facemasks and gloves after using them. Do not reuse.  Wash your hands immediately after removing your facemask and gloves.  If your personal clothing becomes contaminated, carefully remove clothing and launder. Wash your hands after handling contaminated clothing.  Place all used disposable facemasks, gloves, and other waste in a lined container before disposing them with other household waste.  Remove gloves and wash your hands immediately after handling these items.  Do not share dishes, glasses, or other household items with the patient  Avoid sharing household items. You should not share dishes, drinking glasses, cups, eating utensils, towels, bedding, or other items with a patient who is confirmed to have, or being evaluated for, COVID-19 infection.  After  the person uses these items, you should wash them thoroughly with soap and water.  Wash laundry thoroughly  Immediately remove and wash clothes or bedding that have blood, body fluids, and/or secretions or excretions, such as sweat, saliva, sputum, nasal mucus, vomit, urine, or feces, on them.  Wear gloves when handling laundry from the patient.  Read and follow directions on labels of laundry or clothing items and detergent. In general, wash and dry with the warmest temperatures recommended on the label.  Clean all areas the individual has used often  Clean all touchable surfaces, such as counters, tabletops, doorknobs, bathroom fixtures, toilets, phones, keyboards, tablets, and bedside tables, every day. Also, clean any surfaces that may have blood, body fluids, and/or secretions or excretions on them.  Wear gloves when cleaning surfaces the patient has come in contact with.  Use a diluted bleach solution (e.g., dilute bleach with 1 part bleach and 10 parts water) or a household disinfectant with a label that says EPA-registered for coronaviruses. To make a bleach solution at home, add 1 tablespoon of bleach to 1 quart (4 cups) of water. For a larger supply, add  cup of bleach to 1 gallon (16 cups) of water.  Read labels of cleaning products and follow recommendations provided on product labels. Labels contain instructions for safe and effective use of the cleaning product including precautions you should take when applying the product, such as wearing gloves or eye protection and making sure you have good ventilation during use of the product.  Remove gloves and wash hands immediately after cleaning.  Monitor yourself for signs and symptoms of illness Caregivers and household members are considered close contacts, should monitor their health, and will be asked to limit movement outside of the home to the extent possible. Follow the monitoring steps for close contacts listed on the  symptom monitoring form.   ? If you have additional questions, contact your local health department or call the epidemiologist on call at [phone] (available 24/7). ? This guidance is subject to change. For the most up-to-date guidance from [facility], please refer to their website: [email or web address]    Instructions sent via [place]. If MyChart not available, pt was given option for info via personal e-mail w/ no guarantee of protected health info over unsecured e-mail communication, and MyChart sign-up instructions were included.   Follow Up Instructions: Return if symptoms worsen or fail to improve.    I discussed the assessment and treatment plan with the patient. The patient was provided an opportunity to ask questions and all were answered. The patient agreed with the plan and demonstrated an understanding of the instructions.   The patient was advised to call back or seek an in-person evaluation if any new concerns, if symptoms worsen or if the  condition fails to improve as anticipated.  15 minutes of non-face-to-face time was provided during this encounter.                      Historical information moved to improve visibility of documentation.  Past Medical History:  Diagnosis Date  . Borderline high cholesterol 01/25/2017   10-yr ASCVD 4.1%  . Dyslipidemia (high LDL; low HDL) 01/25/2017   10-yr ASCVD risk 4.1% (01/2017)  . Head injury 2016  . Heart murmur    Past Surgical History:  Procedure Laterality Date  . Broken Arm    . LIVER BIOPSY     Social History   Tobacco Use  . Smoking status: Never Smoker  . Smokeless tobacco: Never Used  Substance Use Topics  . Alcohol use: Yes   family history includes Alcohol abuse in his father.  Medications: Current Outpatient Medications  Medication Sig Dispense Refill  . clomiPHENE (CLOMID) 50 MG tablet 1/4 tab daily 8 tablet 5  . ondansetron  (ZOFRAN-ODT) 8 MG disintegrating tablet Take 1 tablet (8 mg total) by mouth every 8 (eight) hours as needed for nausea. 20 tablet 3  . atorvastatin (LIPITOR) 10 MG tablet TAKE 1 TABLET (10 MG TOTAL) BY MOUTH DAILY. 30 tablet 3   Current Facility-Administered Medications  Medication Dose Route Frequency Provider Last Rate Last Dose  . albuterol (PROVENTIL) (2.5 MG/3ML) 0.083% nebulizer solution 2.5 mg  2.5 mg Nebulization Once Emeterio Reeve, DO       Allergies  Allergen Reactions  . Other     Ants - see notes from ER visit 02/2018  . Prednisone   . Topiramate     Anxiety, dizzy, depressed, suicidal thoughts, agitated    PDMP not reviewed this encounter. No orders of the defined types were placed in this encounter.  No orders of the defined types were placed in this encounter.

## 2018-11-09 ENCOUNTER — Other Ambulatory Visit: Payer: Self-pay

## 2018-11-09 ENCOUNTER — Encounter: Payer: Self-pay | Admitting: Osteopathic Medicine

## 2018-11-09 DIAGNOSIS — Z20822 Contact with and (suspected) exposure to covid-19: Secondary | ICD-10-CM

## 2018-11-09 DIAGNOSIS — R7989 Other specified abnormal findings of blood chemistry: Secondary | ICD-10-CM

## 2018-11-09 NOTE — Telephone Encounter (Signed)
I'm aware that they don't do the rapid test.

## 2018-11-09 NOTE — Patient Instructions (Signed)
Person Under Monitoring Name: Joshua Brady  Location: [address] [place] [address]   Infection Prevention Recommendations for Individuals Confirmed to have, or Being Evaluated for, 2019 Novel Coronavirus (COVID-19) Infection Who Receive Care at Home  Individuals who are confirmed to have, or are being evaluated for, COVID-19 should follow the prevention steps below until a healthcare provider or local or state health department says they can return to normal activities.  Stay home except to get medical care You should restrict activities outside your home, except for getting medical care. Do not go to work, school, or public areas, and do not use public transportation or taxis.  Call ahead before visiting your doctor Before your medical appointment, call the healthcare provider and tell them that you have, or are being evaluated for, COVID-19 infection. This will help the healthcare provider's office take steps to keep other people from getting infected. Ask your healthcare provider to call the local or state health department.  Monitor your symptoms Seek prompt medical attention if your illness is worsening (e.g., difficulty breathing). Before going to your medical appointment, call the healthcare provider and tell them that you have, or are being evaluated for, COVID-19 infection. Ask your healthcare provider to call the local or state health department.  Wear a facemask You should wear a facemask that covers your nose and mouth when you are in the same room with other people and when you visit a healthcare provider. People who live with or visit you should also wear a facemask while they are in the same room with you.  Separate yourself from other people in your home As much as possible, you should stay in a different room from other people in your home. Also, you should use a separate bathroom, if available.  Avoid sharing household items You should not share  dishes, drinking glasses, cups, eating utensils, towels, bedding, or other items with other people in your home. After using these items, you should wash them thoroughly with soap and water.  Cover your coughs and sneezes Cover your mouth and nose with a tissue when you cough or sneeze, or you can cough or sneeze into your sleeve. Throw used tissues in a lined trash can, and immediately wash your hands with soap and water for at least 20 seconds or use an alcohol-based hand rub.  Wash your [facility] your hands often and thoroughly with soap and water for at least 20 seconds. You can use an alcohol-based hand sanitizer if soap and water are not available and if your hands are not visibly dirty. Avoid touching your eyes, nose, and mouth with unwashed hands.   Prevention Steps for Caregivers and Household Members of Individuals Confirmed to have, or Being Evaluated for, COVID-19 Infection Being Cared for in the Home  If you live with, or provide care at home for, a person confirmed to have, or being evaluated for, COVID-19 infection please follow these guidelines to prevent infection:  Follow healthcare provider's instructions Make sure that you understand and can help the patient follow any healthcare provider instructions for all care.  Provide for the patient's basic needs You should help the patient with basic needs in the home and provide support for getting groceries, prescriptions, and other personal needs.  Monitor the patient's symptoms If they are getting sicker, call his or her medical provider and tell them that the patient has, or is being evaluated for, COVID-19 infection. This will help the healthcare provider's office  take steps to keep other people from getting infected. Ask the healthcare provider to call the local or state health department.  Limit the number of people who have contact with the patient  If possible, have only one caregiver for the patient.  Other  household members should stay in another home or place of residence. If this is not possible, they should stay  in another room, or be separated from the patient as much as possible. Use a separate bathroom, if available.  Restrict visitors who do not have an essential need to be in the home.  Keep older adults, very young children, and other sick people away from the patient Keep older adults, very young children, and those who have compromised immune systems or chronic health conditions away from the patient. This includes people with chronic heart, lung, or kidney conditions, diabetes, and cancer.  Ensure good ventilation Make sure that shared spaces in the home have good air flow, such as from an air conditioner or an opened window, weather permitting.  Wash your hands often  Wash your hands often and thoroughly with soap and water for at least 20 seconds. You can use an alcohol based hand sanitizer if soap and water are not available and if your hands are not visibly dirty.  Avoid touching your eyes, nose, and mouth with unwashed hands.  Use disposable paper towels to dry your hands. If not available, use dedicated cloth towels and replace them when they become wet.  Wear a facemask and gloves  Wear a disposable facemask at all times in the room and gloves when you touch or have contact with the patient's blood, body fluids, and/or secretions or excretions, such as sweat, saliva, sputum, nasal mucus, vomit, urine, or feces.  Ensure the mask fits over your nose and mouth tightly, and do not touch it during use.  Throw out disposable facemasks and gloves after using them. Do not reuse.  Wash your hands immediately after removing your facemask and gloves.  If your personal clothing becomes contaminated, carefully remove clothing and launder. Wash your hands after handling contaminated clothing.  Place all used disposable facemasks, gloves, and other waste in a lined container before  disposing them with other household waste.  Remove gloves and wash your hands immediately after handling these items.  Do not share dishes, glasses, or other household items with the patient  Avoid sharing household items. You should not share dishes, drinking glasses, cups, eating utensils, towels, bedding, or other items with a patient who is confirmed to have, or being evaluated for, COVID-19 infection.  After the person uses these items, you should wash them thoroughly with soap and water.  Wash laundry thoroughly  Immediately remove and wash clothes or bedding that have blood, body fluids, and/or secretions or excretions, such as sweat, saliva, sputum, nasal mucus, vomit, urine, or feces, on them.  Wear gloves when handling laundry from the patient.  Read and follow directions on labels of laundry or clothing items and detergent. In general, wash and dry with the warmest temperatures recommended on the label.  Clean all areas the individual has used often  Clean all touchable surfaces, such as counters, tabletops, doorknobs, bathroom fixtures, toilets, phones, keyboards, tablets, and bedside tables, every day. Also, clean any surfaces that may have blood, body fluids, and/or secretions or excretions on them.  Wear gloves when cleaning surfaces the patient has come in contact with.  Use a diluted bleach solution (e.g., dilute bleach with 1 part  bleach and 10 parts water) or a household disinfectant with a label that says EPA-registered for coronaviruses. To make a bleach solution at home, add 1 tablespoon of bleach to 1 quart (4 cups) of water. For a larger supply, add  cup of bleach to 1 gallon (16 cups) of water.  Read labels of cleaning products and follow recommendations provided on product labels. Labels contain instructions for safe and effective use of the cleaning product including precautions you should take when applying the product, such as wearing gloves or eye protection  and making sure you have good ventilation during use of the product.  Remove gloves and wash hands immediately after cleaning.  Monitor yourself for signs and symptoms of illness Caregivers and household members are considered close contacts, should monitor their health, and will be asked to limit movement outside of the home to the extent possible. Follow the monitoring steps for close contacts listed on the symptom monitoring form.   ? If you have additional questions, contact your local health department or call the epidemiologist on call at [phone] (available 24/7). ? This guidance is subject to change. For the most up-to-date guidance from [facility], please refer to their website: [email or web address]

## 2018-11-09 NOTE — Telephone Encounter (Signed)
Forwarding to provider for review.

## 2018-11-11 ENCOUNTER — Telehealth: Payer: Self-pay | Admitting: Osteopathic Medicine

## 2018-11-11 LAB — NOVEL CORONAVIRUS, NAA: SARS-CoV-2, NAA: NOT DETECTED

## 2018-11-11 NOTE — Telephone Encounter (Signed)
Attempted to call pt regarding covid-19 results. No answer.

## 2018-11-11 NOTE — Telephone Encounter (Signed)
Attempted to call patient to give results of covid-19, no answer. Will try again. No message left.

## 2018-11-25 ENCOUNTER — Telehealth: Payer: Self-pay

## 2018-11-25 NOTE — Telephone Encounter (Signed)
LOV 04/29/18. Per Dr. Loanne Drilling, f/u in 1 mo. Called pt to schedule appts. LVM requesting returned call.

## 2018-12-22 ENCOUNTER — Other Ambulatory Visit: Payer: Self-pay

## 2018-12-23 ENCOUNTER — Ambulatory Visit: Payer: PRIVATE HEALTH INSURANCE | Admitting: Endocrinology

## 2018-12-30 ENCOUNTER — Encounter: Payer: Self-pay | Admitting: Endocrinology

## 2018-12-30 ENCOUNTER — Other Ambulatory Visit: Payer: Self-pay

## 2018-12-30 ENCOUNTER — Ambulatory Visit (INDEPENDENT_AMBULATORY_CARE_PROVIDER_SITE_OTHER): Payer: PRIVATE HEALTH INSURANCE | Admitting: Endocrinology

## 2018-12-30 VITALS — BP 154/70 | HR 95 | Ht 72.0 in | Wt 274.0 lb

## 2018-12-30 DIAGNOSIS — R7989 Other specified abnormal findings of blood chemistry: Secondary | ICD-10-CM | POA: Diagnosis not present

## 2018-12-30 NOTE — Patient Instructions (Addendum)
Your blood pressure is high today.  Please see your primary care provider soon, to have it rechecked Blood tests are requested for you today.  We'll let you know about the results.  If it is low again, please resume the medication.  Please come back for a follow-up appointment in 6 months.

## 2018-12-30 NOTE — Progress Notes (Signed)
Subjective:    Patient ID: Joshua Brady, male    DOB: 1975/07/23, 43 y.o.   MRN: [id number]  HPI Pt return for f/u of idiopathic central hypogonadism (dx'ed 2019; he has 2 biological children; wife has had TL; he had a concussion in 2016; he has mild OSA).  Pt says on the clomid, fatigue was initially much better.  However, over time fatigue worsened again.  He stopped clomid a few months ago.   Past Medical History:  Diagnosis Date  . Borderline high cholesterol 01/25/2017   10-yr ASCVD 4.1%  . Dyslipidemia (high LDL; low HDL) 01/25/2017   10-yr ASCVD risk 4.1% (01/2017)  . Head injury 2016  . Heart murmur     Past Surgical History:  Procedure Laterality Date  . Broken Arm    . LIVER BIOPSY      Social History   Socioeconomic History  . Marital status: Married    Spouse name: Not on file  . Number of children: Not on file  . Years of education: Not on file  . Highest education level: Not on file  Occupational History  . Not on file  Social Needs  . Financial resource strain: Not on file  . Food insecurity    Worry: Not on file    Inability: Not on file  . Transportation needs    Medical: Not on file    Non-medical: Not on file  Tobacco Use  . Smoking status: Never Smoker  . Smokeless tobacco: Never Used  Substance and Sexual Activity  . Alcohol use: Yes  . Drug use: No  . Sexual activity: Yes    Birth control/protection: None  Lifestyle  . Physical activity    Days per week: Not on file    Minutes per session: Not on file  . Stress: Not on file  Relationships  . Social Herbalist on phone: Not on file    Gets together: Not on file    Attends religious service: Not on file    Active member of club or organization: Not on file    Attends meetings of clubs or organizations: Not on file    Relationship status: Not on file  . Intimate partner violence    Fear of current or ex partner: Not on file    Emotionally abused: Not on file    Physically  abused: Not on file    Forced sexual activity: Not on file  Other Topics Concern  . Not on file  Social History Narrative  . Not on file    Current Outpatient Medications on File Prior to Visit  Medication Sig Dispense Refill  . ondansetron (ZOFRAN-ODT) 8 MG disintegrating tablet Take 1 tablet (8 mg total) by mouth every 8 (eight) hours as needed for nausea. 20 tablet 3  . atorvastatin (LIPITOR) 10 MG tablet TAKE 1 TABLET (10 MG TOTAL) BY MOUTH DAILY. 30 tablet 3  . clomiPHENE (CLOMID) 50 MG tablet 1/4 tab daily (Patient not taking: Reported on 12/30/2018) 8 tablet 5   Current Facility-Administered Medications on File Prior to Visit  Medication Dose Route Frequency Provider Last Rate Last Dose  . albuterol (PROVENTIL) (2.5 MG/3ML) 0.083% nebulizer solution 2.5 mg  2.5 mg Nebulization Once Emeterio Reeve, DO        Allergies  Allergen Reactions  . Other     Ants - see notes from ER visit 02/2018  . Prednisone   . Topiramate     Anxiety, dizzy,  depressed, suicidal thoughts, agitated    Family History  Problem Relation Age of Onset  . Alcohol abuse Father   . Other Neg Hx        low testosterone    BP (!) 154/70 (BP Location: Left Arm, Patient Position: Sitting, Cuff Size: Large)   Pulse 95   Ht 6' (1.829 m)   Wt 274 lb (124.3 kg)   SpO2 98%   BMI 37.16 kg/m   Review of Systems He has intermitt insomnia.      Objective:   Physical Exam VITAL SIGNS:  See vs page GENERAL: no distress Ext: no leg swelling.   Lab Results  Component Value Date   TESTOSTERONE 579 12/30/2018       Assessment & Plan:  HTN: is noted today Hypogonadism: well-controlled, despite being off clomid.  I told pt he can stay off for now.  Patient Instructions  Your blood pressure is high today.  Please see your primary care provider soon, to have it rechecked Blood tests are requested for you today.  We'll let you know about the results.  If it is low again, please resume the  medication.  Please come back for a follow-up appointment in 6 months.

## 2019-01-02 LAB — TESTOSTERONE,FREE AND TOTAL
Testosterone, Free: 5.1 pg/mL — ABNORMAL LOW (ref 6.8–21.5)
Testosterone: 579 ng/dL (ref 264–916)

## 2019-02-10 ENCOUNTER — Other Ambulatory Visit: Payer: Self-pay | Admitting: Cardiology

## 2019-02-10 MED FILL — ATORVASTATIN 10 MG TABLET: 10 | 30 days supply | Qty: 30 | Fill #0

## 2019-07-07 ENCOUNTER — Ambulatory Visit: Payer: PRIVATE HEALTH INSURANCE | Admitting: Endocrinology

## 2019-09-11 ENCOUNTER — Telehealth (INDEPENDENT_AMBULATORY_CARE_PROVIDER_SITE_OTHER): Payer: PRIVATE HEALTH INSURANCE | Admitting: Family Medicine

## 2019-09-11 ENCOUNTER — Encounter: Payer: Self-pay | Admitting: Family Medicine

## 2019-09-11 DIAGNOSIS — B349 Viral infection, unspecified: Secondary | ICD-10-CM

## 2019-09-11 DIAGNOSIS — R5383 Other fatigue: Secondary | ICD-10-CM

## 2019-09-11 DIAGNOSIS — R6889 Other general symptoms and signs: Secondary | ICD-10-CM

## 2019-09-11 DIAGNOSIS — R52 Pain, unspecified: Secondary | ICD-10-CM | POA: Diagnosis not present

## 2019-09-11 HISTORY — DX: Viral infection, unspecified: B34.9

## 2019-09-11 NOTE — Assessment & Plan Note (Signed)
Symptoms consistent with viral etiology.  He declines COVID testing at this time but will let us know if having any new or worsening symptoms.  Recommend continued supportive care with increased fluids and rest. He may continue OTC cold medications as needed.

## 2019-09-11 NOTE — Progress Notes (Signed)
Patient reports sinus pressure, mild cough, sore throat, mild body aches. Lower body pain and tiredness.   Started Saturday night.  No fever or shortness of breath,  Some phlegm this morning. Xyzal, Asprin and cold medication to help with sleep. It knocks the edge off.   Patient may need a work note since he has been out a couple of days.

## 2019-09-11 NOTE — Progress Notes (Signed)
Joshua Brady - 44 y.o. male MRN [id number]  Date of birth: [birth year]   This visit type was conducted due to national recommendations for restrictions regarding the COVID-19 Pandemic (e.g. social distancing).  This format is felt to be most appropriate for this patient at this time.  All issues noted in this document were discussed and addressed.  No physical exam was performed (except for noted visual exam findings with Video Visits).  I discussed the limitations of evaluation and management by telemedicine and the availability of in person appointments. The patient expressed understanding and agreed to proceed.  I connected with@ on 09/11/19 at  3:40 PM EDT by a video enabled telemedicine application and verified that I am speaking with the correct person using two identifiers.  Present at visit: Luetta Nutting, DO Joshua Brady   Patient Location: Home [address] [place] [address]   Provider location:   [facility]  Chief Complaint  Patient presents with  . Sinus Problem  . Cough  . Nasal Congestion    HPI  Joshua Brady is a 44 y.o. male who presents via audio/video conferencing for a telehealth visit today.  He has complaint today of mild congestion, fatigue and body aches.  Reports that symptoms started about 2 days ago.  He denies fever, chills, nausea, vomiting, diarrhea, changes to taste or smell, shortness of breath.  He has not had any known sick contacts.  He has had some improvement with OTC medications.    ROS:  A comprehensive ROS was completed and negative except as noted per HPI  Past Medical History:  Diagnosis Date  . Borderline high cholesterol 01/25/2017   10-yr ASCVD 4.1%  . Dyslipidemia (high LDL; low HDL) 01/25/2017   10-yr ASCVD risk 4.1% (01/2017)  . Head injury 2016  . Heart murmur     Past Surgical History:  Procedure Laterality Date  . Broken Arm    . LIVER BIOPSY      Family History  Problem Relation Age of Onset  . Alcohol abuse Father   .  Other Neg Hx        low testosterone    Social History   Socioeconomic History  . Marital status: Married    Spouse name: Not on file  . Number of children: Not on file  . Years of education: Not on file  . Highest education level: Not on file  Occupational History  . Not on file  Tobacco Use  . Smoking status: Never Smoker  . Smokeless tobacco: Never Used  Substance and Sexual Activity  . Alcohol use: Yes  . Drug use: No  . Sexual activity: Yes    Birth control/protection: None  Other Topics Concern  . Not on file  Social History Narrative  . Not on file   Social Determinants of Health   Financial Resource Strain:   . Difficulty of Paying Living Expenses:   Food Insecurity:   . Worried About Charity fundraiser in the Last Year:   . Arboriculturist in the Last Year:   Transportation Needs:   . Film/video editor (Medical):   Marland Kitchen Lack of Transportation (Non-Medical):   Physical Activity:   . Days of Exercise per Week:   . Minutes of Exercise per Session:   Stress:   . Feeling of Stress :   Social Connections:   . Frequency of Communication with Friends and Family:   . Frequency of Social Gatherings with Friends and Family:   .  Attends Religious Services:   . Active Member of Clubs or Organizations:   . Attends Archivist Meetings:   Marland Kitchen Marital Status:   Intimate Partner Violence:   . Fear of Current or Ex-Partner:   . Emotionally Abused:   Marland Kitchen Physically Abused:   . Sexually Abused:      Current Outpatient Medications:  .  atorvastatin (LIPITOR) 10 MG tablet, TAKE 1 TABLET BY MOUTH ONCE DAILY (Patient not taking: Reported on 09/11/2019), Disp: 30 tablet, Rfl: 0  EXAM:  VITALS per patient if applicable: Temp 98 F (123XX123 C) (Oral)   Ht 6' (1.829 m)   Wt 265 lb (120.2 kg)   BMI 35.94 kg/m   GENERAL: alert, oriented, appears well and in no acute distress  HEENT: atraumatic, conjunttiva clear, no obvious abnormalities on inspection of external  nose and ears  NECK: normal movements of the head and neck  LUNGS: on inspection no signs of respiratory distress, breathing rate appears normal, no obvious gross SOB, gasping or wheezing  CV: no obvious cyanosis  MS: moves all visible extremities without noticeable abnormality  PSYCH/NEURO: pleasant and cooperative, no obvious depression or anxiety, speech and thought processing grossly intact  ASSESSMENT AND PLAN:  Discussed the following assessment and plan:  Viral illness Symptoms consistent with viral etiology.  He declines COVID testing at this time but will let us know if having any new or worsening symptoms.  Recommend continued supportive care with increased fluids and rest. He may continue OTC cold medications as needed.      I discussed the assessment and treatment plan with the patient. The patient was provided an opportunity to ask questions and all were answered. The patient agreed with the plan and demonstrated an understanding of the instructions.   The patient was advised to call back or seek an in-person evaluation if the symptoms worsen or if the condition fails to improve as anticipated.    Luetta Nutting, DO

## 2019-09-16 ENCOUNTER — Encounter: Payer: Self-pay | Admitting: Family Medicine

## 2019-09-20 NOTE — Telephone Encounter (Signed)
Email has been sent by Elmo Putt

## 2020-01-25 ENCOUNTER — Emergency Department (HOSPITAL_BASED_OUTPATIENT_CLINIC_OR_DEPARTMENT_OTHER)
Admission: EM | Admit: 2020-01-25 | Discharge: 2020-01-25 | Disposition: A | Discharge status: Home / Self Care | Payer: No Typology Code available for payment source | Attending: Emergency Medicine | Admitting: Emergency Medicine

## 2020-01-25 ENCOUNTER — Emergency Department (HOSPITAL_COMMUNITY): Payer: No Typology Code available for payment source

## 2020-01-25 ENCOUNTER — Encounter (HOSPITAL_BASED_OUTPATIENT_CLINIC_OR_DEPARTMENT_OTHER): Payer: Self-pay

## 2020-01-25 ENCOUNTER — Other Ambulatory Visit: Payer: Self-pay

## 2020-01-25 ENCOUNTER — Emergency Department (HOSPITAL_BASED_OUTPATIENT_CLINIC_OR_DEPARTMENT_OTHER): Payer: No Typology Code available for payment source

## 2020-01-25 DIAGNOSIS — M545 Low back pain: Secondary | ICD-10-CM | POA: Diagnosis present

## 2020-01-25 DIAGNOSIS — R29898 Other symptoms and signs involving the musculoskeletal system: Secondary | ICD-10-CM

## 2020-01-25 DIAGNOSIS — M5416 Radiculopathy, lumbar region: Secondary | ICD-10-CM | POA: Diagnosis not present

## 2020-01-25 DIAGNOSIS — Z20822 Contact with and (suspected) exposure to covid-19: Secondary | ICD-10-CM | POA: Insufficient documentation

## 2020-01-25 DIAGNOSIS — M5432 Sciatica, left side: Secondary | ICD-10-CM

## 2020-01-25 DIAGNOSIS — M549 Dorsalgia, unspecified: Secondary | ICD-10-CM

## 2020-01-25 LAB — CBC WITH DIFFERENTIAL/PLATELET
Abs Immature Granulocytes: 0.03 10*3/uL (ref 0.00–0.07)
Basophils Absolute: 0 10*3/uL (ref 0.0–0.1)
Basophils Relative: 1 %
Eosinophils Absolute: 0.2 10*3/uL (ref 0.0–0.5)
Eosinophils Relative: 3 %
HCT: 42.7 % (ref 39.0–52.0)
Hemoglobin: 14.1 g/dL (ref 13.0–17.0)
Immature Granulocytes: 0 %
Lymphocytes Relative: 39 %
Lymphs Abs: 2.6 10*3/uL (ref 0.7–4.0)
MCH: 28.8 pg (ref 26.0–34.0)
MCHC: 33 g/dL (ref 30.0–36.0)
MCV: 87.3 fL (ref 80.0–100.0)
Monocytes Absolute: 0.6 10*3/uL (ref 0.1–1.0)
Monocytes Relative: 9 %
Neutro Abs: 3.2 10*3/uL (ref 1.7–7.7)
Neutrophils Relative %: 48 %
Platelets: 257 10*3/uL (ref 150–400)
RBC: 4.89 MIL/uL (ref 4.22–5.81)
RDW: 12.8 % (ref 11.5–15.5)
WBC: 6.8 10*3/uL (ref 4.0–10.5)
nRBC: 0 % (ref 0.0–0.2)

## 2020-01-25 LAB — BASIC METABOLIC PANEL
Anion gap: 8 (ref 5–15)
BUN: 13 mg/dL (ref 6–20)
CO2: 24 mmol/L (ref 22–32)
Calcium: 9.4 mg/dL (ref 8.9–10.3)
Chloride: 105 mmol/L (ref 98–111)
Creatinine, Ser: 0.83 mg/dL (ref 0.61–1.24)
GFR calc Af Amer: >60 mL/min (ref >60)
GFR calc non Af Amer: >60 mL/min (ref >60)
Glucose, Bld: 106 mg/dL — ABNORMAL HIGH (ref 70–99)
Potassium: 4.6 mmol/L (ref 3.5–5.1)
Sodium: 137 mmol/L (ref 135–145)

## 2020-01-25 LAB — SARS CORONAVIRUS 2 BY RT PCR (HOSPITAL ORDER, PERFORMED IN ~~LOC~~ HOSPITAL LAB): SARS Coronavirus 2: NEGATIVE

## 2020-01-25 IMAGING — CR DG LUMBAR SPINE COMPLETE 4+V
5 series · 5 of 5 positions shown · non-contrast
Comparison: CT abdomen pelvis [DATE]

CLINICAL DATA: Patient with low back pain. No recent injury
reported.

EXAM:
LUMBAR SPINE - COMPLETE 4+ VIEW

[t l-spine a.p.]
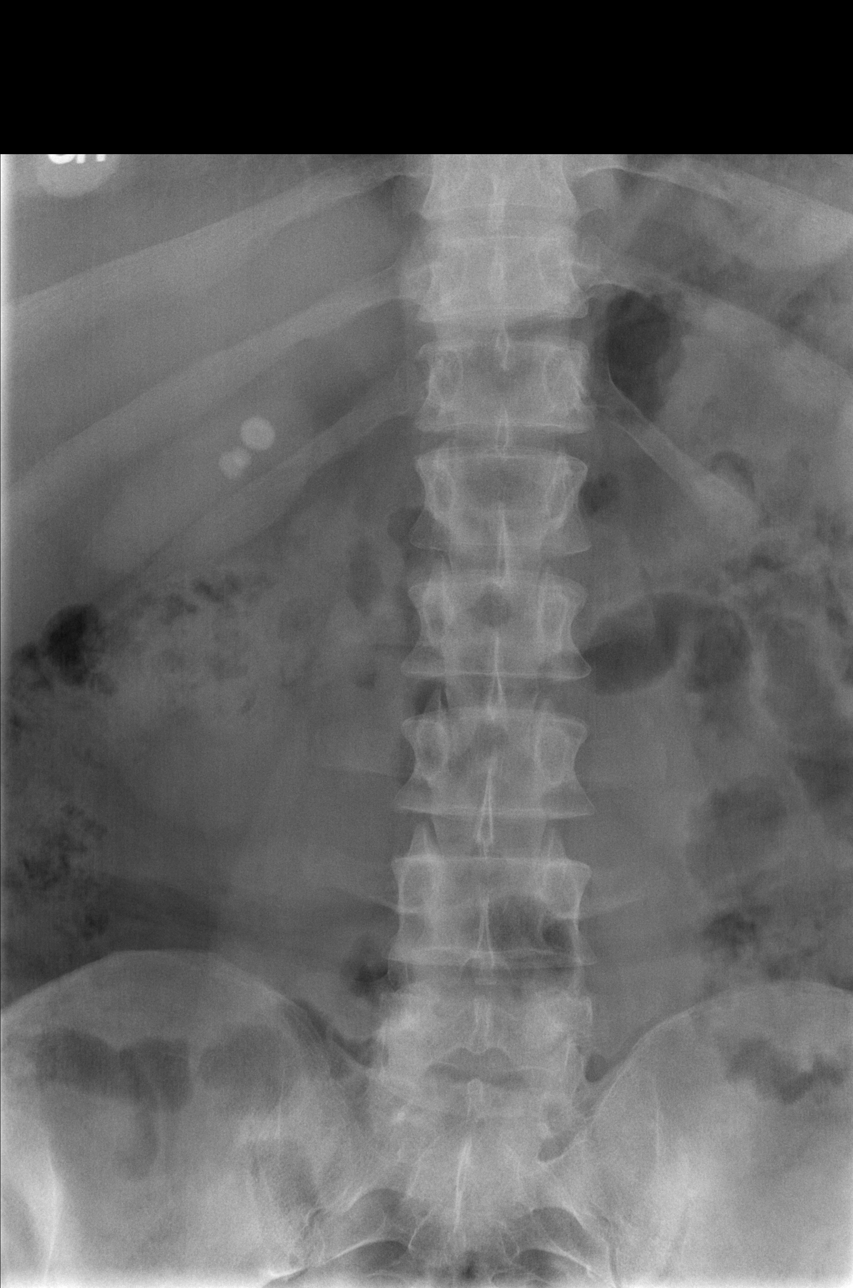

[t l-spine oblique exposure (1 of 2)]
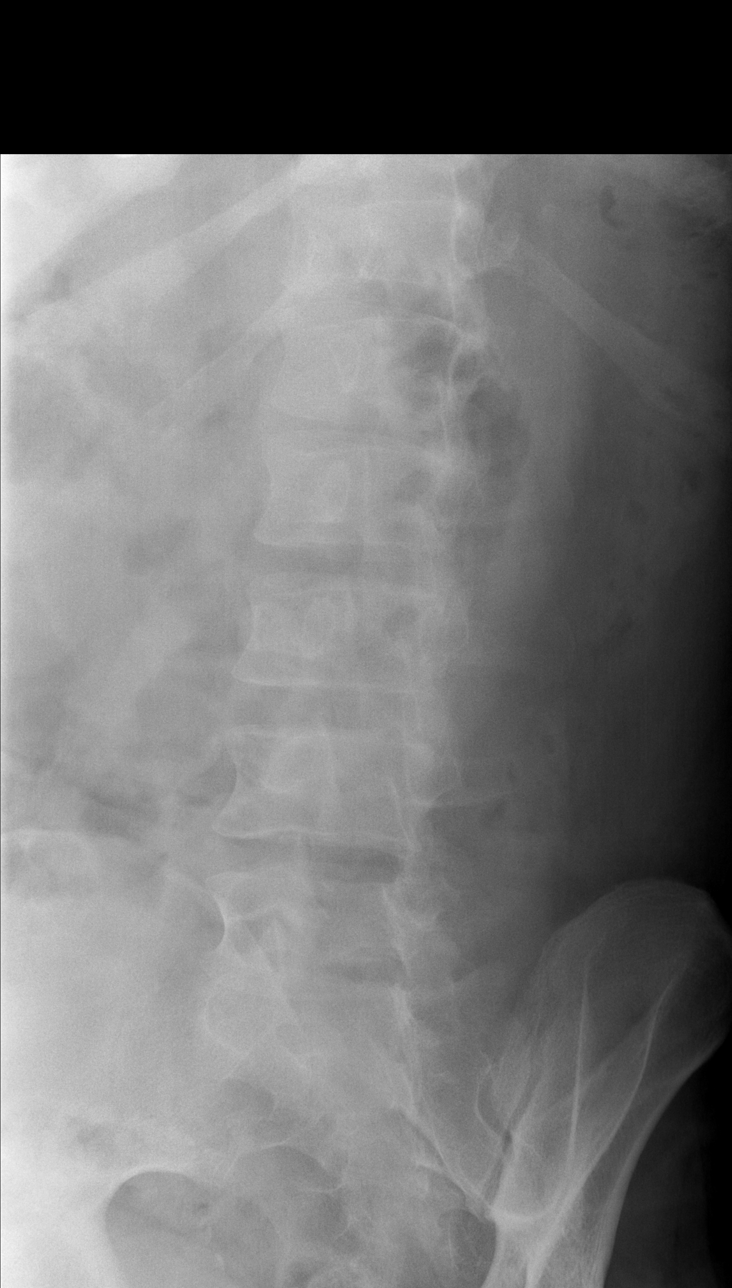

[t l-spine oblique exposure (2 of 2)]
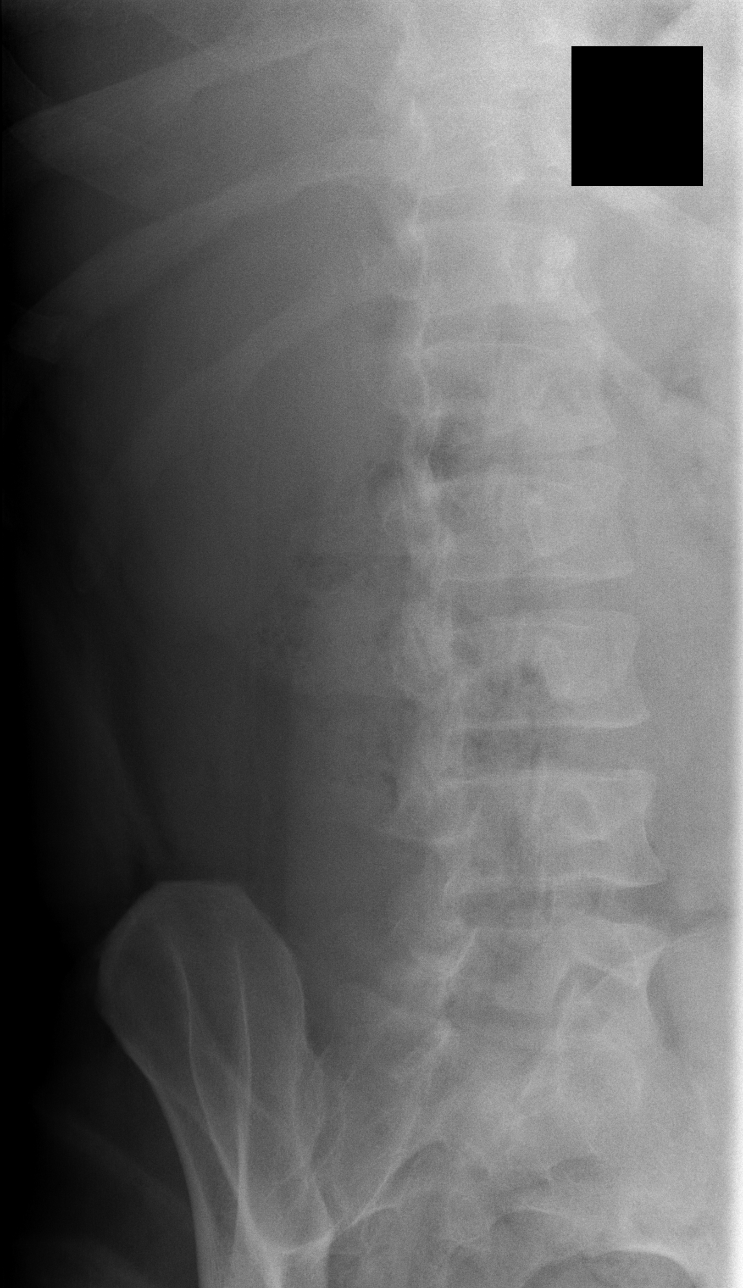

[t l-spine lat]
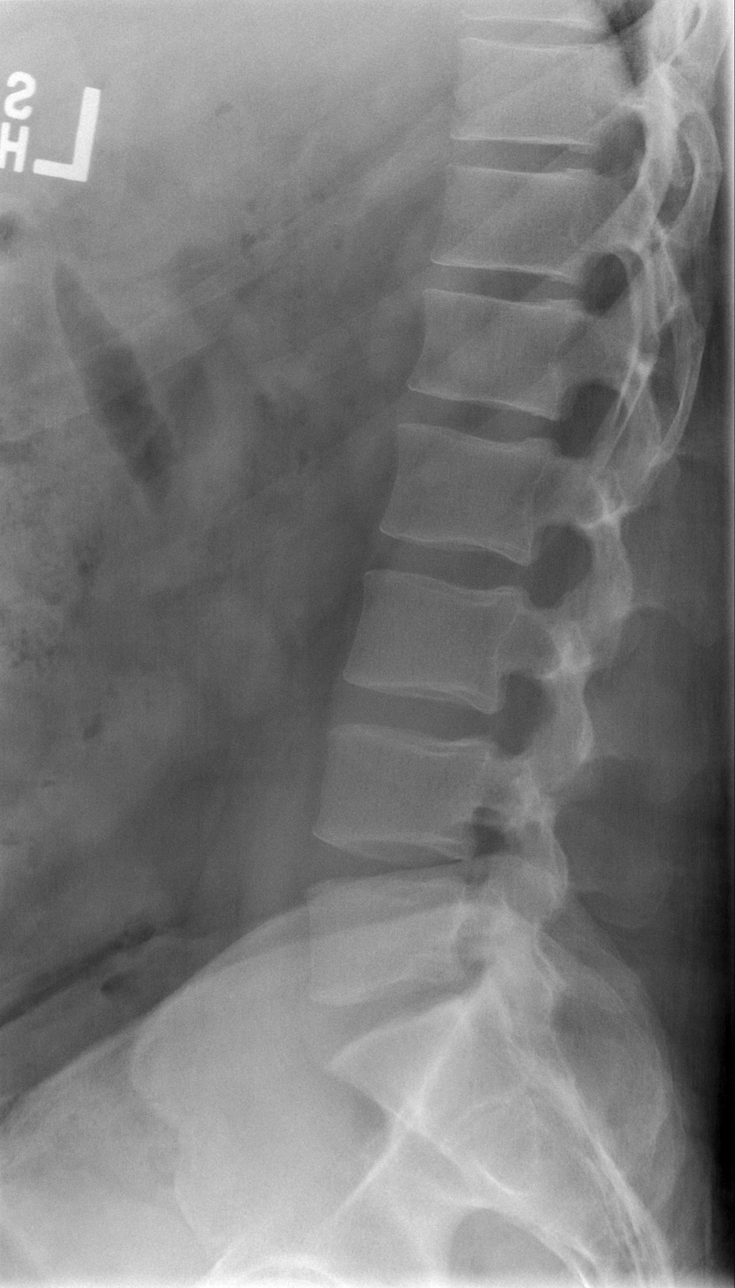

[t l-spine l5-s1 spot]
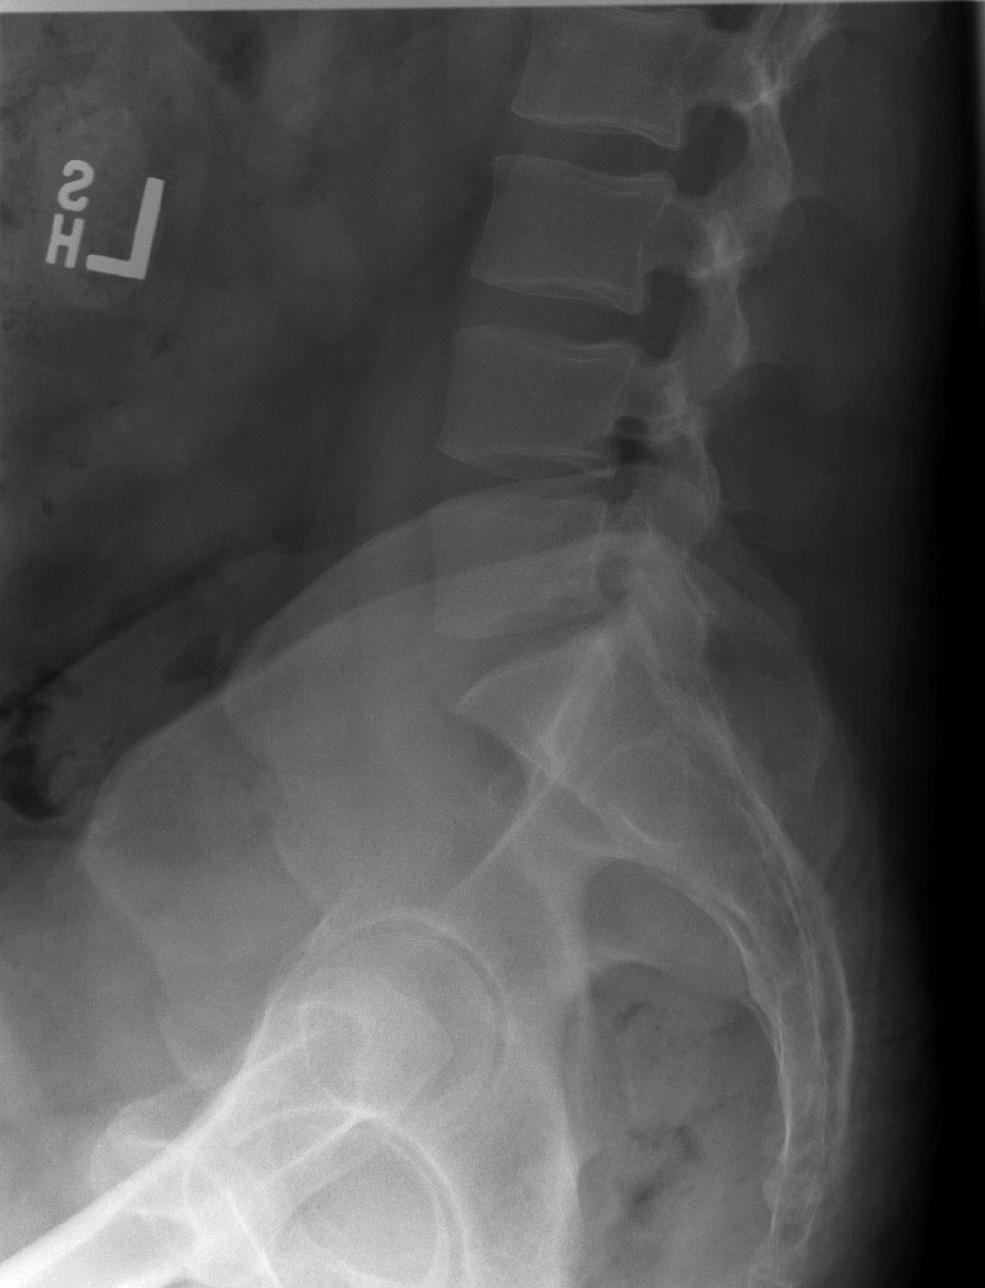

[5 of 5 positions shown; findings below may reference images not displayed]

FINDINGS: Normal anatomic alignment. No evidence for acute fracture or
dislocation. Preservation of the vertebral body and intervertebral
disc space heights. Lower lumbar spine facet degenerative changes.
Calcified gallstones right upper quadrant.
IMPRESSION: No acute osseous abnormality.  Mild degenerative changes.

## 2020-01-25 IMAGING — MR MR LUMBAR SPINE W/O CM
4 of 5 series · 19 of 48 positions shown · non-contrast
Comparison: MRI lumbar spine [DATE].

CLINICAL DATA: Intermittent back pain for 5 years. Acute onset
severe low back pain radiating into the left buttock this morning.
No recent injury.

EXAM:
MRI LUMBAR SPINE WITHOUT CONTRAST
TECHNIQUE: Multiplanar, multisequence MR imaging of the lumbar spine was
performed. No intravenous contrast was administered.

[Series 2: T2 · sagittal · 4.0mm · 0.47mm/px · 6 of 17 slices shown (1 of 2)]
[im 1/17]
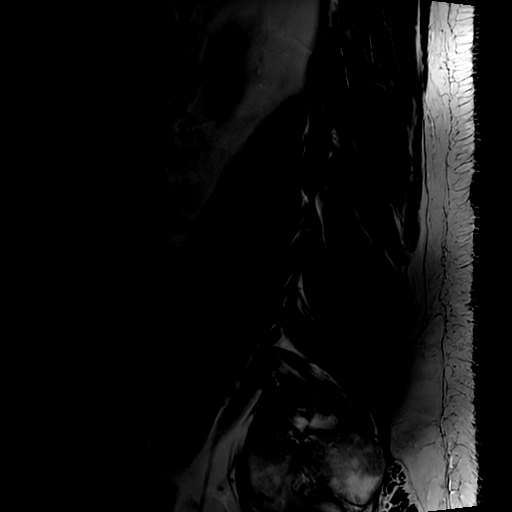
[im 4/17]
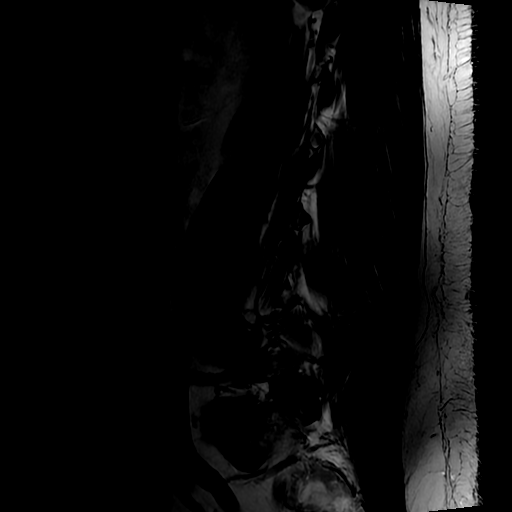
[im 7/17]
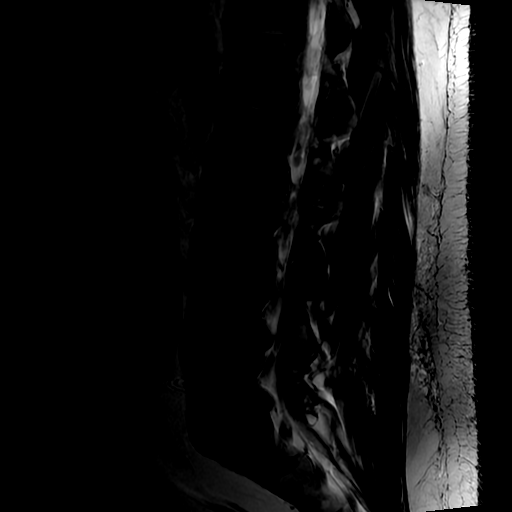
[im 10/17]
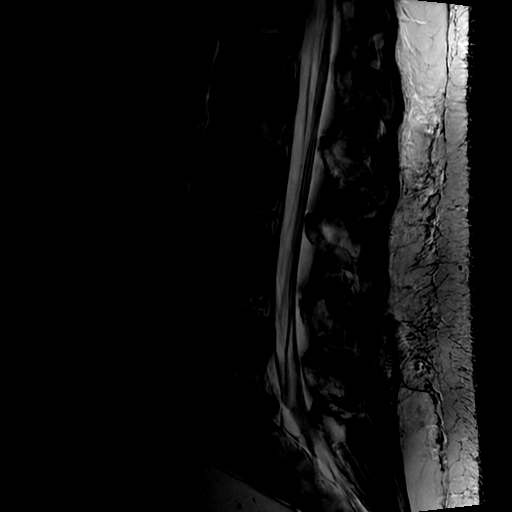
[im 13/17]
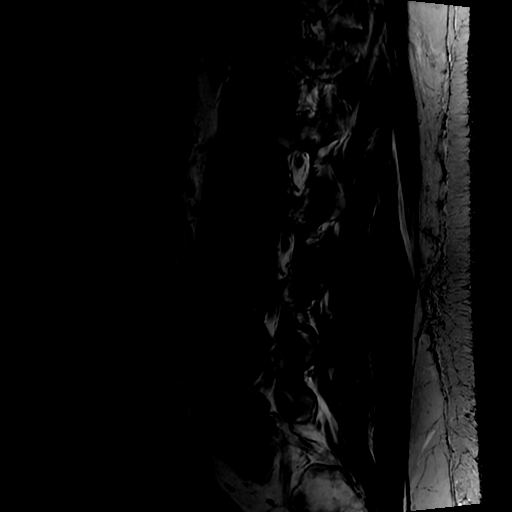
[im 17/17]
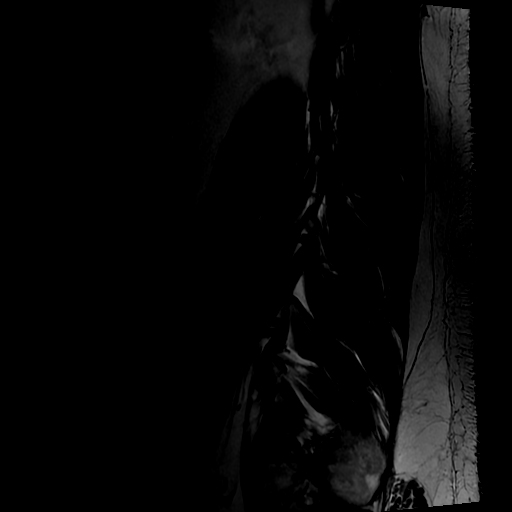

[Series 4: T1 · sagittal · 4.0mm · 0.47mm/px · 3 of 17 slices shown (1 of 2)]
[im 4/17]
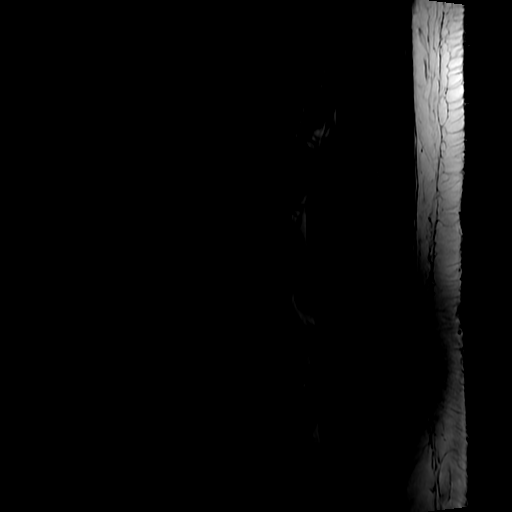
[im 10/17]
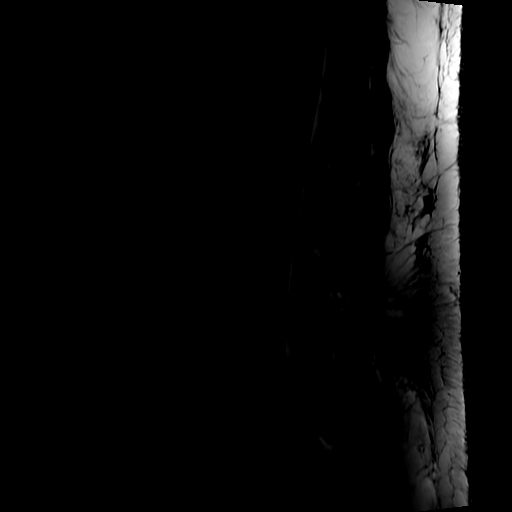
[im 17/17]
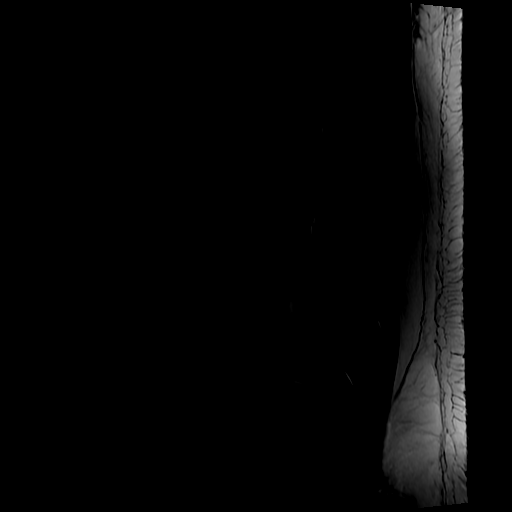

[Series 5: T2 · axial · 4.0mm · 0.39mm/px · z∈[+2,+187]mm · 7 of 44 slices shown (2 of 2)]
[im 1/44]
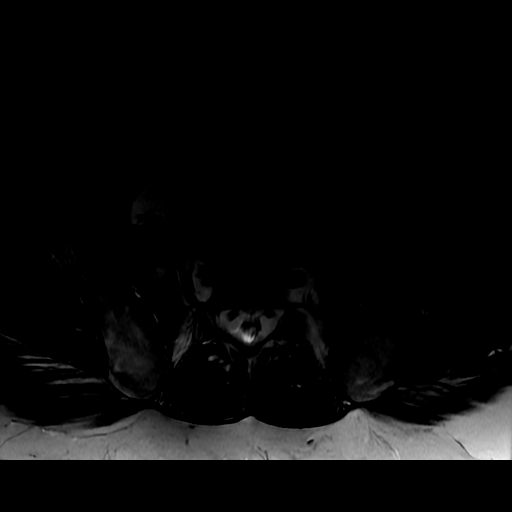
[im 7/44]
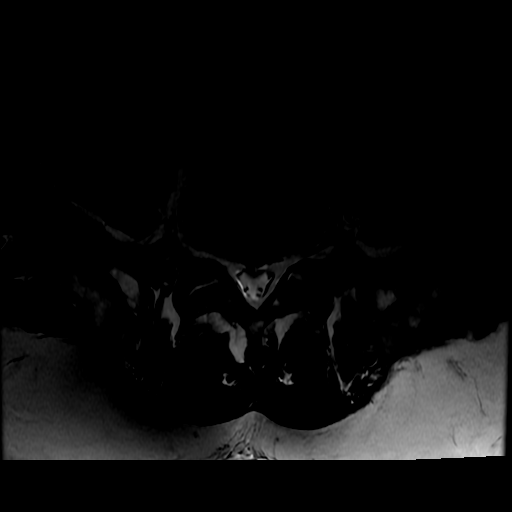
[im 13/44]
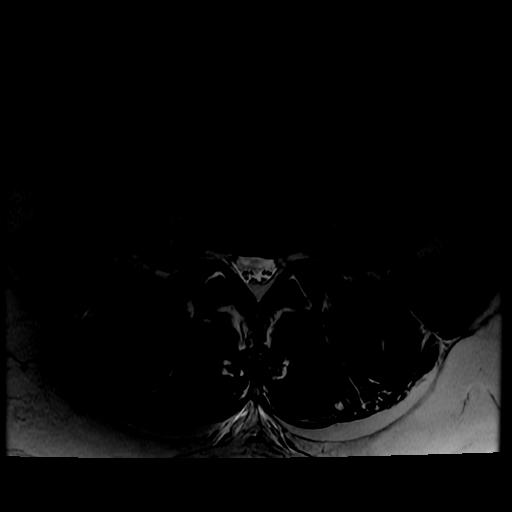
[im 19/44]
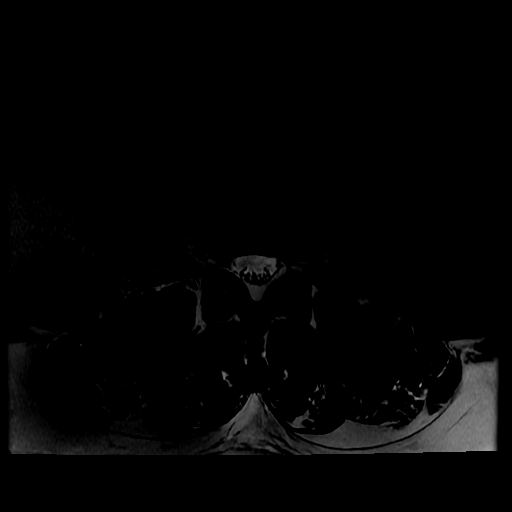
[im 22/44]
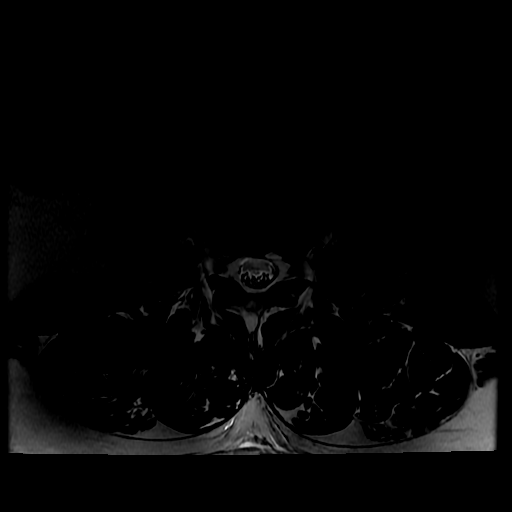
[im 25/44]
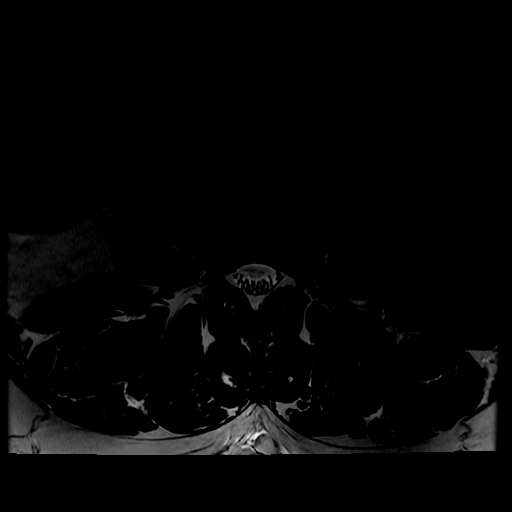
[im 37/44]
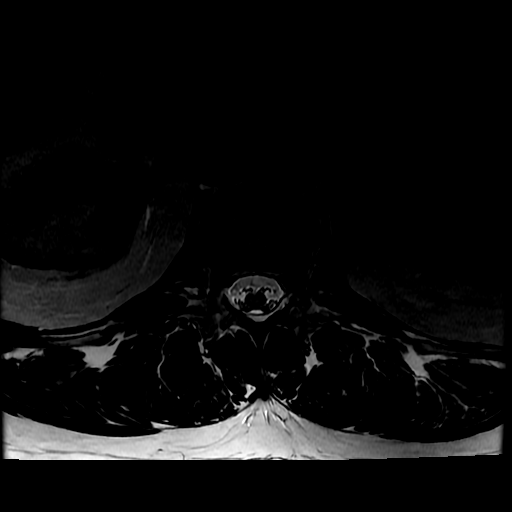

[Series 6: T1 · axial · 4.0mm · 0.39mm/px · z∈[+32,+187]mm · 3 of 45 slices shown (2 of 2)]
[im 7/45]
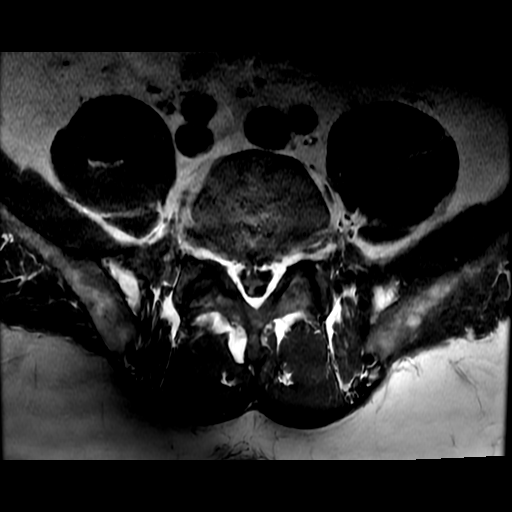
[im 23/45]
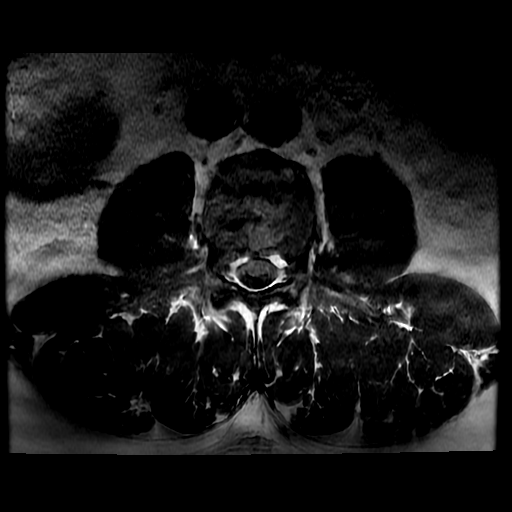
[im 38/45]
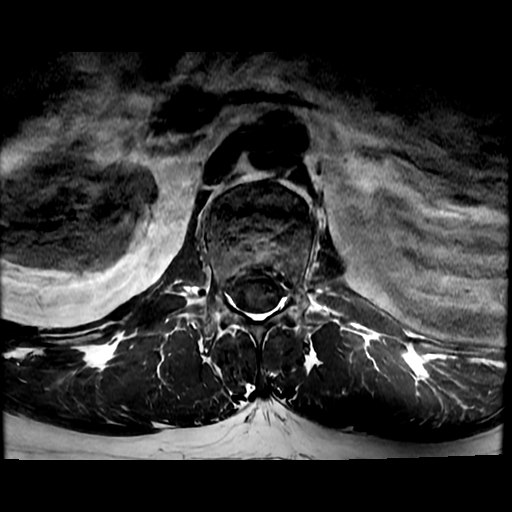

[19 of 48 positions shown; findings below may reference images not displayed]

FINDINGS: Segmentation:  Standard.

Alignment:  Normal.

Vertebrae:  No fracture, evidence of discitis, or bone lesion.

Conus medullaris and cauda equina: Conus extends to the L1-2 level.
Conus and cauda equina appear normal.

Paraspinal and other soft tissues: Negative.

Disc levels:

T12-L1, the T12-L1 to L3-4 levels are normal.

L4-5: Shallow disc bulge and annular fissure without stenosis,
unchanged.

L5-S1: Mild facet arthropathy and a shallow down turning central
protrusion without stenosis.
IMPRESSION: Mild degenerative disc disease at L4-5 and L5-S1 without stenosis.
The appearance of the lumbar spine is unchanged. No acute
abnormality

## 2020-01-25 MED ORDER — HYDROMORPHONE HCL 1 MG/ML IJ SOLN
1.0000 mg | Freq: Once | INTRAMUSCULAR | Status: AC
  Administered 2020-01-25: 1 mg via INTRAVENOUS
  Filled 2020-01-25: qty 1

## 2020-01-25 MED ORDER — MELOXICAM 7.5 MG PO TABS
7.5000 mg | ORAL_TABLET | Freq: Every day | ORAL | 0 refills | Status: DC
Start: 2020-01-25 — End: 2020-06-21

## 2020-01-25 MED ORDER — DEXAMETHASONE SODIUM PHOSPHATE 10 MG/ML IJ SOLN
10.0000 mg | Freq: Once | INTRAMUSCULAR | Status: AC
  Administered 2020-01-25: 10 mg via INTRAVENOUS
  Filled 2020-01-25: qty 1

## 2020-01-25 MED ORDER — KETOROLAC TROMETHAMINE 30 MG/ML IJ SOLN
30.0000 mg | Freq: Once | INTRAMUSCULAR | Status: AC
  Administered 2020-01-25: 30 mg via INTRAVENOUS
  Filled 2020-01-25: qty 1

## 2020-01-25 MED ORDER — LIDOCAINE 5 % EX PTCH: 1.0000 | MEDICATED_PATCH | CUTANEOUS | 0 refills | Status: DC

## 2020-01-25 MED ORDER — PREDNISONE 10 MG (21) PO TBPK
ORAL_TABLET | Freq: Every day | ORAL | 0 refills | Status: DC
Start: 2020-01-25 — End: 2020-02-06

## 2020-01-25 MED ORDER — METHOCARBAMOL 500 MG PO TABS
500.0000 mg | ORAL_TABLET | Freq: Three times a day (TID) | ORAL | 0 refills | Status: DC | PRN
Start: 2020-01-25 — End: 2020-06-21

## 2020-01-25 NOTE — ED Notes (Signed)
Pt to [facility] via [facility]

## 2020-01-25 NOTE — ED Notes (Signed)
Verbalized understanding of DC instructions, Rx, follow up care with ortho

## 2020-01-25 NOTE — ED Provider Notes (Signed)
This [facility] EMERGENCY DEPARTMENT Provider Note   CSN: [id number] Arrival date & time: 01/25/20  0734     History Chief Complaint  Patient presents with  . Back Pain    Joshua Brady is a 44 y.o. male.  HPI Patient has a history of intermittent back pain.  Typically resolves with some stretching or movement.  This has been a problem since he had a fall over 5 years ago.  He slipped on ice and landed directly on his back with some injury to the lower back.  He has never required surgery.  This morning, pain started as if typical pain but quickly escalated to severe pain in his left buttock just adjacent to his tailbone.  It was radiating into the left leg and so severe he could not put any weight on the left leg.  He reports it feels like the left leg has gone to sleep with tingling feeling.  He is also getting that tingling and numbness sensation to the right thigh.  He reports certain movements are excruciatingly painful.  He had to use a cane and have assistance to walk to the car to come to the emergency department.  He has not been having any bowel or bladder dysfunction.  He is been well.  No fevers no chills.  He has not been having difficulty urinating or pain or burning.  Patient has not had Covid vaccine.    Past Medical History:  Diagnosis Date  . Borderline high cholesterol 01/25/2017   10-yr ASCVD 4.1%  . Dyslipidemia (high LDL; low HDL) 01/25/2017   10-yr ASCVD risk 4.1% (01/2017)  . Head injury 2016  . Heart murmur     Patient Active Problem List   Diagnosis Date Noted  . Viral illness 09/11/2019  . Low testosterone 04/29/2018  . Sleep apnea 03/11/2018  . Daytime somnolence 03/11/2018  . 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 SOB (shortness of breath) on exertion 03/03/2018  . Palpitations 03/03/2018  . Borderline high cholesterol 01/25/2017  . Dyslipidemia (high LDL; low HDL) 01/25/2017  . History of brain concussion 01/21/2017  . History of low  back pain 01/21/2017    Past Surgical History:  Procedure Laterality Date  . Broken Arm    . LIVER BIOPSY         Family History  Problem Relation Age of Onset  . Alcohol abuse Father   . Other Neg Hx        low testosterone    Social History   Tobacco Use  . Smoking status: Never Smoker  . Smokeless tobacco: Never Used  Vaping Use  . Vaping Use: Never used  Substance Use Topics  . Alcohol use: Yes  . Drug use: No    Home Medications Prior to Admission medications   Medication Sig Start Date End Date Taking? Authorizing Provider  atorvastatin (LIPITOR) 10 MG tablet TAKE 1 TABLET BY MOUTH ONCE DAILY Patient not taking: Reported on 09/11/2019 02/10/19   Park Liter, MD    Allergies    Other, Prednisone, and Topiramate  Review of Systems   Review of Systems 10 systems reviewed and negative except as per HPI Physical Exam Updated Vital Signs BP (!) 146/95 (BP Location: Left Arm)   Pulse 70   Temp 97.6 F (36.4 C) (Oral)   Resp 18   Ht 6' (1.829 m)   Wt 122.5 kg   SpO2 98%   BMI 36.62 kg/m   Physical Exam Constitutional:  Comments: Alert and nontoxic.  Patient is uncomfortable.  No respiratory distress.  HENT:     Head: Normocephalic and atraumatic.  Eyes:     Extraocular Movements: Extraocular movements intact.  Cardiovascular:     Rate and Rhythm: Normal rate and regular rhythm.  Pulmonary:     Effort: Pulmonary effort is normal.     Breath sounds: Normal breath sounds.  Abdominal:     General: There is no distension.     Palpations: Abdomen is soft.     Tenderness: There is no abdominal tenderness. There is no guarding.  Musculoskeletal:     Comments: No peripheral edema.  Lateral pulses 2+ dorsalis pedis.  Feet are warm and dry.  Patient has intact dorsi flexion and extension of feet.  He can elevate each leg off the bed independently however severe pain limiting.  Positive straight leg raise bilaterally.  Skin:    General: Skin is  warm and dry.  Neurological:     Mental Status: He is oriented to person, place, and time.     Cranial Nerves: No cranial nerve deficit.     Sensory: No sensory deficit.     Coordination: Coordination normal.  Psychiatric:        Mood and Affect: Mood normal.     ED Results / Procedures / Treatments   Labs (all labs ordered are listed, but only abnormal results are displayed) Labs Reviewed  SARS CORONAVIRUS 2 BY RT PCR (HOSPITAL ORDER, [facility])  CBC WITH DIFFERENTIAL/PLATELET  BASIC METABOLIC PANEL    EKG None  Radiology DG Lumbar Spine Complete  Result Date: 01/25/2020 CLINICAL DATA:  Patient with low back pain. No recent injury reported. EXAM: LUMBAR SPINE - COMPLETE 4+ VIEW COMPARISON:  CT abdomen pelvis 10/04/2018 FINDINGS: Normal anatomic alignment. No evidence for acute fracture or dislocation. Preservation of the vertebral body and intervertebral disc space heights. Lower lumbar spine facet degenerative changes. Calcified gallstones right upper quadrant. IMPRESSION: No acute osseous abnormality.  Mild degenerative changes. Electronically Signed   By: Lovey Newcomer M.D.   On: 01/25/2020 09:14    Procedures Procedures (including critical care time)  Medications Ordered in ED Medications  HYDROmorphone (DILAUDID) injection 1 mg (1 mg Intravenous Given 01/25/20 0930)  dexamethasone (DECADRON) injection 10 mg (10 mg Intravenous Given 01/25/20 0924)  ketorolac (TORADOL) 30 MG/ML injection 30 mg (30 mg Intravenous Given 01/25/20 1601)    ED Course  I have reviewed the triage vital signs and the nursing notes.  Pertinent labs & imaging results that were available during my care of the patient were reviewed by me and considered in my medical decision making (see chart for details).    MDM Rules/Calculators/A&P                          Patient presents with acute onset of severe back pain consistent with lumbar radiculopathy and associated  paresthesias of both legs.  At this time, he does have normal vascular exam.  Patient does have strength to move the extremities but is significantly pain limited.  Lumbar x-rays do not show acute abnormality.  Will need to proceed with MRI.  No MRI available at this facility today.  Will transfer to [facility] emergency department.  Case reviewed with Franchot Heidelberg for Dr. Pryor Curia excepting for transfer.  If patient pain adequately controlled and MRI without acute, surgical findings, anticipate patient will be stable for discharge  with steroid and pain control. Final Clinical Impression(s) / ED Diagnoses Final diagnoses:  Lumbar radiculopathy, acute  Left leg weakness    Rx / DC Orders ED Discharge Orders    None       Charlesetta Shanks, MD 01/25/20 [phone]

## 2020-01-25 NOTE — ED Notes (Signed)
Report to [place] charge at [facility]

## 2020-01-25 NOTE — ED Notes (Signed)
ED Provider at bedside. 

## 2020-01-25 NOTE — ED Provider Notes (Signed)
  Physical Exam  BP (!) 151/82 (BP Location: Right Arm)   Pulse 65   Temp 98 F (36.7 C) (Oral)   Resp 16   Ht 6' (1.829 m)   Wt 122.5 kg   SpO2 98%   BMI 36.62 kg/m   Physical Exam  ED Course/Procedures     Procedures  MDM   Patient transferred from [facility] ED for MRI of the lumbar spine.  Please see previous notes for further history.  In brief, patient presenting for evaluation of worsening low back pain.  It radiates down to his right leg.  He states his leg feels heavy and numb.  He reports history of low back pain since an injury 4 years ago, but never to this extent.  He denies fevers, chills, nausea, vomiting, abdominal pain, loss of bowel bladder control.  He denies history of cancer, IVDU.  He does not currently have an orthopedic doctor.  On exam, patient without obvious weakness, however limited range of motion of the legs due to pain.  Sensation equal bilaterally.  No saddle paresthesias.  Pedal pulses 2+ bilaterally.  MRI shows degeneration, no stenosis.  No infection, mass, or neurosurgical emergent condition.  Discussed findings with patient.  Discussed plan for ambulation and symptomatic treatment at home.  Patient ambulated, required minimal assistance mostly due to pain.  However able to ambulate safely.  He has both a cane and a walker at home to help with assistance.  Discussed antibiotic treatment.  Discussed follow-up with orthopedics.  At this time, patient appears safe for discharg.  Return precautions given.  Patient states he understands and agrees to plan.         Franchot Heidelberg, PA-C 01/25/20 1712    Pattricia Boss, MD 01/29/20 [phone]

## 2020-01-25 NOTE — Discharge Instructions (Signed)
Take meloxicam once a day with meals.  Do not take other anti-inflammatories at the same time (Advil, Motrin, ibuprofen, Aleve). You may supplement with Tylenol if you need further pain control. Take prednisone as prescribed.  Take the entire course. Use Robaxin as needed for muscle stiffness or soreness. Have caution, as this may make you tired or groggy. Do not drive or operate heavy machinery while taking this medication.  Use the lidocaine patch to help with pain control. Follow up with orthopedics if pain is not improving with this treatment.  Return to the ER if you develop high fevers, numbness, loss of bowel or bladder control, or any new or concerning symptoms.

## 2020-01-25 NOTE — ED Notes (Signed)
Patient transported to X-ray 

## 2020-01-25 NOTE — ED Triage Notes (Signed)
Pt reports lower back pain starting this morning denies recent injury.

## 2020-01-25 NOTE — ED Notes (Signed)
Pt requested  More meds for pain which was given before transport

## 2020-01-26 MED FILL — MELOXICAM 7.5 MG TABLET: 7.5 | 20 days supply | Qty: 20 | Fill #0

## 2020-01-26 MED FILL — METHOCARBAMOL 500 MG TABS: 500 | 7 days supply | Qty: 20 | Fill #0

## 2020-01-26 MED FILL — LIDOCAINE PATCH 5%: 5 | 30 days supply | Qty: 30 | Fill #0

## 2020-01-26 MED FILL — predniSONE 10 MG TABS: 10 | 12 days supply | Qty: 42 | Fill #0

## 2020-02-06 ENCOUNTER — Ambulatory Visit (INDEPENDENT_AMBULATORY_CARE_PROVIDER_SITE_OTHER): Payer: No Typology Code available for payment source | Admitting: Sports Medicine

## 2020-02-06 ENCOUNTER — Encounter: Payer: Self-pay | Admitting: Sports Medicine

## 2020-02-06 DIAGNOSIS — M51369 Other intervertebral disc degeneration, lumbar region without mention of lumbar back pain or lower extremity pain: Secondary | ICD-10-CM | POA: Insufficient documentation

## 2020-02-06 DIAGNOSIS — M5136 Other intervertebral disc degeneration, lumbar region: Secondary | ICD-10-CM | POA: Diagnosis not present

## 2020-02-06 HISTORY — DX: Other intervertebral disc degeneration, lumbar region without mention of lumbar back pain or lower extremity pain: M51.369

## 2020-02-06 HISTORY — DX: Other intervertebral disc degeneration, lumbar region: M51.36

## 2020-02-06 NOTE — Assessment & Plan Note (Signed)
This is a very pleasant 44 year old male, he is a Astronomer cop. For some time now he has had pain in his low back, axial with occasional radiation down the right leg but nothing overtly radicular to the foot. He was seen in the ED, given prednisone, Robaxin, had some improvement. Interestingly an MRI was obtained in the ED. The MRI was personally reviewed and does show L4-L5 and L5-S1 disc desiccation with protrusions, there does appear to be some contact of the L5-S1 disc with the intraspinal right L5 nerve root. We will start with aggressive formal physical therapy, I did discuss the evolutionary anthropology of disc disease with him, return to see me in 6 weeks, epidurals if no better, he has had epidurals in the cervical spine.

## 2020-02-06 NOTE — Progress Notes (Signed)
    Procedures performed today:    None.  Independent interpretation of notes and tests performed by another provider:   None.  Brief History, Exam, Impression, and Recommendations:    Darl Kuss is a pleasant 44yo male who presented today with lower back and right leg pain. He says that it has been hurting for while now but has been increasing in pain with work. He works at [employer] and has had to help with move in which has been a lot of standing and walking. The pain is mainly on the left lower back and that he feels that his right leg. A previous MRI of his back on 8/12 showed lumbar DDD of his L4-5 and L5-S1. At this time he was prescribed a prednisone taper and muscle relaxer from the ED. The pain improved slightly with the prednisone. We are going to start conservatively today with aggressive formal PT. He will return to see [place] in 6 weeks for reevaluation and if not better we will discuss and epidural.   Marcelino Duster, MS3   ___________________________________________ Gwen Her. Dianah Field, M.D., ABFM., CAQSM. Primary Care and [place] Instructor of [place] of [facility]

## 2020-02-13 ENCOUNTER — Other Ambulatory Visit: Payer: Self-pay

## 2020-02-13 ENCOUNTER — Encounter: Payer: Self-pay | Admitting: Rehabilitative and Restorative Service Providers"

## 2020-02-13 ENCOUNTER — Ambulatory Visit (INDEPENDENT_AMBULATORY_CARE_PROVIDER_SITE_OTHER): Payer: No Typology Code available for payment source | Admitting: Rehabilitative and Restorative Service Providers"

## 2020-02-13 DIAGNOSIS — R29898 Other symptoms and signs involving the musculoskeletal system: Secondary | ICD-10-CM | POA: Diagnosis not present

## 2020-02-13 DIAGNOSIS — M6281 Muscle weakness (generalized): Secondary | ICD-10-CM

## 2020-02-13 DIAGNOSIS — M5136 Other intervertebral disc degeneration, lumbar region: Secondary | ICD-10-CM

## 2020-02-13 DIAGNOSIS — M5440 Lumbago with sciatica, unspecified side: Secondary | ICD-10-CM

## 2020-02-13 NOTE — Therapy (Signed)
[name], [name], [address] Phone: [phone]   Fax:  [phone]  Physical Therapy Evaluation  Patient Details  Name: Joshua Brady MRN: [id number] Date of Birth: [birth year] Referring Provider (PT): Dr Dianah Field    Encounter Date: 02/13/2020   PT End of Session - 02/13/20 1219    Visit Number 1    Number of Visits 12    Date for PT Re-Evaluation 03/26/20    PT Start Time 0852    PT Stop Time 0935    PT Time Calculation (min) 43 min    Activity Tolerance Patient tolerated treatment well           Past Medical History:  Diagnosis Date   Borderline high cholesterol 01/25/2017   10-yr ASCVD 4.1%   Dyslipidemia (high LDL; low HDL) 01/25/2017   10-yr ASCVD risk 4.1% (01/2017)   Head injury 2016   Heart murmur     Past Surgical History:  Procedure Laterality Date   Broken Arm     LIVER BIOPSY      There were no vitals filed for this visit.    Subjective Assessment - 02/13/20 0900    Subjective Patient reports that he awoke with back pain ~ 3 weeks ago with sudden onset of pain. He was unable to move - was seen in the ED and treated wit medication with some improvement. He was in a w/c or with a cane for a few days and has increased activity over the past couple of weeks.    Pertinent History injury to LB and cervical spine due to fall ~ 4 yrs ago with PT for LB and injections in cervical spine - some irritation on an intermittent basis ~ 1 time a month resolves with movement and OTC meds    Patient Stated Goals get rid of the LBP; avoid injections    Currently in Pain? Yes    Pain Score 3     Pain Location Back    Pain Orientation Lower;Left    Pain Descriptors / Indicators Pressure;Nagging    Pain Type Acute pain    Pain Radiating Towards Rt upper anterior thigh    Pain Onset 1 to 4 weeks ago    Pain Frequency Constant    Aggravating Factors  anything    Pain Relieving  Factors sitting; heat; meds              [facility] PT Assessment - 02/13/20 0001      Assessment   Medical Diagnosis Lumbar DDD     Referring Provider (PT) Dr Dianah Field     Onset Date/Surgical Date 01/25/20    Hand Dominance Right    Next MD Visit to schedule post PT x 6 wks     Prior Therapy yes for LBP ~ 4 yrs ago       Precautions   Precautions None      Restrictions   Weight Bearing Restrictions No      Balance Screen   Has the patient fallen in the past 6 months No    Has the patient had a decrease in activity level because of a fear of falling?  No    Is the patient reluctant to leave their home because of a fear of falling?  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 worked at [employer] x 5 yrs     Leisure wood working; hunting; fishing; hiking with son; yard work      Observation/Other Assessments   Focus on Therapeutic Outcomes (FOTO)  56% limitation       Sensation   Additional Comments intermittent anterior Rt thigh to knee - none since yesterday       Posture/Postural Control   Posture Comments flexed forward; sitting with upper body flexed forward; LE crossed -       AROM   Right/Left Hip --   end range tightness bilat - esp in hip extension    Lumbar Flexion 65% pulling pain Lt LB    Lumbar Extension 10% pain Lt LB    Lumbar - Right Side Bend 60% pain Lt LB    Lumbar - Left Side Bend 65% stretching     Lumbar - Right Rotation 30% discomfort     Lumbar - Left Rotation 25% discomfort Lt LB > Rt rotation       Strength   Overall Strength Comments weak core observed with transitional movements; neck and head flexion in supine       Flexibility   Hamstrings tight bilat ~ 40 deg Lt 45 deg Rt     Quadriceps tight bilat     ITB tight Lt > Rt     Piriformis tight Lt > Rt       Palpation   Spinal mobility hypomobile  with CPA and Lt lateral spring testing L3/4/5     SI assessment  tightness Lt SI region     Palpation comment muscular tightness Lt lats; QL; lower lumbar paraspinals; Lt piriformis to gluts; Lt > Rt psoas       Special Tests   Other special tests (-) slump test with overpressure; (-) SLR; (-) FABERS       Transfers   Comments difficulty with transfers and transitional movments demonstrating poor body mechanics with movement                       Objective measurements completed on examination: See above findings.       [facility] PT Treatment/Exercise - 02/13/20 0001      Therapeutic Activites    Therapeutic Activities --   initiated back care education      Lumbar Exercises: Stretches   Passive Hamstring Stretch Right;Left;2 reps;30 seconds   supine with strap    Standing Extension 2 reps   2-3 sec patient only achieving neutral lumbar curve    Standing Extension Limitations note decreased lumbar lordosis in standing     Prone on Elbows Stretch 1 rep;60 seconds    ITB Stretch Right;Left;2 reps;30 seconds   supine with strap    Piriformis Stretch Right;Left;2 reps;30 seconds   supine travell      Lumbar Exercises: Supine   AB Set Limitations iniitated 2 part core - pelvic floor and transverse abdominals 10 sec hold x 10 reps                   PT Education - 02/13/20 0936    Education Details HEP POC DN posture TENS    Person(s) Educated Patient    Methods Explanation;Demonstration;Tactile cues;Verbal cues;Handout    Comprehension Verbalized understanding;Returned demonstration;Verbal cues required;Tactile cues required               PT Long Term Goals - 02/13/20 1229  PT LONG TERM GOAL #1   Title Decrease LBP and LE radicular pain by 75-100% allowing patient to return to all normal functional activities    Time 6    Period Weeks    Status New    Target Date 03/26/20      PT LONG TERM GOAL #2   Title Increase mobility and ROM through  lumbar spine to ~ 70-75% of range throughout with no pain    Time 6    Period Weeks    Status New    Target Date 03/26/20      PT LONG TERM GOAL #3   Title Improve core strength and stability progressing with strengthening program to prevent recurrent LBP    Time 6    Period Weeks    Status New    Target Date 03/26/20      PT LONG TERM GOAL #4   Title Independent in HEP    Time 6    Period Weeks    Status New    Target Date 03/26/20      PT LONG TERM GOAL #5   Title Improve FOTO to </= 36% limitation    Time 6    Period Weeks    Status New    Target Date 03/26/20                  Plan - 02/13/20 1222    Clinical Impression Statement Patient presents with ~ 3 week history of acute LBP with resolving LE radicular symptoms. He has a history of LB and cervical spine injuries as well as concusion when he fell on ice ~ 4 years ago. He underwent a course of PT with good resolution of symptoms however he continued to have some LBP and discomfort ~ 1 time/month which resolved with OTC meds and rest and did not result in a change in his activity level. This recent episode was sever with no known cause. Patient presents with poor posture and alignment; limited trunk and LE mobility/ROM; pain with lumbar movement; pain with palpation through the Lt lumbar to posterior Lt hip/buttock; poor core staility and strength. Patient will benefit from PT to address problems identified.    Clinical Decision Making Low    Rehab Potential Good    PT Frequency 2x / week    PT Duration 6 weeks    PT Treatment/Interventions Patient/family education;ADLs/Self Care Home Management;Aquatic Therapy;Cryotherapy;Electrical Stimulation;Iontophoresis 4mg /ml Dexamethasone;Moist Heat;Ultrasound;Gait training;Stair training;Functional mobility training;Therapeutic activities;Therapeutic exercise;Balance training;Neuromuscular re-education;Manual techniques;Dry needling;Taping    PT Next Visit Plan review HEP;  add hip flexor, lat, calf stretches; progress with core stabiization and strengthening; trial of DN vs deep tissue work Lt LB to posterior hip (lats/QL/lumbar paraspinals/piriformis/glut med) modalities as indicated; possible trial of TENs for home use    PT Home Exercise Plan [email or web address]    Consulted and Agree with Plan of Care Patient           Patient will benefit from skilled therapeutic intervention in order to improve the following deficits and impairments:     Visit Diagnosis: DDD (degenerative disc disease), lumbar - Plan: PT plan of care cert/re-cert  Acute left-sided low back pain with sciatica, sciatica laterality unspecified - Plan: PT plan of care cert/re-cert  Muscle weakness (generalized) - Plan: PT plan of care cert/re-cert  Other symptoms and signs involving the musculoskeletal system - Plan: PT plan of care cert/re-cert     Problem List Patient Active Problem List   Diagnosis Date Noted  Lumbar degenerative disc disease 02/06/2020   Viral illness 09/11/2019   Low testosterone 04/29/2018   Sleep apnea 03/11/2018   Daytime somnolence 03/11/2018   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

## 2020-02-13 NOTE — Patient Instructions (Signed)
Access Code: [email or web address]: [email or web address]: 08/31/2021Prepared by: Caio Devera HoltExercises  Prone Press Up on Elbows - 2 x daily - 7 x weekly - 1 sets - 3 reps - 30-60 sec hold  Standing Lumbar Extension - 2 x daily - 7 x weekly - 1 sets - 2-3 reps - 2-3 sec hold  Hooklying Hamstring Stretch with Strap - 2 x daily - 7 x weekly - 1 sets - 3 reps - 30 sec hold  Supine Transversus Abdominis Bracing with Pelvic Floor Contraction - 2 x daily - 7 x weekly - 1 sets - 10 reps - 10sec hold Patient Education  TENS Unit  Posture and Body Mechanics  Trigger Point Dry Needling

## 2020-02-15 ENCOUNTER — Encounter: Payer: No Typology Code available for payment source | Admitting: Rehabilitative and Restorative Service Providers"

## 2020-02-16 ENCOUNTER — Ambulatory Visit (INDEPENDENT_AMBULATORY_CARE_PROVIDER_SITE_OTHER): Payer: No Typology Code available for payment source | Admitting: Family Medicine

## 2020-02-16 DIAGNOSIS — Z01812 Encounter for preprocedural laboratory examination: Secondary | ICD-10-CM

## 2020-02-16 DIAGNOSIS — Z20822 Contact with and (suspected) exposure to covid-19: Secondary | ICD-10-CM

## 2020-02-16 NOTE — Progress Notes (Signed)
Daughter tested positive for Covid earlier this week.  He is currently asymptomatic.  Wife is symptomatic.  He needs to get tested to be able to return to work.  Beatrice Lecher, MD

## 2020-02-16 NOTE — Progress Notes (Signed)
Lab only 

## 2020-02-18 ENCOUNTER — Encounter: Payer: Self-pay | Admitting: Nurse Practitioner

## 2020-02-18 ENCOUNTER — Telehealth: Payer: Self-pay | Admitting: Nurse Practitioner

## 2020-02-18 ENCOUNTER — Other Ambulatory Visit: Payer: Self-pay | Admitting: Nurse Practitioner

## 2020-02-18 DIAGNOSIS — E663 Overweight: Secondary | ICD-10-CM

## 2020-02-18 DIAGNOSIS — U071 COVID-19: Secondary | ICD-10-CM

## 2020-02-18 DIAGNOSIS — Z6825 Body mass index (BMI) 25.0-25.9, adult: Secondary | ICD-10-CM

## 2020-02-18 DIAGNOSIS — E785 Hyperlipidemia, unspecified: Secondary | ICD-10-CM

## 2020-02-18 LAB — NOVEL CORONAVIRUS, NAA: SARS-CoV-2, NAA: NOT DETECTED

## 2020-02-18 NOTE — Progress Notes (Signed)
Positive household contacts with COVID-19. Test 02/16/2020 was negative, but symptom onset started last night. Discussed the option to infuse based on BMI >25 and HLD with positive COVID household members and symptoms starting after testing.  Patient placed on schedule for 02/19/2020 at 1030

## 2020-02-18 NOTE — Telephone Encounter (Signed)
I connected by phone with Joshua Brady on 02/18/2020 at 12:56 PM to discuss the potential use of a new treatment for mild to moderate COVID-19 viral infection in non-hospitalized patients.  This patient is a 44 y.o. male that meets the FDA criteria for Emergency Use Authorization of COVID monoclonal antibody casirivimab/imdevimab.  Has a (+) direct SARS-CoV-2 viral test result  Has mild or moderate COVID-19   Is NOT hospitalized due to COVID-19  Is within 10 days of symptom onset  Has at least one of the high risk factor(s) for progression to severe COVID-19 and/or hospitalization as defined in EUA.  Specific high risk criteria : BMI > 25 and Cardiovascular disease or hypertension   I have spoken and communicated the following to the patient or parent/caregiver regarding COVID monoclonal antibody treatment:  1. FDA has authorized the emergency use for the treatment of mild to moderate COVID-19 in adults and pediatric patients with positive results of direct SARS-CoV-2 viral testing who are 95 years of age and older weighing at least 40 kg, and who are at high risk for progressing to severe COVID-19 and/or hospitalization.  2. The significant known and potential risks and benefits of COVID monoclonal antibody, and the extent to which such potential risks and benefits are unknown.  3. Information on available alternative treatments and the risks and benefits of those alternatives, including clinical trials.  4. Patients treated with COVID monoclonal antibody should continue to self-isolate and use infection control measures (e.g., wear mask, isolate, social distance, avoid sharing personal items, clean and disinfect "high touch" surfaces, and frequent handwashing) according to CDC guidelines.   5. The patient or parent/caregiver has the option to accept or refuse COVID monoclonal antibody treatment.  After reviewing this information with the patient, The patient agreed to proceed with  receiving casirivimab\imdevimab infusion and will be provided a copy of the Fact sheet prior to receiving the infusion. [name] 02/18/2020 12:56 PM

## 2020-02-19 ENCOUNTER — Ambulatory Visit (HOSPITAL_COMMUNITY)
Admission: RE | Admit: 2020-02-19 | Discharge: 2020-02-19 | Disposition: A | Discharge status: Home / Self Care | Payer: No Typology Code available for payment source | Source: Ambulatory Visit | Attending: Pulmonary Disease | Admitting: Pulmonary Disease

## 2020-02-19 DIAGNOSIS — U071 COVID-19: Secondary | ICD-10-CM | POA: Diagnosis present

## 2020-02-19 DIAGNOSIS — Z6825 Body mass index (BMI) 25.0-25.9, adult: Secondary | ICD-10-CM | POA: Insufficient documentation

## 2020-02-19 DIAGNOSIS — E663 Overweight: Secondary | ICD-10-CM | POA: Diagnosis not present

## 2020-02-19 DIAGNOSIS — E785 Hyperlipidemia, unspecified: Secondary | ICD-10-CM

## 2020-02-19 MED ORDER — ALBUTEROL SULFATE HFA 108 (90 BASE) MCG/ACT IN AERS: 2.0000 | INHALATION_SPRAY | Freq: Once | RESPIRATORY_TRACT | Status: DC | PRN

## 2020-02-19 MED ORDER — SODIUM CHLORIDE 0.9 % IV SOLN: INTRAVENOUS | Status: DC | PRN

## 2020-02-19 MED ORDER — DIPHENHYDRAMINE HCL 50 MG/ML IJ SOLN: 50.0000 mg | Freq: Once | INTRAMUSCULAR | Status: DC | PRN

## 2020-02-19 MED ORDER — METHYLPREDNISOLONE SODIUM SUCC 125 MG IJ SOLR: 125.0000 mg | Freq: Once | INTRAMUSCULAR | Status: DC | PRN

## 2020-02-19 MED ORDER — FAMOTIDINE IN NACL 20-0.9 MG/50ML-% IV SOLN: 20.0000 mg | Freq: Once | INTRAVENOUS | Status: DC | PRN

## 2020-02-19 MED ORDER — EPINEPHRINE 0.3 MG/0.3ML IJ SOAJ: 0.3000 mg | Freq: Once | INTRAMUSCULAR | Status: DC | PRN

## 2020-02-19 MED ORDER — SODIUM CHLORIDE 0.9 % IV SOLN
1200.0000 mg | Freq: Once | INTRAVENOUS | Status: AC
  Administered 2020-02-19: 1200 mg via INTRAVENOUS
  Filled 2020-02-19: qty 10

## 2020-02-19 NOTE — Progress Notes (Signed)
  Diagnosis: COVID-19  Physician: Dr. Joya Gaskins  Procedure: Covid Infusion Clinic Med: casirivimab\imdevimab infusion - Provided patient with casirivimab\imdevimab fact sheet for patients, parents and caregivers prior to infusion.  Complications: No immediate complications noted.  Discharge: Discharged home   Cheri Guppy 02/19/2020

## 2020-02-19 NOTE — Discharge Instructions (Signed)

## 2020-02-20 ENCOUNTER — Encounter: Payer: No Typology Code available for payment source | Admitting: Rehabilitative and Restorative Service Providers"

## 2020-02-20 NOTE — Telephone Encounter (Signed)
COVID infusion scheduled.

## 2020-02-21 ENCOUNTER — Other Ambulatory Visit: Payer: Self-pay | Admitting: Nurse Practitioner

## 2020-02-21 DIAGNOSIS — U071 COVID-19: Secondary | ICD-10-CM

## 2020-02-21 MED ORDER — ALBUTEROL SULFATE HFA 108 (90 BASE) MCG/ACT IN AERS: 1.0000 | INHALATION_SPRAY | RESPIRATORY_TRACT | 1 refills | Status: DC | PRN

## 2020-02-21 MED ORDER — ONDANSETRON HCL 8 MG PO TABS: 8.0000 mg | ORAL_TABLET | Freq: Three times a day (TID) | ORAL | 2 refills | Status: DC | PRN

## 2020-02-21 MED FILL — ALBUTEROL SULFATE HFA 108 (: 108 (90 BAS | 17 days supply | Qty: 9 | Fill #0

## 2020-02-21 MED FILL — ONDANSETRON HCL 8 MG TABLET: 8 | 10 days supply | Qty: 30 | Fill #0

## 2020-02-22 ENCOUNTER — Encounter: Payer: No Typology Code available for payment source | Admitting: Rehabilitative and Restorative Service Providers"

## 2020-02-26 ENCOUNTER — Encounter: Payer: No Typology Code available for payment source | Admitting: Rehabilitative and Restorative Service Providers"

## 2020-02-27 ENCOUNTER — Encounter: Payer: Self-pay | Admitting: Sports Medicine

## 2020-02-27 ENCOUNTER — Telehealth: Payer: Self-pay | Admitting: *Deleted

## 2020-02-27 NOTE — Telephone Encounter (Signed)
Letter written and available for download. °

## 2020-02-27 NOTE — Telephone Encounter (Signed)
[facility] notifying pt of letter.

## 2020-02-27 NOTE — Telephone Encounter (Signed)
Pt left vm stating that he needs a letter on letterhead stating that he is able to work full duty and that he will be required to go to physical therapy two days a week.

## 2020-02-29 ENCOUNTER — Encounter: Payer: Self-pay | Admitting: Rehabilitative and Restorative Service Providers"

## 2020-02-29 ENCOUNTER — Ambulatory Visit (INDEPENDENT_AMBULATORY_CARE_PROVIDER_SITE_OTHER): Payer: No Typology Code available for payment source | Admitting: Rehabilitative and Restorative Service Providers"

## 2020-02-29 ENCOUNTER — Encounter: Payer: Self-pay | Admitting: Sports Medicine

## 2020-02-29 ENCOUNTER — Telehealth: Payer: Self-pay | Admitting: Osteopathic Medicine

## 2020-02-29 ENCOUNTER — Other Ambulatory Visit: Payer: Self-pay

## 2020-02-29 DIAGNOSIS — M6281 Muscle weakness (generalized): Secondary | ICD-10-CM

## 2020-02-29 DIAGNOSIS — M5440 Lumbago with sciatica, unspecified side: Secondary | ICD-10-CM

## 2020-02-29 DIAGNOSIS — R29898 Other symptoms and signs involving the musculoskeletal system: Secondary | ICD-10-CM | POA: Diagnosis not present

## 2020-02-29 DIAGNOSIS — M5136 Other intervertebral disc degeneration, lumbar region: Secondary | ICD-10-CM

## 2020-02-29 NOTE — Telephone Encounter (Signed)
Letter written

## 2020-02-29 NOTE — Therapy (Signed)
[place], [name], [address] Phone: [phone]   Fax:  [phone]  Physical Therapy Treatment  Patient Details  Name: Joshua Brady MRN: [id number] Date of Birth: 01/06/1976 Referring Provider (PT): Dr Dianah Field    Encounter Date: 02/29/2020   PT End of Session - 02/29/20 1530    Visit Number 2    Number of Visits 12    Date for PT Re-Evaluation 03/26/20    PT Start Time 2585    PT Stop Time 1617    PT Time Calculation (min) 48 min    Activity Tolerance Patient tolerated treatment well           Past Medical History:  Diagnosis Date  . Borderline high cholesterol 01/25/2017   10-yr ASCVD 4.1%  . Dyslipidemia (high LDL; low HDL) 01/25/2017   10-yr ASCVD risk 4.1% (01/2017)  . Head injury 2016  . Heart murmur     Past Surgical History:  Procedure Laterality Date  . Broken Arm    . LIVER BIOPSY      There were no vitals filed for this visit.   Subjective Assessment - 02/29/20 1535    Subjective Patient reports that he and his family have all had COVID. He received an infusion and has had some decreased lung capacity. He has been cleared for exercises. 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    Currently in Pain? No/denies    Pain Score 0-No pain    Pain Location Back    Pain Orientation Lower;Left    Pain Type Acute pain              [facility] PT Assessment - 02/29/20 0001      Assessment   Medical Diagnosis Lumbar DDD     Referring Provider (PT) Dr Dianah Field     Onset Date/Surgical Date 01/25/20    Hand Dominance Right    Next MD Visit to schedule post PT x 6 wks     Prior Therapy yes for LBP ~ 4 yrs ago       AROM   Lumbar Flexion 70% pulling LB    Lumbar Extension 25% tightness LB    Lumbar - Right Side Bend 90%    Lumbar - Left Side Bend 95% pulling Rt LB     Lumbar - Right Rotation 30%    Lumbar - Left Rotation 305 discomfort Lt mid to low back        Strength   Overall Strength Comments weak core observed with transitional movements; neck and head flexion in supine       Flexibility   Hamstrings tight bilat ~ 40 deg Lt 45 deg Rt     Quadriceps tight bilat     ITB tight Lt > Rt     Piriformis tight Lt > Rt       Palpation   Spinal mobility hypomobile with CPA and Lt lateral spring testing L3/4/5     SI assessment  tightness Lt SI region     Palpation comment muscular tightness Lt lats; QL; lower lumbar paraspinals; Lt piriformis to gluts; Lt > Rt psoas       Transfers   Comments difficulty with transfers and transitional movments demonstrating poor body mechanics with movement                          [facility] Adult PT Treatment/Exercise - 02/29/20 0001  Lumbar Exercises: Stretches   Passive Hamstring Stretch Right;Left;2 reps;30 seconds   supine with strap    Standing Extension 2 reps   2-3 sec patient only achieving neutral lumbar curve    Prone on Elbows Stretch 1 rep;60 seconds    ITB Stretch Right;Left;2 reps;30 seconds   supine with strap    Piriformis Stretch Right;Left;2 reps;30 seconds   supine travell      Lumbar Exercises: Seated   Sit to Stand 10 reps   VC to cngage core      Lumbar Exercises: Supine   AB Set Limitations initated 2 part core - pelvic floor and transverse abdominals 10 sec hold x 10 reps     Bridge 10 reps;5 seconds      Moist Heat Therapy   Number Minutes Moist Heat 10 Minutes    Moist Heat Location Lumbar Spine      Electrical Stimulation   Electrical Stimulation Location Lt lumbar     Electrical Stimulation Action TENS    Electrical Stimulation Parameters to tolerance    Electrical Stimulation Goals Pain;Tone      Manual Therapy   Manual therapy comments skilled palpation to assess tissue response to DN and manual work     Soft tissue mobilization deep tissue work through the [employer] lumbar area     Myofascial Release Lt lumbar             Trigger Point Dry Needling -  02/29/20 0001    Consent Given? Yes    Education Handout Provided Yes    Dry Needling Comments Lt x 3 needles     Lumbar multifidi Response Palpable increased muscle length                PT Education - 02/29/20 1602    Education Details DN HEP    Person(s) Educated Patient    Methods Explanation;Demonstration;Tactile cues;Verbal cues;Handout    Comprehension Verbalized understanding;Returned demonstration;Verbal cues required;Tactile cues required               PT Long Term Goals - 02/13/20 1229      PT LONG TERM GOAL #1   Title Decrease LBP and LE radicular pain by 75-100% allowing patient to return to all normal functional activities    Time 6    Period Weeks    Status New    Target Date 03/26/20      PT LONG TERM GOAL #2   Title Increase mobility and ROM through lumbar spine to ~ 70-75% of range throughout with no pain    Time 6    Period Weeks    Status New    Target Date 03/26/20      PT LONG TERM GOAL #3   Title Improve core strength and stability progressing with strengthening program to prevent recurrent LBP    Time 6    Period Weeks    Status New    Target Date 03/26/20      PT LONG TERM GOAL #4   Title Independent in HEP    Time 6    Period Weeks    Status New    Target Date 03/26/20      PT LONG TERM GOAL #5   Title Improve FOTO to </= 36% limitation    Time 6    Period Weeks    Status New    Target Date 03/26/20  Plan - 02/29/20 1538    Clinical Impression Statement Imporvement in LBP and resolution of Lt LE pain in the past two weeks since pt was here. He has had the COVID virus and was in quarantine for 2 weeks. Fully resolved. Noted some increase in trunk mobilty/ROM with decreased pain and discomfort. Patient has continued muscular tightness through the Lt lumbar area.    Rehab Potential Good    PT Frequency 2x / week    PT Duration 6 weeks    PT Treatment/Interventions Patient/family education;ADLs/Self  Care Home Management;Aquatic Therapy;Cryotherapy;Electrical Stimulation;Iontophoresis 4mg /ml Dexamethasone;Moist Heat;Ultrasound;Gait training;Stair training;Functional mobility training;Therapeutic activities;Therapeutic exercise;Balance training;Neuromuscular re-education;Manual techniques;Dry needling;Taping    PT Next Visit Plan review HEP; add hip flexor, lat, calf stretches; progress with core stabiization and strengthening; trial of DN vs deep tissue work Lt LB to posterior hip (lats/QL/lumbar paraspinals/piriformis/glut med) modalities as indicated; possible trial of TENs for home use    PT Home Exercise Plan [email or web address]    Consulted and Agree with Plan of Care Patient           Patient will benefit from skilled therapeutic intervention in order to improve the following deficits and impairments:     Visit Diagnosis: DDD (degenerative disc disease), lumbar  Acute left-sided low back pain with sciatica, sciatica laterality unspecified  Muscle weakness (generalized)  Other symptoms and signs involving the musculoskeletal system     Problem List Patient Active Problem List   Diagnosis Date Noted  . Lumbar degenerative disc disease 02/06/2020  . Viral illness 09/11/2019  . Low testosterone 04/29/2018  . Sleep apnea 03/11/2018  . Daytime somnolence 03/11/2018  . 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 SOB (shortness of breath) on exertion 03/03/2018  . Palpitations 03/03/2018  . Borderline high cholesterol 01/25/2017  . Dyslipidemia (high LDL; low HDL) 01/25/2017  . History of brain concussion 01/21/2017  . History of low back pain 01/21/2017    Adellyn Capek Nilda Simmer PT, MPH  02/29/2020, 4:13 PM  [facility] [place], [name], [address] Phone: [phone]   Fax:  [phone]  Name: Joshua Brady MRN: [id number] Date of Birth: [birth year]

## 2020-02-29 NOTE — Telephone Encounter (Signed)
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 The letter also needs to say that he needs to do PT twice a week for possibly 6 weeks.

## 2020-02-29 NOTE — Patient Instructions (Signed)
Access Code: [email or web address]: [email or web address]: 09/16/2021Prepared by: Tishawna Larouche HoltExercises  Prone Press Up on Elbows - 2 x daily - 7 x weekly - 1 sets - 3 reps - 30-60 sec hold  Standing Lumbar Extension - 2 x daily - 7 x weekly - 1 sets - 2-3 reps - 2-3 sec hold  Hooklying Hamstring Stretch with Strap - 2 x daily - 7 x weekly - 1 sets - 3 reps - 30 sec hold  Supine Transversus Abdominis Bracing with Pelvic Floor Contraction - 2 x daily - 7 x weekly - 1 sets - 10 reps - 10sec hold  Bridge - 2 x daily - 7 x weekly - 1-2 sets - 10 reps - 10 sec hold  Sit to Stand - 2 x daily - 7 x weekly - 1 sets - 10 reps - 3-5 sec hold Trigger Point Dry Needling  . What is Trigger Point Dry Needling (DN)? o DN is a physical therapy technique used to treat muscle pain and dysfunction. Specifically, DN helps deactivate muscle trigger points (muscle knots).  o A thin filiform needle is used to penetrate the skin and stimulate the underlying trigger point. The goal is for a local twitch response (LTR) to occur and for the trigger point to relax. No medication of any kind is injected during the procedure.   . What Does Trigger Point Dry Needling Feel Like?  o The procedure feels different for each individual patient. Some patients report that they do not actually feel the needle enter the skin and overall the process is not painful. Very mild bleeding may occur. However, many patients feel a deep cramping in the muscle in which the needle was inserted. This is the local twitch response.   Marland Kitchen How Will I feel after the treatment? o Soreness is normal, and the onset of soreness may not occur for a few hours. Typically this soreness does not last longer than two days.  o Bruising is uncommon, however; ice can be used to decrease any possible bruising.  o In rare cases feeling tired or nauseous after the treatment is normal. In addition, your symptoms may get worse before they get better, this period will  typically not last longer than 24 hours.   . What Can I do After My Treatment? o Increase your hydration by drinking more water for the next 24 hours. o You may place ice or heat on the areas treated that have become sore, however, do not use heat on inflamed or bruised areas. Heat often brings more relief post needling. o You can continue your regular activities, but vigorous activity is not recommended initially after the treatment for 24 hours. o DN is best combined with other physical therapy such as strengthening, stretching, and other therapies.

## 2020-03-04 ENCOUNTER — Ambulatory Visit (INDEPENDENT_AMBULATORY_CARE_PROVIDER_SITE_OTHER): Payer: No Typology Code available for payment source | Admitting: Rehabilitative and Restorative Service Providers"

## 2020-03-04 ENCOUNTER — Encounter: Payer: Self-pay | Admitting: Rehabilitative and Restorative Service Providers"

## 2020-03-04 ENCOUNTER — Other Ambulatory Visit: Payer: Self-pay

## 2020-03-04 DIAGNOSIS — R29898 Other symptoms and signs involving the musculoskeletal system: Secondary | ICD-10-CM | POA: Diagnosis not present

## 2020-03-04 DIAGNOSIS — M5136 Other intervertebral disc degeneration, lumbar region: Secondary | ICD-10-CM | POA: Diagnosis not present

## 2020-03-04 DIAGNOSIS — M6281 Muscle weakness (generalized): Secondary | ICD-10-CM

## 2020-03-04 DIAGNOSIS — M5440 Lumbago with sciatica, unspecified side: Secondary | ICD-10-CM | POA: Diagnosis not present

## 2020-03-04 NOTE — Patient Instructions (Addendum)
°  Access Code: [email or web address]: [email or web address]: 09/20/2021Prepared by: Jan Walters HoltExercises  Prone Press Up on Elbows - 2 x daily - 7 x weekly - 1 sets - 3 reps - 30-60 sec hold  Standing Lumbar Extension - 2 x daily - 7 x weekly - 1 sets - 2-3 reps - 2-3 sec hold  Hooklying Hamstring Stretch with Strap - 2 x daily - 7 x weekly - 1 sets - 3 reps - 30 sec hold  Supine Transversus Abdominis Bracing with Pelvic Floor Contraction - 2 x daily - 7 x weekly - 1 sets - 10 reps - 10sec hold  Bridge - 2 x daily - 7 x weekly - 1-2 sets - 10 reps - 10 sec hold  Sit to Stand - 2 x daily - 7 x weekly - 1 sets - 10 reps - 3-5 sec hold  Gastroc Stretch on Wall - 2 x daily - 7 x weekly - 1 sets - 3 reps - 30 sec hold  Wall Quarter Squat - 2 x daily - 7 x weekly - 1-2 sets - 10 reps - 5-10 sec hold  Standing Plank on Wall - 2 x daily - 7 x weekly - 1 sets - 3-5 reps - 30-60 sec hold  Dead Bug - 2 x daily - 7 x weekly - 1-2 sets - 10 reps - 2 sec hold

## 2020-03-04 NOTE — Therapy (Signed)
[place], [name], [address] Phone: [phone]   Fax:  [phone]  Physical Therapy Treatment  Patient Details  Name: Joshua Brady MRN: [id number] Date of Birth: 1975/10/27 Referring Provider (PT): Dr Dianah Field    Encounter Date: 03/04/2020   PT End of Session - 03/04/20 0931    Visit Number 3    Number of Visits 12    Date for PT Re-Evaluation 03/26/20    PT Start Time 0930    PT Stop Time 1018    PT Time Calculation (min) 48 min    Activity Tolerance Patient tolerated treatment well           Past Medical History:  Diagnosis Date  . Borderline high cholesterol 01/25/2017   10-yr ASCVD 4.1%  . Dyslipidemia (high LDL; low HDL) 01/25/2017   10-yr ASCVD risk 4.1% (01/2017)  . Head injury 2016  . Heart murmur     Past Surgical History:  Procedure Laterality Date  . Broken Arm    . LIVER BIOPSY      There were no vitals filed for this visit.   Subjective Assessment - 03/04/20 0931    Subjective Did not like the DN - made him sore for 3 days and did not seem to help. He is working on some of the exercises at home.    Currently in Pain? No/denies    Pain Score 0-No pain                             [facility] Adult PT Treatment/Exercise - 03/04/20 0001      Lumbar Exercises: Stretches   Passive Hamstring Stretch Right;Left;2 reps;30 seconds   seated hip hinge   Hip Flexor Stretch Right;Left;2 reps;30 seconds   seated    Standing Extension 2 reps   2-3 sec patient only achieving neutral lumbar curve    Gastroc Stretch Right;Left;2 reps;30 seconds   at wall      Lumbar Exercises: Aerobic   Nustep L5 x 6 min UE 10       Lumbar Exercises: Standing   Wall Slides 10 reps;5 seconds      Lumbar Exercises: Seated   Sit to Stand 10 reps   VC to cngage core and move eccentrically slowly      Lumbar Exercises: Supine   AB Set Limitations 2 part core - pelvic floor and  transverse abdominals 10 sec hold x 10 reps     Dead Bug 10 reps    Bridge 10 reps;5 seconds    Other Supine Lumbar Exercises hip abduction w/ blue TB alternating LE's x 10 each LE core engaged       Lumbar Exercises: Sidelying   Clam Right;Left;10 reps;3 seconds    Clam Limitations 3 sec hold       Moist Heat Therapy   Number Minutes Moist Heat 10 Minutes    Moist Heat Location Lumbar Spine      Electrical Stimulation   Electrical Stimulation Location Lt lumbar     Electrical Stimulation Action TENS    Electrical Stimulation Parameters to tolerance    Electrical Stimulation Goals Pain;Tone                  PT Education - 03/04/20 0954    Education Details HEP    Person(s) Educated Patient    Methods Explanation;Demonstration;Tactile cues;Verbal cues;Handout    Comprehension Verbalized understanding;Returned demonstration;Verbal  cues required;Tactile cues required               PT Long Term Goals - 02/13/20 1229      PT LONG TERM GOAL #1   Title Decrease LBP and LE radicular pain by 75-100% allowing patient to return to all normal functional activities    Time 6    Period Weeks    Status New    Target Date 03/26/20      PT LONG TERM GOAL #2   Title Increase mobility and ROM through lumbar spine to ~ 70-75% of range throughout with no pain    Time 6    Period Weeks    Status New    Target Date 03/26/20      PT LONG TERM GOAL #3   Title Improve core strength and stability progressing with strengthening program to prevent recurrent LBP    Time 6    Period Weeks    Status New    Target Date 03/26/20      PT LONG TERM GOAL #4   Title Independent in HEP    Time 6    Period Weeks    Status New    Target Date 03/26/20      PT LONG TERM GOAL #5   Title Improve FOTO to </= 36% limitation    Time 6    Period Weeks    Status New    Target Date 03/26/20                 Plan - 03/04/20 0935    Clinical Impression Statement Patient reports  increased soreness and discomfort with DN and that it did not help. Has been doing some of the exercises. Added core strengthening exercises in standing and supine today.    Rehab Potential Good    PT Frequency 2x / week    PT Duration 6 weeks    PT Treatment/Interventions Patient/family education;ADLs/Self Care Home Management;Aquatic Therapy;Cryotherapy;Electrical Stimulation;Iontophoresis 4mg /ml Dexamethasone;Moist Heat;Ultrasound;Gait training;Stair training;Functional mobility training;Therapeutic activities;Therapeutic exercise;Balance training;Neuromuscular re-education;Manual techniques;Dry needling;Taping    PT Next Visit Plan review HEP; progress with core stabiization and strengthening; tight(lats/QL/lumbar paraspinals/piriformis/glut med) modalities as indicated    PT Home Exercise Plan [email or web address]    Consulted and Agree with Plan of Care Patient           Patient will benefit from skilled therapeutic intervention in order to improve the following deficits and impairments:     Visit Diagnosis: DDD (degenerative disc disease), lumbar  Acute left-sided low back pain with sciatica, sciatica laterality unspecified  Muscle weakness (generalized)  Other symptoms and signs involving the musculoskeletal system     Problem List Patient Active Problem List   Diagnosis Date Noted  . Lumbar degenerative disc disease 02/06/2020  . Viral illness 09/11/2019  . Low testosterone 04/29/2018  . Sleep apnea 03/11/2018  . Daytime somnolence 03/11/2018  . 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 SOB (shortness of breath) on exertion 03/03/2018  . Palpitations 03/03/2018  . Borderline high cholesterol 01/25/2017  . Dyslipidemia (high LDL; low HDL) 01/25/2017  . History of brain concussion 01/21/2017  . History of low back pain 01/21/2017    Slevin Gunby Nilda Simmer PT, MPH  03/04/2020, 10:13 AM  [facility] [place],  [name], [address] Phone: [phone]   Fax:  [phone]  Name: Joshua Brady MRN: [id number] Date of Birth: 1976/05/12

## 2020-03-06 ENCOUNTER — Encounter: Payer: Self-pay | Admitting: Rehabilitative and Restorative Service Providers"

## 2020-03-06 ENCOUNTER — Other Ambulatory Visit: Payer: Self-pay

## 2020-03-06 ENCOUNTER — Ambulatory Visit (INDEPENDENT_AMBULATORY_CARE_PROVIDER_SITE_OTHER): Payer: No Typology Code available for payment source | Admitting: Rehabilitative and Restorative Service Providers"

## 2020-03-06 DIAGNOSIS — M6281 Muscle weakness (generalized): Secondary | ICD-10-CM | POA: Diagnosis not present

## 2020-03-06 DIAGNOSIS — M5440 Lumbago with sciatica, unspecified side: Secondary | ICD-10-CM

## 2020-03-06 DIAGNOSIS — M5136 Other intervertebral disc degeneration, lumbar region: Secondary | ICD-10-CM

## 2020-03-06 DIAGNOSIS — R29898 Other symptoms and signs involving the musculoskeletal system: Secondary | ICD-10-CM

## 2020-03-06 NOTE — Therapy (Addendum)
[place], [name], [address] Phone: [phone]   Fax:  [phone]  Physical Therapy Treatment and Discharge  Patient Details  Name: Joshua Brady MRN: [id number] Date of Birth: [birth year] Referring Provider (PT): Dr Dianah Field    Encounter Date: 03/06/2020   PT End of Session - 03/06/20 1609    Visit Number 4    Number of Visits 12    Date for PT Re-Evaluation 03/26/20    PT Start Time 1608    PT Stop Time 1646    PT Time Calculation (min) 38 min    Activity Tolerance Patient tolerated treatment well           Past Medical History:  Diagnosis Date  . Borderline high cholesterol 01/25/2017   10-yr ASCVD 4.1%  . Dyslipidemia (high LDL; low HDL) 01/25/2017   10-yr ASCVD risk 4.1% (01/2017)  . Head injury 2016  . Heart murmur     Past Surgical History:  Procedure Laterality Date  . Broken Arm    . LIVER BIOPSY      There were no vitals filed for this visit.   Subjective Assessment - 03/06/20 1610    Subjective Sore iin the LB for the past couple of days. Back to work today but sat in the [facility] all day today.    Currently in Pain? No/denies    Pain Score 0-No pain                             [facility] Adult PT Treatment/Exercise - 03/06/20 0001      Lumbar Exercises: Aerobic   Nustep L5 x 5 min UE 10       Lumbar Exercises: Standing   Wall Slides 10 reps   10 sec hold    Other Standing Lumbar Exercises wall plank 60 sec x 3 reps       Lumbar Exercises: Seated   Sit to Stand 10 reps   VC to cngage core and move eccentrically slowly    Other Seated Lumbar Exercises segmental mobility cat cow x 5 reps       Lumbar Exercises: Supine   Dead Bug 10 reps   VC to engage core    Bridge 10 reps;5 seconds    Bridge with clamshell 10 reps;3 seconds   black TB above knees    Other Supine Lumbar Exercises hip abduction w/ black TB alternating LE's x 10 each LE  core engaged       Lumbar Exercises: Sidelying   Clam Right;Left;10 reps;3 seconds   black TB above knees    Clam Limitations 3 sec hold       Lumbar Exercises: Quadruped   Madcat/Old Horse 10 reps   2-3 sec hold    Other Quadruped Lumbar Exercises sit back to child's pose 10 sec x 3 reps       Modalities   Modalities --   declined - using TENS at home                       PT Long Term Goals - 02/13/20 1229      PT LONG TERM GOAL #1   Title Decrease LBP and LE radicular pain by 75-100% allowing patient to return to all normal functional activities    Time 6    Period Weeks    Status New    Target Date  03/26/20      PT LONG TERM GOAL #2   Title Increase mobility and ROM through lumbar spine to ~ 70-75% of range throughout with no pain    Time 6    Period Weeks    Status New    Target Date 03/26/20      PT LONG TERM GOAL #3   Title Improve core strength and stability progressing with strengthening program to prevent recurrent LBP    Time 6    Period Weeks    Status New    Target Date 03/26/20      PT LONG TERM GOAL #4   Title Independent in HEP    Time 6    Period Weeks    Status New    Target Date 03/26/20      PT LONG TERM GOAL #5   Title Improve FOTO to </= 36% limitation    Time 6    Period Weeks    Status New    Target Date 03/26/20                 Plan - 03/06/20 1630    Clinical Impression Statement Continued soreness in the LB and some in the LE's in the past couple of days. Back to work today and sitting for much of the day. Continued progression of core stabilization.    Rehab Potential Good    PT Frequency 2x / week    PT Duration 6 weeks    PT Treatment/Interventions Patient/family education;ADLs/Self Care Home Management;Aquatic Therapy;Cryotherapy;Electrical Stimulation;Iontophoresis 85m/ml Dexamethasone;Moist Heat;Ultrasound;Gait training;Stair training;Functional mobility training;Therapeutic activities;Therapeutic  exercise;Balance training;Neuromuscular re-education;Manual techniques;Dry needling;Taping    PT Next Visit Plan review HEP; progress with core stabiization and strengthening; tight(lats/QL/lumbar paraspinals/piriformis/glut med) modalities as indicated    PT Home Exercise Plan [email or web address]    Consulted and Agree with Plan of Care Patient           Patient will benefit from skilled therapeutic intervention in order to improve the following deficits and impairments:     Visit Diagnosis: DDD (degenerative disc disease), lumbar  Acute left-sided low back pain with sciatica, sciatica laterality unspecified  Muscle weakness (generalized)  Other symptoms and signs involving the musculoskeletal system     Problem List Patient Active Problem List   Diagnosis Date Noted  . Lumbar degenerative disc disease 02/06/2020  . Viral illness 09/11/2019  . Low testosterone 04/29/2018  . Sleep apnea 03/11/2018  . Daytime somnolence 03/11/2018  . 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 SOB (shortness of breath) on exertion 03/03/2018  . Palpitations 03/03/2018  . Borderline high cholesterol 01/25/2017  . Dyslipidemia (high LDL; low HDL) 01/25/2017  . History of brain concussion 01/21/2017  . History of low back pain 01/21/2017   PHYSICAL THERAPY DISCHARGE SUMMARY  Visits from Start of Care: 4  Current functional level related to goals / functional outcomes: See above   Remaining deficits: See above   Education / Equipment: HEP Plan: Patient agrees to discharge.  Patient goals were not met. Patient is being discharged due to not returning since the last visit.  ?????      Shirley Decamp PNilda SimmerPT, MPH  03/06/2020, 4:47 PM  [facility] [address]Phone: [phone]  Fax:  [phone] Name: Joshua BaughMRN: [id number] of Birth: 304-08-1975

## 2020-03-13 ENCOUNTER — Encounter: Payer: No Typology Code available for payment source | Admitting: Rehabilitative and Restorative Service Providers"

## 2020-06-21 ENCOUNTER — Encounter: Payer: Self-pay | Admitting: Nurse Practitioner

## 2020-06-21 ENCOUNTER — Other Ambulatory Visit: Payer: Self-pay | Admitting: Nurse Practitioner

## 2020-06-21 ENCOUNTER — Telehealth (INDEPENDENT_AMBULATORY_CARE_PROVIDER_SITE_OTHER): Payer: No Typology Code available for payment source | Admitting: Nurse Practitioner

## 2020-06-21 DIAGNOSIS — J029 Acute pharyngitis, unspecified: Secondary | ICD-10-CM

## 2020-06-21 DIAGNOSIS — R590 Localized enlarged lymph nodes: Secondary | ICD-10-CM | POA: Diagnosis not present

## 2020-06-21 DIAGNOSIS — R5383 Other fatigue: Secondary | ICD-10-CM | POA: Diagnosis not present

## 2020-06-21 DIAGNOSIS — M791 Myalgia, unspecified site: Secondary | ICD-10-CM | POA: Diagnosis not present

## 2020-06-21 MED ORDER — LIDOCAINE VISCOUS HCL 2 % MT SOLN: 15.0000 mL | OROMUCOSAL | 1 refills | Status: DC | PRN

## 2020-06-21 MED ORDER — CEFDINIR 300 MG PO CAPS: 300.0000 mg | ORAL_CAPSULE | Freq: Two times a day (BID) | ORAL | 0 refills | Status: DC

## 2020-06-21 NOTE — Progress Notes (Signed)
Spoke with patient on the phone-  WBC counts are pretty elevated with a jump in your neutrophils. This tells me there is definitely an infection going on inside your body. Rather than wait to get the results of the CMV and Mono tests, I have sent a prescription for cefdinir for coverage of bacterial infection of the throat. In the event the viral tests are negative, I do not want to let the bacteria continue to grow and cause problems. I will let you know when we get the results of the other two tests and we can make any changes if needed.

## 2020-06-21 NOTE — Progress Notes (Signed)
Virtual Video Visit via MyChart Note- converted to telephone due to connection difficulties.   I connected with  [name] on 06/21/20 at  9:50 AM EST by the video enabled telemedicine application for , MyChart, and verified that I am speaking with the correct person using two identifiers.   I introduced myself as a Designer, jewellery with the practice. We discussed the limitations of evaluation and management by telemedicine and the availability of in person appointments. The patient expressed understanding and agreed to proceed.  Participating parties in this visit include: The patient and the nurse practitioner listed. The patient is: At home I am: In the office  Subjective:    CC: congestion, something stuck in throat  HPI: Joshua Brady is a 45 y.o. year old male presenting today via [place] today for symptoms of congestion, sore throat, and a sensation of globus that started on Monday afternoon. He reports that he was unable to eat due to the pain and he has been incredibly fatigued. He has been able to drink. He reports that he has been waking up feeling like he is choking. He has also noted cervical lymphadenopathy, primarily in the submandibular area with tenderness on palpation. He has also noticed a large amount drool. He has been unusually cold and shivering and then will start sweating profusely- no known fever, but he reports it has felt like shock-like symptoms, but vitals are stable.   He went to the ED yesterday and was tested for flu, COVID, and strep, all of which were negative. He said he could feel swelling in his throat and was given decadron in the ED yesterday, which helped with the swelling, but he is still experiencing the throat pain and fatigue. He reports he was told in the ED that there was not significant erythema in the throat or signs of peritonsillar abscess present. He has not had any new sexual partners and has not been exposed to anyone with illness that he is  aware of. 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   He has been using liquid mucinex, tylenol, and nasal spray to help with symptoms which have been minimally helpful. He will be picking up a prescription for viscous lidocaine from the pharmacy today.   He is able to breath ok, he has no wheezing or stridor. He denies abdominal pain or splenic area tenderness, nausea, vomiting, or diarrhea.   He has allergies to peanuts, eggs, and salmon, but has not been exposed to these and is not having typical allergic reaction symptoms.   Past medical history, Surgical history, Family history not pertinant except as noted below, Social history, Allergies, and medications have been entered into the medical record, reviewed, and corrections made.   Review of Systems:  All review of systems negative except what is listed in the HPI   Objective:    General:  Speaking clearly in complete sentences. Absent shortness of breath noted.   Alert and oriented x3.   Normal judgment.  Absent acute distress. Audible congestion is present.   Impression and Recommendations:    1. Sore throat 2. Fatigue, unspecified type 3. Myalgia 4. Lymphadenopathy, anterior cervical - Mononucleosis screen - CMV abs, IgG+IgM (cytomegalovirus) - CBC with Differential - COMPLETE METABOLIC PANEL WITH GFR  Symptoms and presentation consistent with possible viral infection of EBV. I do think it would be beneficial to evaluate for CMV and Mononucleosis with blood tests. Will also evaluate for signs of  white blood count dysfunction and elevated liver enzymes for monitoring. Given his frequent exposure to college aged individuals, it is very possible that he has been exposed through work.  His recent tests for COVID, flu, and strep were all negative and his in person evaluation in the emergency room yesterday did not show any evidence of airway restriction or peritonsillar  abscess per the patient and ED notes. Patient will come by the office for testing today- lab orders sent.  Will make changes to the plan of care based on lab results. Consider treatment with prednisone burst for symptom management if EBV/CMV positive. May also consider antibiotic therapy if signs of bacterial infection present on CBC as a peritonsillar abscess could be present but not well visualized.    Follow-up if symptoms worsen or fail to improve.    I discussed the assessment and treatment plan with the patient. The patient was provided an opportunity to ask questions and all were answered. The patient agreed with the plan and demonstrated an understanding of the instructions.   The patient was advised to call back or seek an in-person evaluation if the symptoms worsen or if the condition fails to improve as anticipated.  I provided 30 minutes of non-face-to-face interaction with this [place] visit including intake, same-day documentation, and chart review. - converted to telephone due to connection difficulties.   [name], NP

## 2020-06-21 NOTE — Patient Instructions (Signed)
Infectious Mononucleosis Infectious mononucleosis is an infection that is caused by a virus. This illness is often called "mono." It can spread from person to person (is contagious). Mono is usually not serious. It often goes away in 2-4 weeks without treatment. In rare cases, the illness can be bad and last longer. What are the causes? This condition is caused by the Epstein-Barr virus. This virus spreads through:  Contact with a sick person's saliva or other body fluids. This can happen through: ? Kissing. ? Having sex. ? Coughing. ? Sneezing.  Sharing forks, spoons, knives (utensils), or drinking glasses with a person who is sick.  Receiving blood from a person who has mono (blood transfusion).  Receiving an organ from a person who has mono (organ transplant). What increases the risk? You are more likely to develop this condition if:  You are 10-76 years old. What are the signs or symptoms? You may get symptoms 4-6 weeks after infection. Symptoms may start slowly and happen at different times. Common symptoms include:  Sore throat.  Headache.  Being very tired (fatigued).  Pain in the muscles.  Swollen glands.  Fever.  No desire for food.  Rash. Other symptoms include:  A liver or spleen that is larger than normal.  Feeling sick to your stomach (nauseous).  Throwing up (vomiting).  Pain in the belly (abdomen). How is this treated? There is no cure for this condition. Mono usually goes away on its own with time. Treatment can help relieve symptoms and may include:  Taking medicines to treat pain and fever.  Drinking plenty of fluids.  Getting a lot of rest.  Taking medicines to treat swelling (corticosteroids). In some very bad cases, treatment may have to be given in a hospital. Follow these instructions at home: Medicines  Take over-the-counter and prescription medicines only as told by your doctor.  Do not take ampicillin or amoxicillin. This  may cause a rash.  Do not take aspirin if you are under 18. Activity  Rest as needed.  Do not do any of the following activities until your doctor says that they are safe for you: ? Contact sports. You may need to wait at least 1 month before you play sports. ? Exercise that uses a lot of energy. ? Lifting heavy things.  Slowly go back to your normal activities after your fever is gone, or when your doctor says that you can. Be sure to rest when you get tired. General instructions   Avoid kissing or sharing forks, spoons, knives, or drinking cups until your doctor says that you can.  Drink enough fluid to keep your pee (urine) pale yellow.  Do not drink alcohol.  If you have a sore throat: ? Rinse your mouth (gargle) with salt water 3-4 times a day or as needed. To make salt water, dissolve -1 tsp (3-6 g) of salt in 1 cup (237 mL) of warm water. ? Eat soft foods. Cold foods such as ice cream or ice pops can help your throat feel better. ? Try sucking on hard candy.  Wash your hands often with soap and water. If you cannot use soap and water, use hand sanitizer.  Keep all follow-up visits as told by your doctor. This is important. How is this prevented?   Avoid contact with people who have mono. A person who has mono may not seem sick, but he or she can still spread the virus.  Avoid sharing forks, spoons, knives, drinking cups, or  toothbrushes.  Wash your hands often with soap and water. If you cannot use soap and water, use hand sanitizer.  Use the inside of your elbow to cover your mouth when you cough or sneeze. Contact a doctor if:  Your fever is not gone after 10 days.  You have swelling by your jaw or neck (swollen lymph nodes), and the swelling does not go away after 4 weeks.  Your activity level is not back to normal after 2 months.  Your skin or the white parts of your eyes turn yellow (jaundice).  You have trouble pooping (constipation). This may mean that  you: ? Poop (have a bowel movement) fewer times in a week than normal. ? Have a hard time pooping. ? Have poop that is dry, hard, or bigger than normal. Get help right away if:  You have very bad pain in your: ? Belly. ? Shoulder.  You are drooling.  You have trouble swallowing.  You have trouble breathing.  You have a stiff neck.  You have a very bad headache.  You cannot stop throwing up.  You have jerky movements that you cannot control (seizures).  You are mixed up (confused).  You have trouble with balance.  Your nose or gums start to bleed.  You have signs of not having enough water in your body (dehydration). These may include: ? Weakness. ? Sunken eyes. ? Pale skin. ? Dry mouth. ? Fast breathing or heartbeat. Summary  Infectious mononucleosis, or "mono," is an infection that is caused by a virus.  Mono is usually not serious, but some people may need to be treated for it in the hospital.  You should not play contact sports or lift heavy things until your doctor says that you can.  Wash your hands often with soap and water. If you cannot use soap and water, use hand sanitizer. This information is not intended to replace advice given to you by your health care provider. Make sure you discuss any questions you have with your health care provider. Document Revised: 03/16/2018 Document Reviewed: 03/16/2018 Elsevier Patient Education  2020 [employer]   Cytomegalovirus Infection, Adult Cytomegalovirus (CMV) is a common virus. Usually, a CMV infection does not cause problems in adults with a normal body defense system (immune system). However, in some people with a weak immune system, the infection can lead to serious conditions, such as blindness or swelling in the brain. If the virus spreads to a baby during pregnancy, it can cause pregnancy loss or growth problems or developmental disabilities in the baby. A CMV infection is more likely to be serious  in:  People who have a weak immune system, such as from: ? HIV (human immunodeficiency virus). ? Cancer treatment. ? Treatment related to an organ or stem cell transplant.  Babies of pregnant women infected with the virus. A CMV infection cannot be prevented with a vaccine. What are the causes? This condition is caused by CMV, which may also be called human herpesvirus 5. This virus can spread through body fluids such as blood, urine, breast milk, saliva, and semen. You can get CMV by coming into contact with the body fluids of someone who is infected. This can happen if:  You get body fluids from an infected person on your hands and then rub your eyes or touch the inside of your nose or mouth.  You have unprotected sex with an infected person.  You kiss an infected person on the mouth.  You receive infected  blood.  You have an organ transplant or bone marrow transplant from an infected donor. What are the signs or symptoms? Symptoms of this condition include:  Tiredness (fatigue).  Weakness.  Fever that lasts for several days.  Achy muscles.  Sore throat.  Sore or enlarged lymph nodes.  Weight loss.  Headache. Symptoms may be more severe in people who have a weak immune system. In those people, the virus may affect one part of the body. For example, it may affect:  The stomach or intestines. This can lead to an infection called gastroenteritis.  The liver. This can lead to a liver disease called hepatitis.  The lungs. This can lead to inflammation of the lungs (pneumonia).  The eyes. This can make it hard to see and can lead to blindness.  The brain (rare). This can lead to seizures or a coma. Most people who have this condition never have symptoms. How is this diagnosed? This condition is diagnosed based on:  Symptoms.  A physical exam.  Blood and urine tests. How is this treated? This condition may be managed with:  Medicines to relieve symptoms,  prevent complications, and keep the virus from spreading.  Antiviral medicines to treat symptoms if you are at higher risk of having a severe infection. No treatment will completely remove the virus from your body. If you get the virus, you will have it for the rest of your life. Follow these instructions at home:   Take over-the-counter and prescription medicines only as told by your health care provider.  Drink enough fluid to keep your urine pale yellow.  Take actions to keep the virus from spreading to others. For example: ? Wash your hands often. ? Throw away used tissues. ? Avoid sharing eating and drinking utensils.  Keep all follow-up visits as told by your health care provider. This is important. Contact a health care provider if:  You develop any symptoms.  You have a fever.  You are extremely tired.  You have muscle aches and a headache.  Your muscles are unusually weak.  You become confused.  You are very irritable.  Your vision gets blurry or you have trouble seeing.  You get pregnant or you wish to get pregnant. Summary  Cytomegalovirus (CMV) is a common virus.  A CMV infection does not usually cause problems in adults with a normal body defense system (immune system). However, in some people with a weak immune system, the infection can lead to serious conditions, such as blindness or swelling in the brain.  Most people with CMV never have symptoms. If it does cause symptoms, they are similar to a flu-like illness.  No treatment will completely remove the virus from your body. If you get the virus, you will have it for the rest of your life. However, medicines can help relieve symptoms, prevent complications, and keep the virus from spreading. This information is not intended to replace advice given to you by your health care provider. Make sure you discuss any questions you have with your health care provider. Document Revised: 06/03/2017 Document  Reviewed: 06/03/2017 Elsevier Patient Education  2020 [employer].

## 2020-06-25 ENCOUNTER — Telehealth: Payer: Self-pay

## 2020-06-25 LAB — COMPLETE METABOLIC PANEL WITH GFR
AG Ratio: 1.4 (calc) (ref 1.0–2.5)
ALT: 53 U/L — ABNORMAL HIGH (ref 9–46)
AST: 28 U/L (ref 10–40)
Albumin: 4.5 g/dL (ref 3.6–5.1)
Alkaline phosphatase (APISO): 88 U/L (ref 36–130)
BUN: 19 mg/dL (ref 7–25)
CO2: 26 mmol/L (ref 20–32)
Calcium: 10.2 mg/dL (ref 8.6–10.3)
Chloride: 102 mmol/L (ref 98–110)
Creat: 0.95 mg/dL (ref 0.60–1.35)
GFR, Est African American: 111 mL/min/{1.73_m2} (ref >=60)
GFR, Est Non African American: 96 mL/min/{1.73_m2} (ref >=60)
Globulin: 3.2 g/dL (calc) (ref 1.9–3.7)
Glucose, Bld: 129 mg/dL (ref 65–139)
Potassium: 4.4 mmol/L (ref 3.5–5.3)
Sodium: 139 mmol/L (ref 135–146)
Total Bilirubin: 0.6 mg/dL (ref 0.2–1.2)
Total Protein: 7.7 g/dL (ref 6.1–8.1)

## 2020-06-25 LAB — CBC WITH DIFFERENTIAL/PLATELET
Absolute Monocytes: 1386 cells/uL — ABNORMAL HIGH (ref 200–950)
Basophils Absolute: 16 cells/uL (ref 0–200)
Basophils Relative: 0.1 %
Eosinophils Absolute: 0 cells/uL — ABNORMAL LOW (ref 15–500)
Eosinophils Relative: 0 %
HCT: 45.2 % (ref 38.5–50.0)
Hemoglobin: 15.7 g/dL (ref 13.2–17.1)
Lymphs Abs: 2575 cells/uL (ref 850–3900)
MCH: 29.7 pg (ref 27.0–33.0)
MCHC: 34.7 g/dL (ref 32.0–36.0)
MCV: 85.4 fL (ref 80.0–100.0)
MPV: 11.5 fL (ref 7.5–12.5)
Monocytes Relative: 8.5 %
Neutro Abs: 12323 cells/uL — ABNORMAL HIGH (ref 1500–7800)
Neutrophils Relative %: 75.6 %
Platelets: 323 10*3/uL (ref 140–400)
RBC: 5.29 10*6/uL (ref 4.20–5.80)
RDW: 12.5 % (ref 11.0–15.0)
Total Lymphocyte: 15.8 %
WBC: 16.3 10*3/uL — ABNORMAL HIGH (ref 3.8–10.8)

## 2020-06-25 LAB — CMV ABS, IGG+IGM (CYTOMEGALOVIRUS)
CMV IgM: <30 AU/mL
Cytomegalovirus Ab-IgG: >10 U/mL — ABNORMAL HIGH

## 2020-06-25 LAB — MONONUCLEOSIS SCREEN: Heterophile, Mono Screen: NEGATIVE

## 2020-06-25 NOTE — Telephone Encounter (Signed)
Pt states he is feeling better but still has a ST and tenderness. Would like to know what the next steps are for this.   Pt viewed lab results and saw elevated ALT. Pt is wanting to know more info about this.   He is wanting to know if he is cleared to return to work now. States the atbx messed with his head and caused some dizziness. He has not finished the atbx yet. He will need a note to return to work but if he is going to be out of work longer, he will need a note that as well.

## 2020-06-26 NOTE — Telephone Encounter (Signed)
Work note written and left up front for patient.

## 2020-06-26 NOTE — Progress Notes (Signed)
Please call patient: How are you feeling since starting the antibiotic? Your mono results came back negative, but the CMV (cytomegalovirus) results indicate that you have had this infection, although it appears that it is improved.  As long as you are not running a fever you can go back to work, but please rest as much as possible. You should expect to fully recover in 4-6 weeks. I would avoid eating and drinking after others for at least 2 more weeks to prevent infection spread.   If patient is feeling better- ok to provide work note to return to work.

## 2020-06-26 NOTE — Telephone Encounter (Signed)
Please let him know that sore throat and lymph enlargement can last for several weeks. His labs did show that his CMV was positive, but it looks like it has been active for a couple of weeks at this point. Symptoms can last 4-6 weeks in otherwise healthy people.   ALT elevation is common in cytomagalovirus. It was not significantly elevated and we can plan to recheck in about 3 months to make sure that it looks ok. No concerns right now.   He can return to work as soon as he feels like he can go back and he is not running a fever. He needs to be sure that he is not eating or drinking after others, as this can spread the virus for a few weeks.   OK to write note for him to return to work, if he feels well enough.   Continue Tylenol and Ibuprofen alternating for pain every 4 hours as needed.   If new symptoms present, let us know.

## 2020-06-26 NOTE — Telephone Encounter (Signed)
Pt aware of information below.  He is requesting a work note to return on 07/03/2020. This is following his normal work schedule of working Wednesday-Sunday. His first day of missing work was 06/18/2020. Pt states he will have to come by and pick up the letter and will do so next week prior to returning to work.

## 2020-07-15 ENCOUNTER — Telehealth: Payer: Self-pay | Admitting: Nurse Practitioner

## 2020-07-15 DIAGNOSIS — B259 Cytomegaloviral disease, unspecified: Secondary | ICD-10-CM

## 2020-07-15 NOTE — Telephone Encounter (Signed)
FMLA paperwork completed for patient due to CMV infection.

## 2020-09-10 ENCOUNTER — Other Ambulatory Visit: Payer: Self-pay

## 2020-09-10 ENCOUNTER — Encounter (HOSPITAL_COMMUNITY): Payer: Self-pay

## 2020-09-10 ENCOUNTER — Emergency Department (HOSPITAL_COMMUNITY)
Admission: EM | Admit: 2020-09-10 | Discharge: 2020-09-11 | Disposition: A | Discharge status: Home / Self Care | Payer: No Typology Code available for payment source | Attending: Emergency Medicine | Admitting: Emergency Medicine

## 2020-09-10 ENCOUNTER — Emergency Department (HOSPITAL_COMMUNITY): Payer: No Typology Code available for payment source

## 2020-09-10 DIAGNOSIS — Z20822 Contact with and (suspected) exposure to covid-19: Secondary | ICD-10-CM | POA: Insufficient documentation

## 2020-09-10 DIAGNOSIS — R42 Dizziness and giddiness: Secondary | ICD-10-CM | POA: Insufficient documentation

## 2020-09-10 DIAGNOSIS — R1013 Epigastric pain: Secondary | ICD-10-CM | POA: Diagnosis not present

## 2020-09-10 DIAGNOSIS — R5383 Other fatigue: Secondary | ICD-10-CM | POA: Diagnosis not present

## 2020-09-10 DIAGNOSIS — R0789 Other chest pain: Secondary | ICD-10-CM | POA: Diagnosis present

## 2020-09-10 DIAGNOSIS — R079 Chest pain, unspecified: Secondary | ICD-10-CM

## 2020-09-10 DIAGNOSIS — R0602 Shortness of breath: Secondary | ICD-10-CM | POA: Insufficient documentation

## 2020-09-10 DIAGNOSIS — M545 Low back pain, unspecified: Secondary | ICD-10-CM | POA: Insufficient documentation

## 2020-09-10 LAB — CBC
HCT: 43.8 % (ref 39.0–52.0)
Hemoglobin: 14.7 g/dL (ref 13.0–17.0)
MCH: 29.4 pg (ref 26.0–34.0)
MCHC: 33.6 g/dL (ref 30.0–36.0)
MCV: 87.6 fL (ref 80.0–100.0)
Platelets: 321 10*3/uL (ref 150–400)
RBC: 5 MIL/uL (ref 4.22–5.81)
RDW: 13.3 % (ref 11.5–15.5)
WBC: 9.7 10*3/uL (ref 4.0–10.5)
nRBC: 0 % (ref 0.0–0.2)

## 2020-09-10 LAB — BASIC METABOLIC PANEL
Anion gap: 7 (ref 5–15)
BUN: 15 mg/dL (ref 6–20)
CO2: 25 mmol/L (ref 22–32)
Calcium: 9.5 mg/dL (ref 8.9–10.3)
Chloride: 105 mmol/L (ref 98–111)
Creatinine, Ser: 0.89 mg/dL (ref 0.61–1.24)
GFR, Estimated: >60 mL/min (ref >60)
Glucose, Bld: 187 mg/dL — ABNORMAL HIGH (ref 70–99)
Potassium: 4 mmol/L (ref 3.5–5.1)
Sodium: 137 mmol/L (ref 135–145)

## 2020-09-10 LAB — TROPONIN I (HIGH SENSITIVITY): Troponin I (High Sensitivity): 9 ng/L (ref <18)

## 2020-09-10 IMAGING — DX DG CHEST 2V
2 series · 2 of 2 positions shown · non-contrast
Comparison: [DATE]

CLINICAL DATA: Chest pain

EXAM:
CHEST - 2 VIEW

[w chest pa]
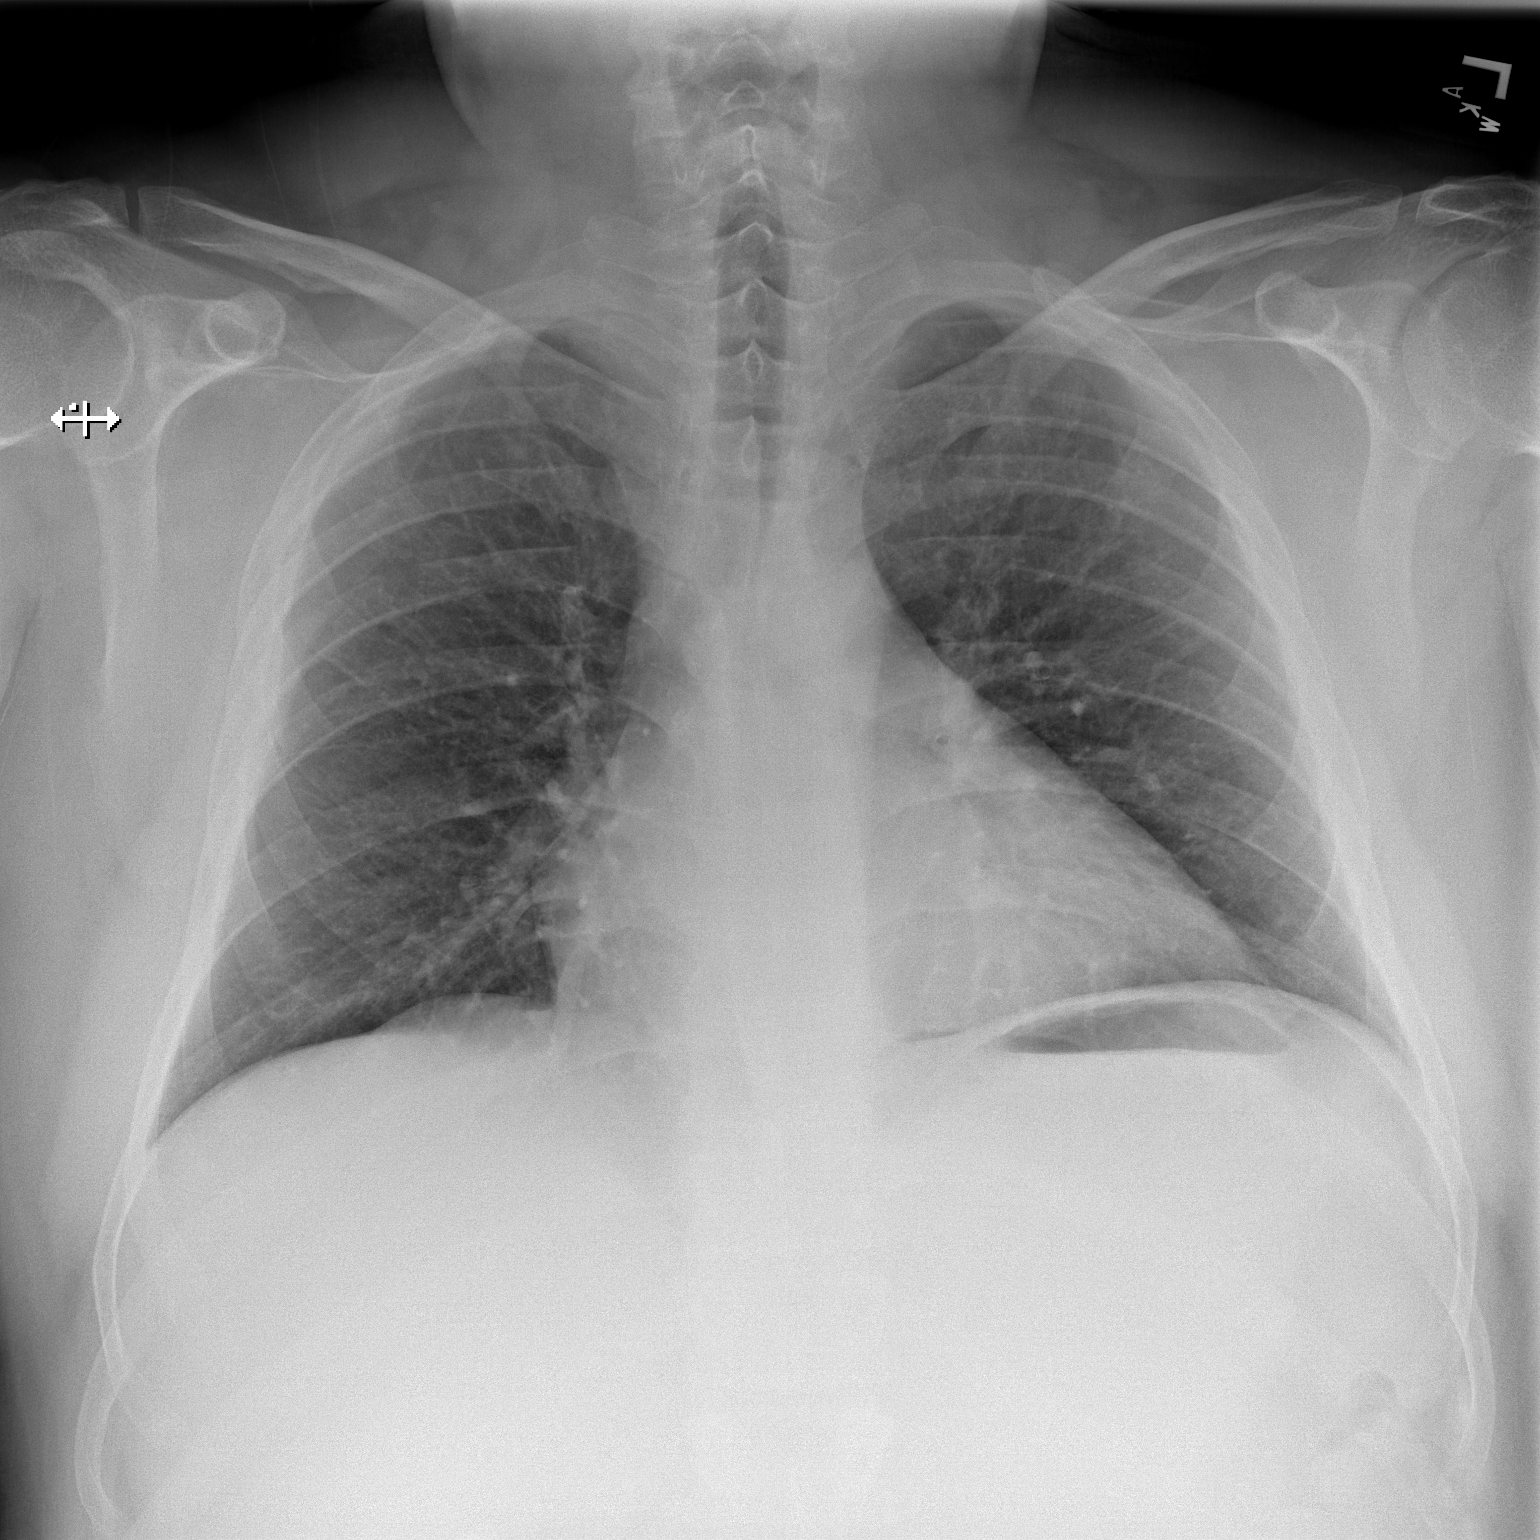

[w chest lat]
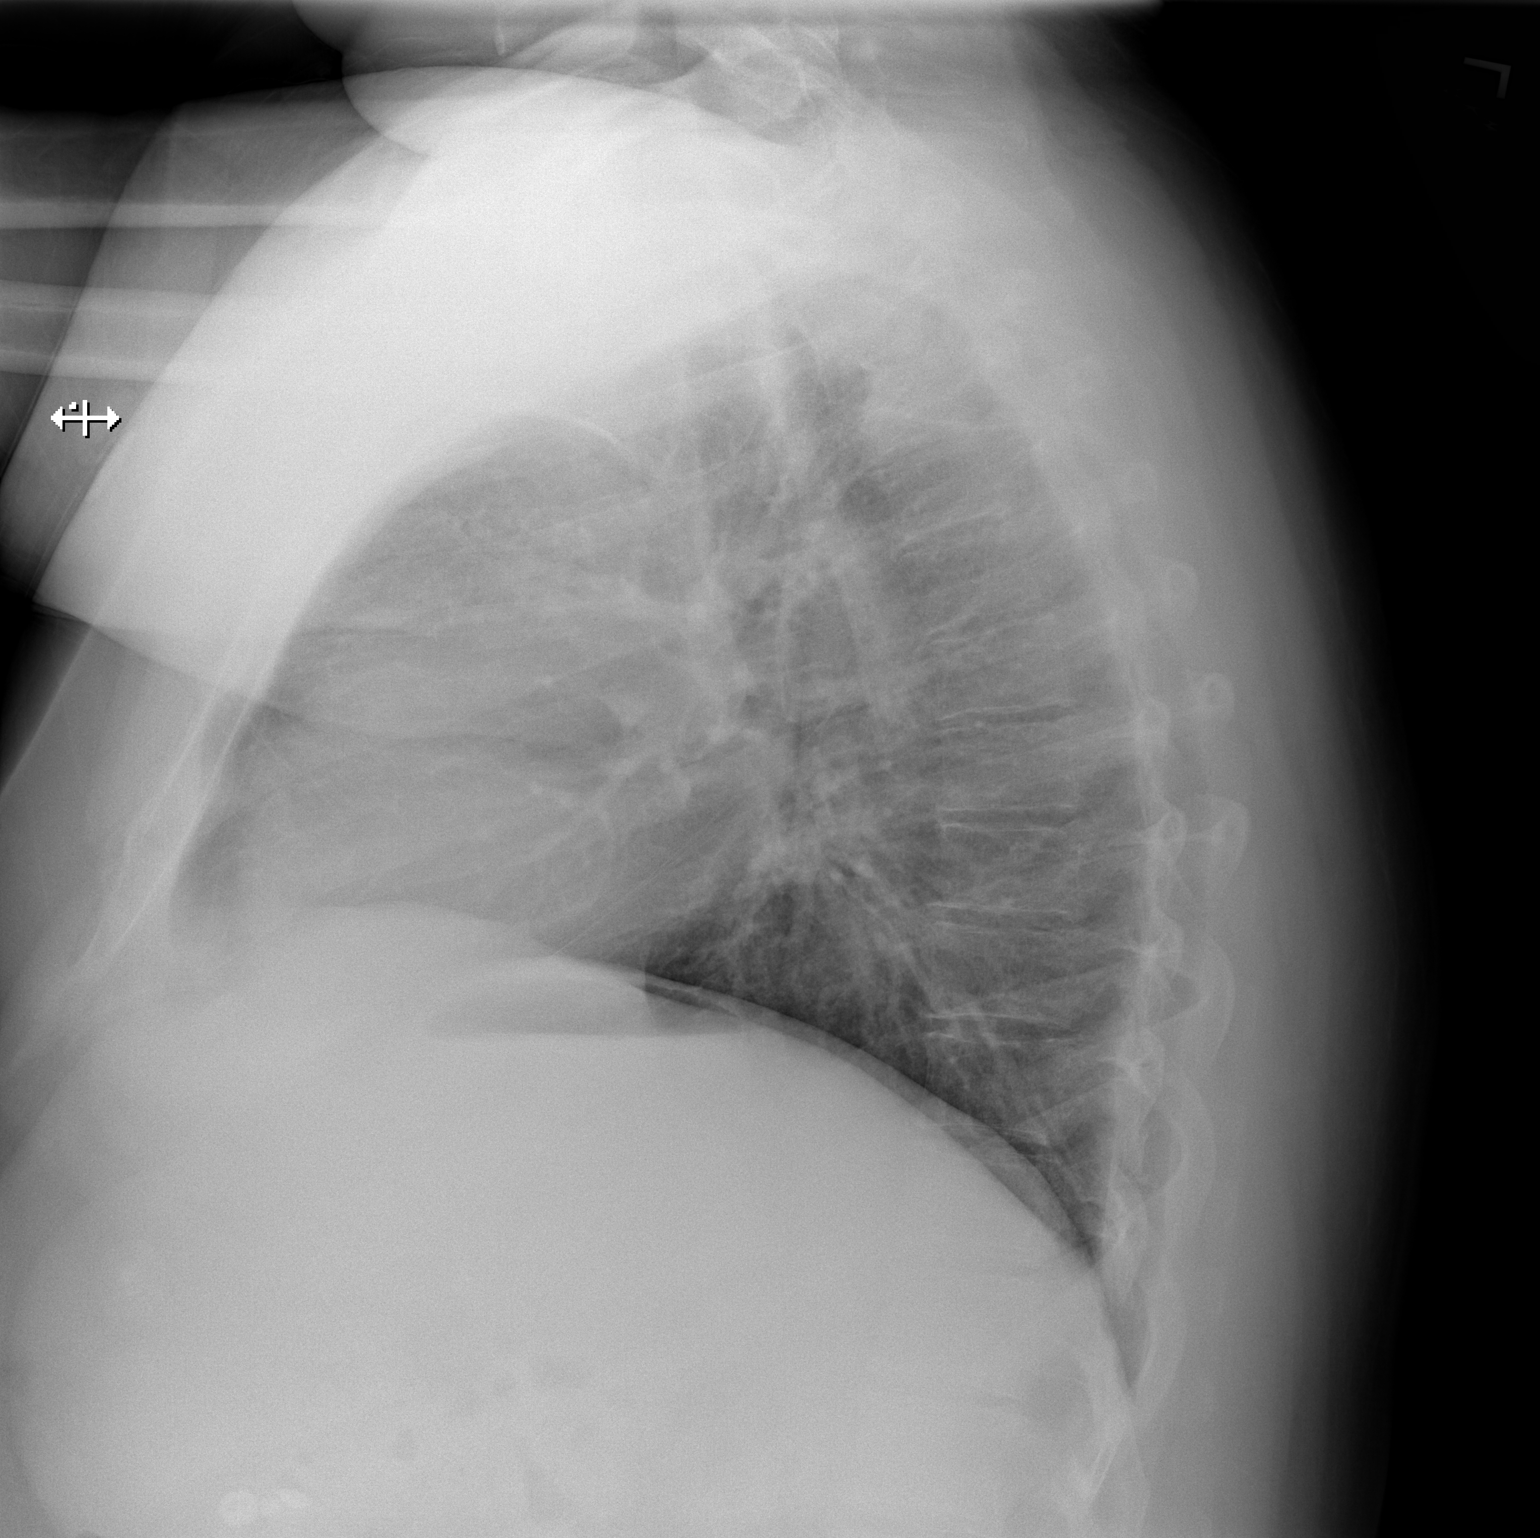

[2 of 2 positions shown; findings below may reference images not displayed]

FINDINGS: Mild cardiomegaly. No focal opacity or pleural effusion. No
pneumothorax
IMPRESSION: No active cardiopulmonary disease. Mild cardiomegaly.

## 2020-09-10 NOTE — ED Triage Notes (Signed)
Pt states he has had indigestion, low back pain, fatigue, and chest pain. Has had nausea, no vomiting. Has had dizziness and diarrhea.

## 2020-09-11 ENCOUNTER — Telehealth: Payer: Self-pay | Admitting: General Practice

## 2020-09-11 LAB — RESP PANEL BY RT-PCR (FLU A&B, COVID) ARPGX2
Influenza A by PCR: NEGATIVE
Influenza B by PCR: NEGATIVE
SARS Coronavirus 2 by RT PCR: NEGATIVE

## 2020-09-11 LAB — TROPONIN I (HIGH SENSITIVITY): Troponin I (High Sensitivity): 8 ng/L (ref <18)

## 2020-09-11 MED ORDER — HYDROCODONE-ACETAMINOPHEN 5-325 MG PO TABS
1.0000 | ORAL_TABLET | Freq: Once | ORAL | Status: AC
  Administered 2020-09-11: 1 via ORAL
  Filled 2020-09-11: qty 1

## 2020-09-11 NOTE — Discharge Instructions (Signed)
Take Tylenol for any discomfort and/or if any fever develops. Recommend trial of Prilosec twice daily to see if symptoms of chest discomfort are improved.

## 2020-09-11 NOTE — ED Provider Notes (Signed)
[facility] EMERGENCY DEPARTMENT Provider Note   CSN: [id number] Arrival date & time: 09/10/20  2045     History Chief Complaint  Patient presents with  . Chest Pain    Joshua Brady is a 45 y.o. male.  Patient with history of HLD, obesity presents for evaluation of 2-3 days of epigastric pain felt to be indigestion, "like gas", increasing fatigue, right lower back pain. Today he started having left chest pressure and a feeling of chest congestion but without cough or fever, mild dizziness and several loose stools. No nausea or vomiting. No congestion.   The history is provided by the patient. No language interpreter was used.  Chest Pain Associated symptoms: abdominal pain (epigastric discomfort), back pain, dizziness, fatigue and shortness of breath   Associated symptoms: no cough, no fever, no vomiting and no weakness        Past Medical History:  Diagnosis Date  . Borderline high cholesterol 01/25/2017   10-yr ASCVD 4.1%  . Dyslipidemia (high LDL; low HDL) 01/25/2017   10-yr ASCVD risk 4.1% (01/2017)  . Head injury 2016  . Heart murmur     Patient Active Problem List   Diagnosis Date Noted  . Lumbar degenerative disc disease 02/06/2020  . Viral illness 09/11/2019  . Low testosterone 04/29/2018  . Sleep apnea 03/11/2018  . Daytime somnolence 03/11/2018  . 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 SOB (shortness of breath) on exertion 03/03/2018  . Palpitations 03/03/2018  . Borderline high cholesterol 01/25/2017  . Dyslipidemia (high LDL; low HDL) 01/25/2017  . History of brain concussion 01/21/2017  . History of low back pain 01/21/2017    Past Surgical History:  Procedure Laterality Date  . Broken Arm    . LIVER BIOPSY         Family History  Problem Relation Age of Onset  . Alcohol abuse Father   . Other Neg Hx        low testosterone    Social History   Tobacco Use  . Smoking status: Never Smoker  . Smokeless tobacco: Never  Used  Vaping Use  . Vaping Use: Never used  Substance Use Topics  . Alcohol use: Yes  . Drug use: No    Home Medications Prior to Admission medications   Medication Sig Start Date End Date Taking? Authorizing Provider  albuterol (VENTOLIN HFA) 108 (90 Base) MCG/ACT inhaler Inhale 1-2 puffs into the lungs every 4 (four) hours as needed for wheezing or shortness of breath. 02/21/20   [name], NP  cefdinir (OMNICEF) 300 MG capsule Take 1 capsule (300 mg total) by mouth 2 (two) times daily. 06/21/20   [name], NP  lidocaine (XYLOCAINE) 2 % solution Use as directed 15 mLs in the mouth or throat every 3 (three) hours as needed (mouth/throat pain - gargle and spit). 06/21/20   Early, Coralee Pesa, NP  ondansetron (ZOFRAN) 8 MG tablet Take 1 tablet (8 mg total) by mouth every 8 (eight) hours as needed for nausea or vomiting. 02/21/20   [name], NP    Allergies    Other, Prednisone, and Topiramate  Review of Systems   Review of Systems  Constitutional: Positive for fatigue. Negative for chills and fever.  HENT: Negative.  Negative for congestion.   Respiratory: Positive for shortness of breath. Negative for cough.   Cardiovascular: Positive for chest pain. Negative for leg swelling.  Gastrointestinal: Positive for abdominal pain (epigastric discomfort) and diarrhea. Negative for vomiting.  Genitourinary: Negative.   Musculoskeletal: Positive for back pain.  Skin: Negative.   Neurological: Positive for dizziness. Negative for weakness.    Physical Exam Updated Vital Signs BP (!) 145/103 (BP Location: Left Arm)   Pulse 82   Temp 98.4 F (36.9 C) (Oral)   Resp 13   Ht 6' (1.829 m)   Wt 124.7 kg   SpO2 97%   BMI 37.30 kg/m   Physical Exam Vitals and nursing note reviewed.  Constitutional:      General: He is not in acute distress.    Appearance: He is well-developed. He is obese. He is not ill-appearing or toxic-appearing.  HENT:     Head: Normocephalic.  Cardiovascular:      Rate and Rhythm: Normal rate and regular rhythm.     Heart sounds: No murmur heard.   Pulmonary:     Effort: Pulmonary effort is normal.     Breath sounds: Normal breath sounds. No wheezing, rhonchi or rales.  Abdominal:     General: Bowel sounds are normal.     Palpations: Abdomen is soft.     Tenderness: There is no abdominal tenderness. There is no guarding or rebound.  Musculoskeletal:        General: Normal range of motion.     Cervical back: Normal range of motion and neck supple.     Right lower leg: No edema.     Left lower leg: No edema.  Skin:    General: Skin is warm and dry.     Findings: No rash.  Neurological:     Mental Status: He is alert and oriented to person, place, and time.     ED Results / Procedures / Treatments   Labs (all labs ordered are listed, but only abnormal results are displayed) Labs Reviewed  BASIC METABOLIC PANEL - Abnormal; Notable for the following components:      Result Value   Glucose, Bld 187 (*)    All other components within normal limits  RESP PANEL BY RT-PCR (FLU A&B, COVID) ARPGX2  CBC  TROPONIN I (HIGH SENSITIVITY)  TROPONIN I (HIGH SENSITIVITY)    EKG EKG Interpretation  Date/Time:  Tuesday September 10 2020 20:50:47 EDT Ventricular Rate:  93 PR Interval:  162 QRS Duration: 84 QT Interval:  340 QTC Calculation: 422 R Axis:   81 Text Interpretation: Normal sinus rhythm Confirmed by Randal Buba, April ([id number]) on 09/11/2020 12:00:50 AM   Radiology DG Chest 2 View  Result Date: 09/10/2020 CLINICAL DATA:  Chest pain EXAM: CHEST - 2 VIEW COMPARISON:  03/03/2018 FINDINGS: Mild cardiomegaly. No focal opacity or pleural effusion. No pneumothorax IMPRESSION: No active cardiopulmonary disease. Mild cardiomegaly. Electronically Signed   By: Donavan Foil M.D.   On: 09/10/2020 21:34    Procedures Procedures   Medications Ordered in ED Medications  HYDROcodone-acetaminophen (NORCO/VICODIN) 5-325 MG per tablet 1 tablet (has no  administration in time range)    ED Course  I have reviewed the triage vital signs and the nursing notes.  Pertinent labs & imaging results that were available during my care of the patient were reviewed by me and considered in my medical decision making (see chart for details).    MDM Rules/Calculators/A&P                          Patient to ED with ss/sxs as per HPI.   VSS, mild hypertension. He is nontoxic in appearance. Labs reviewed and show  mild hyperglycemia of 187, initial trop negative of 9. No other lab abnormalities. CXR clear. EKG without acute change. COVID ordered, delta trop pending.   On recheck, the patient is sleeping soundly. VSS.   Delta trop negative, COVID, flu negative. The patient is feeling better. Can be discharged home with PCP follow up for recheck in 2-3 days. Return to the ED with any worsening or new symptoms.   Final Clinical Impression(s) / ED Diagnoses Final diagnoses:  None   1. Nonspecific chest pain 2. Fatigue   Rx / DC Orders ED Discharge Orders    None       Charlann Lange, PA-C 09/11/20 Zena, April, MD 09/11/20 [phone]

## 2020-09-11 NOTE — Telephone Encounter (Signed)
Transition Care Management Follow-up Telephone Call Date of discharge and from where: [facility] on 09[facility]11[facility]. How have you been since you were released from the hospital? Still has the cold symptoms and woozy in the head.  Any questions or concerns? No  Items Reviewed: Did the pt receive and understand the discharge instructions provided? Yes  Medications obtained and verified? Yes  Other? No  Any new allergies since your discharge? Yes  Dietary orders reviewed? Yes Do you have support at home? Yes   Functional Questionnaire: (I = Independent and D = Dependent) ADLs: I  Bathing/Dressing- I  Meal Prep- I  Eating- I  Maintaining continence- I  Transferring/Ambulation- I  Managing Meds- I  Follow up appointments reviewed:  PCP Hospital f/u appt confirmed? Yes Patient will call to schedule his appointment to see Dr. Sheppard Coil. [facility] f/u appt confirmed?  Patient was not referred to a specialist . Are transportation arrangements needed? No  If their condition worsens, is the pt aware to call PCP or go to the Emergency Dept.? Yes Was the patient provided with contact information for the PCP's office or ED? Yes Was to pt encouraged to call back with questions or concerns? Yes

## 2020-09-12 ENCOUNTER — Ambulatory Visit (INDEPENDENT_AMBULATORY_CARE_PROVIDER_SITE_OTHER): Payer: PRIVATE HEALTH INSURANCE | Admitting: Osteopathic Medicine

## 2020-09-12 ENCOUNTER — Other Ambulatory Visit: Payer: Self-pay | Admitting: Osteopathic Medicine

## 2020-09-12 ENCOUNTER — Other Ambulatory Visit: Payer: Self-pay

## 2020-09-12 ENCOUNTER — Encounter: Payer: Self-pay | Admitting: Osteopathic Medicine

## 2020-09-12 VITALS — BP 139/81 | HR 86 | Temp 98.9°F | Wt 284.1 lb

## 2020-09-12 DIAGNOSIS — R0789 Other chest pain: Secondary | ICD-10-CM

## 2020-09-12 DIAGNOSIS — R5382 Chronic fatigue, unspecified: Secondary | ICD-10-CM

## 2020-09-12 MED ORDER — BUDESONIDE-FORMOTEROL FUMARATE 160-4.5 MCG/ACT IN AERO: 2.0000 | INHALATION_SPRAY | Freq: Two times a day (BID) | RESPIRATORY_TRACT | 1 refills | Status: DC

## 2020-09-12 MED FILL — SYMBICORT 160-4.5 MCG INH: 160-4.5 | 30 days supply | Qty: 10 | Fill #0

## 2020-09-12 NOTE — Patient Instructions (Addendum)
Inhaler sent Let me know if this isn't working  Referral back to cardiology  Consider re-do sleep study

## 2020-09-12 NOTE — Progress Notes (Signed)
Joshua Brady is a 45 y.o. male who presents to  [facility]  today, 09/12/20, seeking care for the following:  . Hospital follow-up - chest pain o ED visit for 2-3 days epigastric pain, increased fatigu, lower back pain, w/ 1 day of L sided chest pressure and chest congestion w/o cough/fever, mild dizziness, several loose stool. Tropes WNL, COVID/Flu not detected, EKG NSR, CXR WNL. CBC and BMP ok other than Glc 187 but this was nonfasting o Relevant PMH: concussion few years ago, recent nonspecific viral type illness 06/2020 negative for COVID/Flu. Had [place] in 2020. Cardiac: Echocardiogram 02/2018 done d/t murmur was normal; normal Holter 03/2018 other than infrequent premature atrial beats w/ one 5-beat run narrow complex tachycardia, infrequent PVC. Previous sleep stufy was borderline.  Concerns today:  o Concerns about attention/concerntraion problems and difficulty following conversations. He does well in higher-stress situations, brain seems to focus when it "needs to" but his mind wanders in less demanding settigns.  o Concerns about persistent fatigue.      ASSESSMENT & PLAN with other pertinent findings:  The primary encounter diagnosis was Chest pressure. A diagnosis of Chronic fatigue was also pertinent to this visit.   Likely multifactorial: sounds like recent viral infection (can never r/o false negative COVID) +/- concussion history causeing some mild cognitive issues +/- deconditioning +/- sleep apnea (may consider repeat sleep study) +/- adult onset asthma (trial inhaler starting today, consider pulmonary referral) +/- other respiratory issue (pt worried about inhaled irritants as he does lots of [facility], consider stress testing  Symbicort inhaler trial, pulm referral if this isn't helping, previously LFT's ok   Patient Instructions  Inhaler sent Let me know if this isn't working  Referral  back to cardiology  Consider re-do sleep study     Orders Placed This Encounter  Procedures  . Ambulatory referral to Cardiology    Meds ordered this encounter  Medications  . budesonide-formoterol (SYMBICORT) 160-4.5 MCG/ACT inhaler    Sig: Inhale 2 puffs into the lungs 2 (two) times daily.    Dispense:  1 each    Refill:  1     See below for relevant physical exam findings  See below for recent lab and imaging results reviewed  Medications, allergies, PMH, PSH, SocH, FamH reviewed below    Follow-up instructions: Return in about 3 months (around 12/12/2020) for ANNUAL CHECK-UP - SEE [place] SOONER IF NEEDED.                                        Exam:  BP 139/81 (BP Location: Left Arm, Patient Position: Sitting, Cuff Size: Large)   Pulse 86   Temp 98.9 F (37.2 C) (Oral)   Wt 284 lb 1.3 oz (128.9 kg)   SpO2 97%   BMI 38.53 kg/m   Constitutional: VS see above. General Appearance: alert, well-developed, well-nourished, NAD  Neck: No masses, trachea midline.   Respiratory: Normal respiratory effort. no wheeze, no rhonchi, no rales  Cardiovascular: S1/S2 normal, no murmur, no rub/gallop auscultated. RRR.   Musculoskeletal: Gait normal. Symmetric and independent movement of all extremities  Neurological: Normal balance/coordination. No tremor.  Skin: warm, dry, intact.   Psychiatric: Normal judgment/insight. Normal mood and affect. Oriented x3.   Current Meds  Medication Sig  . budesonide-formoterol (SYMBICORT) 160-4.5 MCG/ACT inhaler Inhale 2 puffs into the  lungs 2 (two) times daily.    Allergies  Allergen Reactions  . Other     Ants - see notes from ER visit 02/2018  . Prednisone   . Topiramate     Anxiety, dizzy, depressed, suicidal thoughts, agitated    Patient Active Problem List   Diagnosis Date Noted  . Lumbar degenerative disc disease 02/06/2020  . Viral illness 09/11/2019  . Low testosterone 04/29/2018  .  Sleep apnea 03/11/2018  . Daytime somnolence 03/11/2018  . 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 SOB (shortness of breath) on exertion 03/03/2018  . Palpitations 03/03/2018  . Borderline high cholesterol 01/25/2017  . Dyslipidemia (high LDL; low HDL) 01/25/2017  . History of brain concussion 01/21/2017  . History of low back pain 01/21/2017    Family History  Problem Relation Age of Onset  . Alcohol abuse Father   . Other Neg Hx        low testosterone    Social History   Tobacco Use  Smoking Status Never Smoker  Smokeless Tobacco Never Used    Past Surgical History:  Procedure Laterality Date  . Broken Arm    . LIVER BIOPSY      Immunization History  Administered Date(s) Administered  . Influenza-Unspecified 02/04/2017  . Tdap 11/01/2015    Recent Results (from the past 2160 hour(s))  CMV abs, IgG+IgM (cytomegalovirus)     Status: Abnormal   Collection Time: 06/21/20 11:05 AM  Result Value Ref Range   Cytomegalovirus Ab-IgG >10.00 (H) U/mL    Comment:                            U/mL         Interpretation                            -----         --------------                            <0.60         Negative                            0.60-0.69     Equivocal                            > or = 0.70   Positive . A positive result indicates that the patient has  antibody to CMV. It does not differentiate between an active or past infection.    CMV IgM <30.00 AU/mL    Comment:     AU/mL                 Interpretation     -----                 --------------     <30.00                No Antibody Detected     30.00-34.99           Equivocal     > or = 35.00          Antibody Detected . Results from any one IgM assay should not be used as a sole determinant of a current or recent infection.  Because an IgM test can yield false positive results and  low level IgM antibody may persist for more than 12  months post infection, reliance on a single test result   could be misleading. Acute infection is best diagnosed  by demonstrating the conversion of IgG from negative to  positive. If an acute infection is suspected, consider  obtaining a new specimen and submit for both IgG and IgM  testing in two or more weeks. Marland Kitchen   CBC with Differential     Status: Abnormal   Collection Time: 06/21/20 11:05 AM  Result Value Ref Range   WBC 16.3 (H) 3.8 - 10.8 Thousand/uL   RBC 5.29 4.20 - 5.80 Million/uL   Hemoglobin 15.7 13.2 - 17.1 g/dL   HCT 45.2 38.5 - 50.0 %   MCV 85.4 80.0 - 100.0 fL   MCH 29.7 27.0 - 33.0 pg   MCHC 34.7 32.0 - 36.0 g/dL   RDW 12.5 11.0 - 15.0 %   Platelets 323 140 - 400 Thousand/uL   MPV 11.5 7.5 - 12.5 fL   Neutro Abs 12,323 (H) 1,500 - 7,800 cells/uL   Lymphs Abs 2,575 850 - 3,900 cells/uL   Absolute Monocytes 1,386 (H) 200 - 950 cells/uL   Eosinophils Absolute 0 (L) 15 - 500 cells/uL   Basophils Absolute 16 0 - 200 cells/uL   Neutrophils Relative % 75.6 %   Total Lymphocyte 15.8 %   Monocytes Relative 8.5 %   Eosinophils Relative 0.0 %   Basophils Relative 0.1 %  COMPLETE METABOLIC PANEL WITH GFR     Status: Abnormal   Collection Time: 06/21/20 11:05 AM  Result Value Ref Range   Glucose, Bld 129 65 - 139 mg/dL    Comment: .        Non-fasting reference interval .    BUN 19 7 - 25 mg/dL   Creat 0.95 0.60 - 1.35 mg/dL   GFR, Est Non African American 96 > OR = 60 mL/min/1.39m2   GFR, Est African American 111 > OR = 60 mL/min/1.66m2   BUN/Creatinine Ratio NOT APPLICABLE 6 - 22 (calc)   Sodium 139 135 - 146 mmol/L   Potassium 4.4 3.5 - 5.3 mmol/L   Chloride 102 98 - 110 mmol/L   CO2 26 20 - 32 mmol/L   Calcium 10.2 8.6 - 10.3 mg/dL   Total Protein 7.7 6.1 - 8.1 g/dL   Albumin 4.5 3.6 - 5.1 g/dL   Globulin 3.2 1.9 - 3.7 g/dL (calc)   AG Ratio 1.4 1.0 - 2.5 (calc)   Total Bilirubin 0.6 0.2 - 1.2 mg/dL   Alkaline phosphatase (APISO) 88 36 - 130 U/L   AST 28 10 - 40 U/L   ALT 53 (H) 9 - 46 U/L  Mononucleosis  screen     Status: None   Collection Time: 06/21/20 11:05 AM  Result Value Ref Range   Heterophile, Mono Screen NEGATIVE NEGATIVE  Basic metabolic panel     Status: Abnormal   Collection Time: 09/10/20  9:09 PM  Result Value Ref Range   Sodium 137 135 - 145 mmol/L   Potassium 4.0 3.5 - 5.1 mmol/L   Chloride 105 98 - 111 mmol/L   CO2 25 22 - 32 mmol/L   Glucose, Bld 187 (H) 70 - 99 mg/dL    Comment: Glucose reference range applies only to samples taken after fasting for at least 8 hours.   BUN 15 6 - 20 mg/dL   Creatinine, Ser  0.89 0.61 - 1.24 mg/dL   Calcium 9.5 8.9 - 10.3 mg/dL   GFR, Estimated >60 >60 mL/min    Comment: (NOTE) Calculated using the CKD-EPI Creatinine Equation (2021)    Anion gap 7 5 - 15    Comment: Performed at [facility] [address] [place], [name] [address]  CBC     Status: None   Collection Time: 09/10/20  9:09 PM  Result Value Ref Range   WBC 9.7 4.0 - 10.5 K/uL   RBC 5.00 4.22 - 5.81 MIL/uL   Hemoglobin 14.7 13.0 - 17.0 g/dL   HCT 43.8 39.0 - 52.0 %   MCV 87.6 80.0 - 100.0 fL   MCH 29.4 26.0 - 34.0 pg   MCHC 33.6 30.0 - 36.0 g/dL   RDW 13.3 11.5 - 15.5 %   Platelets 321 150 - 400 K/uL   nRBC 0.0 0.0 - 0.2 %    Comment: Performed at [facility], [place] [address] [place], [name] [address]  Troponin I (High Sensitivity)     Status: None   Collection Time: 09/10/20  9:09 PM  Result Value Ref Range   Troponin I (High Sensitivity) 9 <18 ng/L    Comment: (NOTE) Elevated high sensitivity troponin I (hsTnI) values and significant  changes across serial measurements may suggest ACS but many other  chronic and acute conditions are known to elevate hsTnI results.  Refer to the "Links" section for chest pain algorithms and additional  guidance. Performed at [facility], [place] [address], [place], [name] [address]   Troponin I (High Sensitivity)     Status: None   Collection Time: 09/10/20 11:07 PM  Result Value Ref Range    Troponin I (High Sensitivity) 8 <18 ng/L    Comment: (NOTE) Elevated high sensitivity troponin I (hsTnI) values and significant  changes across serial measurements may suggest ACS but many other  chronic and acute conditions are known to elevate hsTnI results.  Refer to the "Links" section for chest pain algorithms and additional  guidance. Performed at [facility], [place] [address], [place], [place] [address]   Resp Panel by RT-PCR (Flu A&B, Covid) Nasopharyngeal Swab     Status: None   Collection Time: 09/11/20 12:43 AM   Specimen: Nasopharyngeal Swab; Nasopharyngeal(NP) swabs in vial transport medium  Result Value Ref Range   SARS Coronavirus 2 by RT PCR NEGATIVE NEGATIVE    Comment: (NOTE) SARS-CoV-2 target nucleic acids are NOT DETECTED.  The SARS-CoV-2 RNA is generally detectable in upper respiratory specimens during the acute phase of infection. The lowest concentration of SARS-CoV-2 viral copies this assay can detect is 138 copies/mL. A negative result does not preclude SARS-Cov-2 infection and should not be used as the sole basis for treatment or other patient management decisions. A negative result may occur with  improper specimen collection/handling, submission of specimen other than nasopharyngeal swab, presence of viral mutation(s) within the areas targeted by this assay, and inadequate number of viral copies(<138 copies/mL). A negative result must be combined with clinical observations, patient history, and epidemiological information. The expected result is Negative.  Fact Sheet for Patients:  EntrepreneurPulse.com.au  Fact Sheet for Healthcare Providers:  IncredibleEmployment.be  This test is no t yet approved or cleared by the [place] FDA and  has been authorized for detection and/or diagnosis of SARS-CoV-2 by FDA under an Emergency Use Authorization (EUA). This EUA will remain  in effect (meaning this test can  be  used) for the duration of the COVID-19 declaration under Section 564(b)(1) of the Act, 21 U.S.C.section 360bbb-3(b)(1), unless the authorization is terminated  or revoked sooner.       Influenza A by PCR NEGATIVE NEGATIVE   Influenza B by PCR NEGATIVE NEGATIVE    Comment: (NOTE) The Xpert Xpress SARS-CoV-2/FLU/RSV plus assay is intended as an aid in the diagnosis of influenza from Nasopharyngeal swab specimens and should not be used as a sole basis for treatment. Nasal washings and aspirates are unacceptable for Xpert Xpress SARS-CoV-2/FLU/RSV testing.  Fact Sheet for Patients: EntrepreneurPulse.com.au  Fact Sheet for Healthcare Providers: IncredibleEmployment.be  This test is not yet approved or cleared by the [place] FDA and has been authorized for detection and/or diagnosis of SARS-CoV-2 by FDA under an Emergency Use Authorization (EUA). This EUA will remain in effect (meaning this test can be used) for the duration of the COVID-19 declaration under Section 564(b)(1) of the Act, 21 U.S.C. section 360bbb-3(b)(1), unless the authorization is terminated or revoked.  Performed at [facility], [place] [address] [place], [place] [address]     DG Chest 2 View  Result Date: 09/10/2020 CLINICAL DATA:  Chest pain EXAM: CHEST - 2 VIEW COMPARISON:  03/03/2018 FINDINGS: Mild cardiomegaly. No focal opacity or pleural effusion. No pneumothorax IMPRESSION: No active cardiopulmonary disease. Mild cardiomegaly. Electronically Signed   By: Donavan Foil M.D.   On: 09/10/2020 21:34       All questions at time of visit were answered - patient instructed to contact office with any additional concerns or updates. ER/RTC precautions were reviewed with the patient as applicable.   Please note: manual typing as well as voice recognition software may have been used to produce this document - typos may escape review. Please contact Dr. Sheppard Coil for  any needed clarifications.

## 2020-09-27 ENCOUNTER — Encounter: Payer: Self-pay | Admitting: Osteopathic Medicine

## 2020-10-01 ENCOUNTER — Other Ambulatory Visit (HOSPITAL_BASED_OUTPATIENT_CLINIC_OR_DEPARTMENT_OTHER): Payer: Self-pay

## 2020-10-01 MED ORDER — AMOXICILLIN-POT CLAVULANATE 875-125 MG PO TABS
1.0000 | ORAL_TABLET | Freq: Two times a day (BID) | ORAL | 0 refills | Status: DC
  Filled 2020-10-01: qty 14, 7d supply, fill #0

## 2020-10-24 ENCOUNTER — Other Ambulatory Visit: Payer: Self-pay

## 2020-10-24 DIAGNOSIS — R011 Cardiac murmur, unspecified: Secondary | ICD-10-CM | POA: Insufficient documentation

## 2020-10-29 ENCOUNTER — Ambulatory Visit (INDEPENDENT_AMBULATORY_CARE_PROVIDER_SITE_OTHER): Payer: PRIVATE HEALTH INSURANCE | Admitting: Cardiology

## 2020-10-29 ENCOUNTER — Encounter: Payer: Self-pay | Admitting: Cardiology

## 2020-10-29 ENCOUNTER — Other Ambulatory Visit: Payer: Self-pay

## 2020-10-29 VITALS — BP 136/76 | HR 76 | Ht 72.0 in | Wt 283.0 lb

## 2020-10-29 DIAGNOSIS — R0789 Other chest pain: Secondary | ICD-10-CM | POA: Diagnosis not present

## 2020-10-29 DIAGNOSIS — E785 Hyperlipidemia, unspecified: Secondary | ICD-10-CM

## 2020-10-29 DIAGNOSIS — R002 Palpitations: Secondary | ICD-10-CM

## 2020-10-29 MED ORDER — ASPIRIN EC 81 MG PO TBEC: 81.0000 mg | DELAYED_RELEASE_TABLET | Freq: Every day | ORAL | 3 refills | Status: DC

## 2020-10-29 NOTE — Patient Instructions (Signed)
Medication Instructions:  Your physician has recommended you make the following change in your medication:   START: Aspirin 81 mg daily    *If you need a refill on your cardiac medications before your next appointment, please call your pharmacy*   Lab Work: Your physician recommends that you return for lab work today: lipid  If you have labs (blood work) drawn today and your tests are completely normal, you will receive your results only by: Marland Kitchen MyChart Message (if you have MyChart) OR . A paper copy in the mail If you have any lab test that is abnormal or we need to change your treatment, we will call you to review the results.   Testing/Procedures: Your physician has requested that you have an exercise tolerance test. For further information please visit [email or web address]. Please also follow instruction sheet, as given.     Follow-Up: At [facility], you and your health needs are our priority.  As part of our continuing mission to provide you with exceptional heart care, we have created designated Provider Care Teams.  These Care Teams include your primary Cardiologist (physician) and Advanced Practice Providers (APPs -  Physician Assistants and Nurse Practitioners) who all work together to provide you with the care you need, when you need it.  We recommend signing up for the patient portal called "MyChart".  Sign up information is provided on this After Visit Summary.  MyChart is used to connect with patients for Virtual Visits (Telemedicine).  Patients are able to view lab/test results, encounter notes, upcoming appointments, etc.  Non-urgent messages can be sent to your provider as well.   To learn more about what you can do with MyChart, go to [email or web address].    Your next appointment:   2 month(s)  The format for your next appointment:   In Person  Provider:   Jenne Campus, MD   Other Instructions   Aspirin and Your Heart Aspirin is a medicine that  prevents the platelets in your blood from sticking together. Platelets are the cells that your blood uses for clotting. Aspirin can be used to help reduce the risk of blood clots, heart attacks, and other heart-related problems. What are the risks? Daily use of aspirin can cause side effects. Some of these include:  Bleeding. Bleeding can be minor or serious. An example of minor bleeding is bleeding from a cut, and the bleeding does not stop. An example of more serious bleeding is stomach bleeding or, rarely, bleeding into the brain. Your risk of bleeding increases if you are also taking NSAIDs, such as ibuprofen.  Increased bruising.  Upset stomach.  An allergic reaction. People who have growths inside the nose (nasal polyps) have an increased risk of developing an aspirin allergy. How to use aspirin to care for your heart  Take aspirin only as told by your health care provider. Make sure that you understand how much to take and what form to take. The two forms of aspirin are: ? Non-enteric-coated.This type of aspirin does not have a coating and is absorbed quickly. This type of aspirin also comes in a chewable form. ? Enteric-coated. This type of aspirin has a coating that releases the medicine very slowly. Enteric-coated aspirin might cause less stomach upset than non-enteric-coated aspirin. This type of aspirin should not be chewed or crushed.  Work with your health care provider to find out whether it is safe and beneficial for you to take aspirin daily. Taking aspirin daily may be helpful if: ? You  have had a heart attack or chest pain, or you are at risk for a heart attack. ? You have a condition in which certain heart vessels are blocked (coronary artery disease), and you have had a procedure to treat it. Examples are:  Open-heart surgery, such as coronary artery bypass surgery (CABG).  Coronary angioplasty,which is done to widen a blood vessel of your heart.  Having a small mesh  tube, or stent, placed in your coronary artery. ? You have had certain types of stroke or a mini-stroke known as a transient ischemic attack (TIA). ? You have a narrowing of the arteries that supply the limbs (peripheral artery disease, or PAD). ? You have long-term (chronic) heart rhythm problems, such as atrial fibrillation, and your health care provider thinks aspirin may help. ? You have valve disease or have had surgery on a valve. ? You are considered at increased risk of developing coronary artery disease or PAD.   Follow these instructions at home Medicines  Take over-the-counter and prescription medicines only as told by your health care provider.  If you are taking blood thinners: ? Talk with your health care provider before you take any medicines that contain aspirin or NSAIDs, such as ibuprofen. These medicines increase your risk for dangerous bleeding. ? Take your medicine exactly as told, at the same time every day. ? Avoid activities that could cause injury or bruising, and follow instructions about how to prevent falls. ? Wear a medical alert bracelet or carry a card that lists what medicines you take. General instructions  Do not drink alcohol if: ? Your health care provider tells you not to drink. ? You are pregnant, may be pregnant, or are planning to become pregnant.  If you drink alcohol: ? Limit how much you use to:  0-1 drink a day for women.  0-2 drinks a day for men. ? Be aware of how much alcohol is in your drink. In the [place] one drink equals one 12 oz bottle of beer (355 mL), one 5 oz glass of wine (148 mL), or one 1 oz glass of hard liquor (44 mL).  Keep all follow-up visits as told by your health care provider. This is important. Where to find more information  The American Heart Association: www.heart.org Contact a health care provider if you have:  Unusual bleeding or bruising.  Stomach pain or nausea.  Ringing in your ears.  An allergic  reaction that causes hives, itchy skin, or swelling of the lips, tongue, or face. Get help right away if:  You notice that your bowel movements are bloody, or dark red or black in color.  You vomit or cough up blood.  You have blood in your urine.  You cough, breathe loudly (wheeze), or feel short of breath.  You have chest pain, especially if the pain spreads to your arms, back, neck, or jaw.  You have a headache with confusion. You have any symptoms of a stroke. "BE FAST" is an easy way to remember the main warning signs of a stroke:  B - Balance. Signs are dizziness, sudden trouble walking, or loss of balance.  E - Eyes. Signs are trouble seeing or a sudden change in vision.  F - Face. Signs are sudden weakness or numbness of the face, or the face or eyelid drooping on one side.  A - Arms. Signs are weakness or numbness in an arm. This happens suddenly and usually on one side of the body.  S -  Speech. Signs are sudden trouble speaking, slurred speech, or trouble understanding what people say.  T - Time. Time to call emergency services. Write down what time symptoms started. You have other signs of a stroke, such as:  A sudden, severe headache with no known cause.  Nausea or vomiting.  Seizure. These symptoms may represent a serious problem that is an emergency. Do not wait to see if the symptoms will go away. Get medical help right away. Call your local emergency services (911 in the [place] Do not drive yourself to the hospital. Summary  Aspirin use can help reduce the risk of blood clots, heart attacks, and other heart-related problems.  Daily use of aspirin can cause side effects.  Take aspirin only as told by your health care provider. Make sure that you understand how much to take and what form to take.  Your health care provider will help you determine whether it is safe and beneficial for you to take aspirin daily. This information is not intended to replace  advice given to you by your health care provider. Make sure you discuss any questions you have with your health care provider. Document Revised: 03/06/2019 Document Reviewed: 03/06/2019 Elsevier Patient Education  [place].

## 2020-10-29 NOTE — Progress Notes (Signed)
Cardiology Office Note:    Date:  10/29/2020   ID:  [name], DOB 1976-04-27, MRN [id number]  PCP:  Emeterio Reeve, DO  Cardiologist:  Jenne Campus, MD    Referring MD: Emeterio Reeve, DO   Chief Complaint  Patient presents with  . Follow-up    C/o not sleeping well and chest pressure.     History of Present Illness:    Kelsey Edman is a 45 y.o. male I did see Elta Guadeloupe few years ago when he was evaluated for palpitations.  He was found to have some extrasystole none of this was dangerous also dyslipidemia we initiated treatment for cholesterol diet, however he had difficulty tolerating cholesterol medication.  This time he comes to the office with different complaint this time is of chest pain.  He said chest sensation is there all the time he wakes up with this he goes to sleep with that.  He used to be very active he used to do bike patrolling however he does not do it anymore and gained some weight and stopped exercising.  He said he get tired very easily.  He gets short of breath quite easily he said sometimes pain that he always got in the chest get worse with exercises. He does not smoke I did try him before on Lipitor for his dyslipidemia however he was unable to tolerate it and stopped it. He does not exercise on the regular basis but planning to return to exercises.  Past Medical History:  Diagnosis Date  . Borderline high cholesterol 01/25/2017   10-yr ASCVD 4.1%  . Daytime somnolence 03/11/2018  . Dyslipidemia (high LDL; low HDL) 01/25/2017   10-yr ASCVD risk 4.1% (01/2017)  . Head injury 2016  . Heart murmur   . History of brain concussion 01/21/2017  . History of low back pain 01/21/2017  . 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 Low testosterone 04/29/2018  . Lumbar degenerative disc disease 02/06/2020  . Palpitations 03/03/2018  . Sleep apnea 03/11/2018  . SOB (shortness of breath) on exertion 03/03/2018  . Viral illness 09/11/2019    Past Surgical History:   Procedure Laterality Date  . Broken Arm    . LIVER BIOPSY      Current Medications: No outpatient medications have been marked as taking for the 10/29/20 encounter (Office Visit) with Park Liter, MD.     Allergies:   Other, Prednisone, and Topiramate   Social History   Socioeconomic History  . Marital status: Married    Spouse name: Not on file  . Number of children: Not on file  . Years of education: Not on file  . Highest education level: Not on file  Occupational History  . Not on file  Tobacco Use  . Smoking status: Never Smoker  . Smokeless tobacco: Never Used  Vaping Use  . Vaping Use: Never used  Substance and Sexual Activity  . Alcohol use: Yes  . Drug use: No  . Sexual activity: Yes    Birth control/protection: None  Other Topics Concern  . Not on file  Social History Narrative  . Not on file   Social Determinants of Health   Financial Resource Strain: Not on file  Food Insecurity: Not on file  Transportation Needs: Not on file  Physical Activity: Not on file  Stress: Not on file  Social Connections: Not on file     Family History: The patient's family history includes Alcohol abuse in his father. There is no history of  Other. ROS:   Please see the history of present illness.    All 14 point review of systems negative except as described per history of present illness  EKGs/Labs/Other Studies Reviewed:      Recent Labs: 06/21/2020: ALT 53 09/10/2020: BUN 15; Creatinine, Ser 0.89; Hemoglobin 14.7; Platelets 321; Potassium 4.0; Sodium 137  Recent Lipid Panel    Component Value Date/Time   CHOL 279 (H) 03/03/2018 0914   TRIG 256 (H) 03/03/2018 0914   HDL 41 03/03/2018 0914   CHOLHDL 6.8 (H) 03/03/2018 0914   VLDL 45 (H) 01/21/2017 0859   LDLCALC 192 (H) 03/03/2018 0914    Physical Exam:    VS:  BP 136/76 (BP Location: Right Arm, Patient Position: Sitting)   Pulse 76   Ht 6' (1.829 m)   Wt 283 lb (128.4 kg)   SpO2 95%   BMI  38.38 kg/m     Wt Readings from Last 3 Encounters:  10/29/20 283 lb (128.4 kg)  09/12/20 284 lb 1.3 oz (128.9 kg)  09/10/20 275 lb (124.7 kg)     GEN:  Well nourished, well developed in no acute distress HEENT: Normal NECK: No JVD; No carotid bruits LYMPHATICS: No lymphadenopathy CARDIAC: RRR, no murmurs, no rubs, no gallops RESPIRATORY:  Clear to auscultation without rales, wheezing or rhonchi  ABDOMEN: Soft, non-tender, non-distended MUSCULOSKELETAL:  No edema; No deformity  SKIN: Warm and dry LOWER EXTREMITIES: no swelling NEUROLOGIC:  Alert and oriented x 3 PSYCHIATRIC:  Normal affect   ASSESSMENT:    1. Palpitations   2. Atypical chest pain   3. Dyslipidemia    PLAN:    In order of problems listed above:  1. Atypical chest pain.  This is a new problem.  He does have enough worrisome characteristics that stress test will be warranted.  Probably plan EKG treadmill stress test will be sufficient.  I asked him to start taking 1 baby aspirin until we have the test available. 2. Dyslipidemia I do have his K PN which show me his LDL of 192 which is UNACCEPTABLY high.  We will repeat his fasting lipid profile and then hopefully will be able to put him on Crestor that he will be able to tolerate. 3. We did talk about healthy lifestyle need to exercise on the regular basis I did recommend Mediterranean diet. 4. Obstructive sleep apnea which probably got worse right now because of his weight gain.  Hopefully when we do stress test which I hope will be negative will be able to convince him to exercise lose some weight that will get better and if that is not the case then we will get sleep study again.   Medication Adjustments/Labs and Tests Ordered: Current medicines are reviewed at length with the patient today.  Concerns regarding medicines are outlined above.  No orders of the defined types were placed in this encounter.  Medication changes: No orders of the defined types were  placed in this encounter.   Signed, Park Liter, MD, [facility] 10/29/2020 9:51 AM    [place]

## 2020-10-30 LAB — LIPID PANEL
Chol/HDL Ratio: 8.3 ratio — ABNORMAL HIGH (ref 0.0–5.0)
Cholesterol, Total: 231 mg/dL — ABNORMAL HIGH (ref 100–199)
HDL: 28 mg/dL — ABNORMAL LOW (ref >39)
LDL Chol Calc (NIH): 166 mg/dL — ABNORMAL HIGH (ref 0–99)
Triglycerides: 199 mg/dL — ABNORMAL HIGH (ref 0–149)
VLDL Cholesterol Cal: 37 mg/dL (ref 5–40)

## 2020-11-05 NOTE — Addendum Note (Signed)
Addended by: Senaida Ores on: 11/05/2020 10:36 AM   Modules accepted: Orders

## 2020-11-05 NOTE — Addendum Note (Signed)
Addended by: Jenne Campus on: 11/05/2020 10:39 AM   Modules accepted: Orders

## 2020-11-12 ENCOUNTER — Telehealth: Payer: Self-pay | Admitting: Cardiology

## 2020-11-12 ENCOUNTER — Other Ambulatory Visit: Payer: Self-pay

## 2020-11-12 ENCOUNTER — Ambulatory Visit (INDEPENDENT_AMBULATORY_CARE_PROVIDER_SITE_OTHER): Payer: PRIVATE HEALTH INSURANCE

## 2020-11-12 DIAGNOSIS — R002 Palpitations: Secondary | ICD-10-CM | POA: Diagnosis not present

## 2020-11-12 DIAGNOSIS — R0789 Other chest pain: Secondary | ICD-10-CM | POA: Diagnosis not present

## 2020-11-12 DIAGNOSIS — E785 Hyperlipidemia, unspecified: Secondary | ICD-10-CM | POA: Diagnosis not present

## 2020-11-12 LAB — EXERCISE TOLERANCE TEST
Estimated workload: 7 METS
Exercise duration (min): 6 min
Exercise duration (sec): 0 s
MPHR: 173 {beats}/min
Peak HR: 153 {beats}/min
Percent HR: 88 %
RPE: 15
Rest HR: 71 {beats}/min

## 2020-11-12 NOTE — Telephone Encounter (Signed)
Called patient informed him of results.

## 2020-11-12 NOTE — Telephone Encounter (Signed)
Pt is returning call in regards to results. Please advise

## 2020-11-14 ENCOUNTER — Telehealth: Payer: Self-pay

## 2020-11-14 NOTE — Telephone Encounter (Signed)
Patient notified of results and recommendations.

## 2020-11-14 NOTE — Telephone Encounter (Signed)
-----   Message from Park Liter, MD sent at 11/13/2020  9:23 PM EDT ----- Stress test show nondiagnostic changes we will talk to the next visit about potentially doing different modality of testing

## 2020-11-21 ENCOUNTER — Observation Stay (HOSPITAL_BASED_OUTPATIENT_CLINIC_OR_DEPARTMENT_OTHER)
Admission: EM | Admit: 2020-11-21 | Discharge: 2020-11-22 | Disposition: A | Discharge status: Home / Self Care | Payer: No Typology Code available for payment source | Attending: Cardiovascular Disease | Admitting: Cardiovascular Disease

## 2020-11-21 ENCOUNTER — Encounter: Payer: Self-pay | Admitting: Osteopathic Medicine

## 2020-11-21 ENCOUNTER — Observation Stay (HOSPITAL_BASED_OUTPATIENT_CLINIC_OR_DEPARTMENT_OTHER): Payer: No Typology Code available for payment source

## 2020-11-21 ENCOUNTER — Encounter (HOSPITAL_BASED_OUTPATIENT_CLINIC_OR_DEPARTMENT_OTHER): Payer: Self-pay | Admitting: Emergency Medicine

## 2020-11-21 ENCOUNTER — Other Ambulatory Visit: Payer: Self-pay

## 2020-11-21 ENCOUNTER — Emergency Department (HOSPITAL_BASED_OUTPATIENT_CLINIC_OR_DEPARTMENT_OTHER): Payer: No Typology Code available for payment source

## 2020-11-21 ENCOUNTER — Encounter (HOSPITAL_COMMUNITY)
Admission: EM | Disposition: A | Discharge status: Home / Self Care | Payer: Self-pay | Source: Home / Self Care | Attending: Cardiovascular Disease

## 2020-11-21 DIAGNOSIS — I200 Unstable angina: Secondary | ICD-10-CM | POA: Diagnosis present

## 2020-11-21 DIAGNOSIS — R0609 Other forms of dyspnea: Secondary | ICD-10-CM

## 2020-11-21 DIAGNOSIS — Z7982 Long term (current) use of aspirin: Secondary | ICD-10-CM | POA: Diagnosis not present

## 2020-11-21 DIAGNOSIS — R079 Chest pain, unspecified: Secondary | ICD-10-CM | POA: Diagnosis not present

## 2020-11-21 DIAGNOSIS — I10 Essential (primary) hypertension: Secondary | ICD-10-CM | POA: Insufficient documentation

## 2020-11-21 DIAGNOSIS — R0602 Shortness of breath: Secondary | ICD-10-CM

## 2020-11-21 DIAGNOSIS — Z20822 Contact with and (suspected) exposure to covid-19: Secondary | ICD-10-CM | POA: Diagnosis not present

## 2020-11-21 DIAGNOSIS — R0600 Dyspnea, unspecified: Secondary | ICD-10-CM | POA: Diagnosis not present

## 2020-11-21 DIAGNOSIS — R0789 Other chest pain: Secondary | ICD-10-CM | POA: Diagnosis present

## 2020-11-21 LAB — CBC WITH DIFFERENTIAL/PLATELET
Abs Immature Granulocytes: 0.03 10*3/uL (ref 0.00–0.07)
Basophils Absolute: 0.1 10*3/uL (ref 0.0–0.1)
Basophils Relative: 1 %
Eosinophils Absolute: 0.2 10*3/uL (ref 0.0–0.5)
Eosinophils Relative: 3 %
HCT: 42.5 % (ref 39.0–52.0)
Hemoglobin: 14.3 g/dL (ref 13.0–17.0)
Immature Granulocytes: 0 %
Lymphocytes Relative: 35 %
Lymphs Abs: 2.7 10*3/uL (ref 0.7–4.0)
MCH: 29.2 pg (ref 26.0–34.0)
MCHC: 33.6 g/dL (ref 30.0–36.0)
MCV: 86.7 fL (ref 80.0–100.0)
Monocytes Absolute: 0.7 10*3/uL (ref 0.1–1.0)
Monocytes Relative: 10 %
Neutro Abs: 4 10*3/uL (ref 1.7–7.7)
Neutrophils Relative %: 51 %
Platelets: 290 10*3/uL (ref 150–400)
RBC: 4.9 MIL/uL (ref 4.22–5.81)
RDW: 13.1 % (ref 11.5–15.5)
WBC: 7.7 10*3/uL (ref 4.0–10.5)
nRBC: 0 % (ref 0.0–0.2)

## 2020-11-21 LAB — BASIC METABOLIC PANEL
Anion gap: 7 (ref 5–15)
BUN: 12 mg/dL (ref 6–20)
CO2: 28 mmol/L (ref 22–32)
Calcium: 9.3 mg/dL (ref 8.9–10.3)
Chloride: 102 mmol/L (ref 98–111)
Creatinine, Ser: 0.86 mg/dL (ref 0.61–1.24)
GFR, Estimated: >60 mL/min (ref >60)
Glucose, Bld: 132 mg/dL — ABNORMAL HIGH (ref 70–99)
Potassium: 4.5 mmol/L (ref 3.5–5.1)
Sodium: 137 mmol/L (ref 135–145)

## 2020-11-21 LAB — CREATININE, SERUM
Creatinine, Ser: 0.88 mg/dL (ref 0.61–1.24)
GFR, Estimated: >60 mL/min (ref >60)

## 2020-11-21 LAB — ECHOCARDIOGRAM COMPLETE
Area-P 1/2: 3.26 cm2
Height: 72 in
S' Lateral: 2.7 cm
Weight: 4614.4 oz

## 2020-11-21 LAB — CBC
HCT: 43.6 % (ref 39.0–52.0)
Hemoglobin: 14 g/dL (ref 13.0–17.0)
MCH: 28.2 pg (ref 26.0–34.0)
MCHC: 32.1 g/dL (ref 30.0–36.0)
MCV: 87.7 fL (ref 80.0–100.0)
Platelets: 298 10*3/uL (ref 150–400)
RBC: 4.97 MIL/uL (ref 4.22–5.81)
RDW: 13.2 % (ref 11.5–15.5)
WBC: 10.1 10*3/uL (ref 4.0–10.5)
nRBC: 0 % (ref 0.0–0.2)

## 2020-11-21 LAB — TROPONIN I (HIGH SENSITIVITY)
Troponin I (High Sensitivity): 4 ng/L (ref <18)
Troponin I (High Sensitivity): 4 ng/L (ref <18)

## 2020-11-21 LAB — RESP PANEL BY RT-PCR (FLU A&B, COVID) ARPGX2
Influenza A by PCR: NEGATIVE
Influenza B by PCR: NEGATIVE
SARS Coronavirus 2 by RT PCR: NEGATIVE

## 2020-11-21 LAB — HIV ANTIBODY (ROUTINE TESTING W REFLEX): HIV Screen 4th Generation wRfx: NONREACTIVE

## 2020-11-21 LAB — D-DIMER, QUANTITATIVE: D-Dimer, Quant: 0.4 ug/mL-FEU (ref 0.00–0.50)

## 2020-11-21 IMAGING — DX DG CHEST 1V PORT
1 series · 1 of 1 positions shown · non-contrast
Comparison: [DATE]

CLINICAL DATA: Chest pain and shortness of breath

EXAM:
PORTABLE CHEST 1 VIEW

[chest ap]
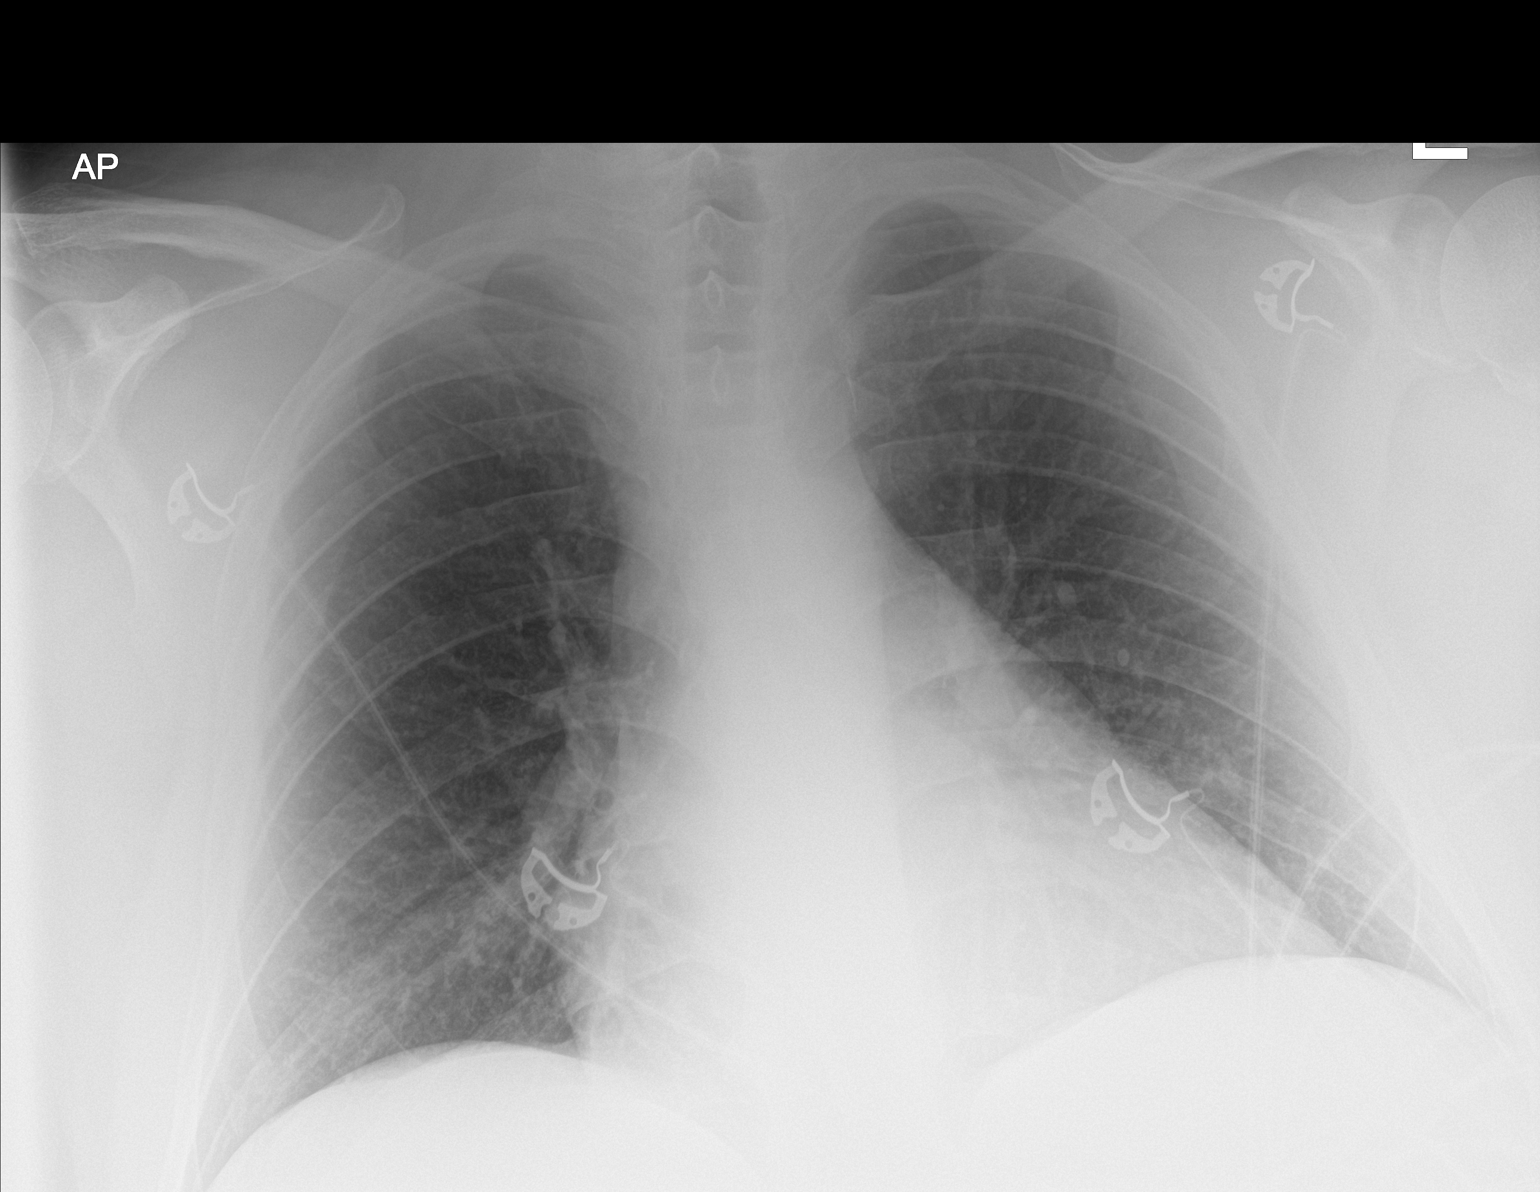

[1 of 1 positions shown; findings below may reference images not displayed]

FINDINGS: There is cardiac enlargement pulmonary vascularity normal. Lungs
clear. No adenopathy no bone lesions. No pneumothorax.
IMPRESSION: Lungs clear.  Heart mildly enlarged, stable.  No adenopathy.

## 2020-11-21 SURGERY — LEFT HEART CATH AND CORONARY ANGIOGRAPHY
Anesthesia: LOCAL

## 2020-11-21 MED ORDER — PERFLUTREN LIPID MICROSPHERE
INTRAVENOUS | Status: AC
  Administered 2020-11-21: 3 mL via INTRAVENOUS
  Filled 2020-11-21: qty 10

## 2020-11-21 MED ORDER — ACETAMINOPHEN 325 MG PO TABS: 650.0000 mg | ORAL_TABLET | ORAL | Status: DC | PRN

## 2020-11-21 MED ORDER — CARVEDILOL 3.125 MG PO TABS
3.1250 mg | ORAL_TABLET | Freq: Two times a day (BID) | ORAL | Status: DC
  Administered 2020-11-22: 3.125 mg via ORAL
  Filled 2020-11-21: qty 1

## 2020-11-21 MED ORDER — ONDANSETRON HCL 4 MG/2ML IJ SOLN: 4.0000 mg | Freq: Four times a day (QID) | INTRAMUSCULAR | Status: DC | PRN

## 2020-11-21 MED ORDER — SODIUM CHLORIDE 0.9% FLUSH
3.0000 mL | Freq: Two times a day (BID) | INTRAVENOUS | Status: DC
  Administered 2020-11-21 – 2020-11-22 (×2): 3 mL via INTRAVENOUS

## 2020-11-21 MED ORDER — HEPARIN SODIUM (PORCINE) 5000 UNIT/ML IJ SOLN
5000.0000 [IU] | Freq: Three times a day (TID) | INTRAMUSCULAR | Status: DC
Start: 2020-11-21 — End: 2020-11-22
  Administered 2020-11-21: 5000 [IU] via SUBCUTANEOUS
  Filled 2020-11-21: qty 1

## 2020-11-21 MED ORDER — ASPIRIN EC 81 MG PO TBEC: 81.0000 mg | DELAYED_RELEASE_TABLET | Freq: Every day | ORAL | Status: DC

## 2020-11-21 MED ORDER — PERFLUTREN LIPID MICROSPHERE
1.0000 mL | INTRAVENOUS | Status: AC | PRN
  Filled 2020-11-21: qty 10

## 2020-11-21 MED ORDER — ASPIRIN 81 MG PO CHEW
81.0000 mg | CHEWABLE_TABLET | ORAL | Status: AC
  Administered 2020-11-22: 81 mg via ORAL
  Filled 2020-11-21: qty 1

## 2020-11-21 MED ORDER — NITROGLYCERIN 0.4 MG SL SUBL: 0.4000 mg | SUBLINGUAL_TABLET | SUBLINGUAL | Status: DC | PRN

## 2020-11-21 MED ORDER — SODIUM CHLORIDE 0.9 % WEIGHT BASED INFUSION
3.0000 mL/kg/h | INTRAVENOUS | Status: DC
  Administered 2020-11-21: 3 mL/kg/h via INTRAVENOUS

## 2020-11-21 MED ORDER — SODIUM CHLORIDE 0.9 % WEIGHT BASED INFUSION: 1.0000 mL/kg/h | INTRAVENOUS | Status: DC

## 2020-11-21 MED ORDER — ATORVASTATIN CALCIUM 40 MG PO TABS
40.0000 mg | ORAL_TABLET | Freq: Every day | ORAL | Status: DC
  Administered 2020-11-21 – 2020-11-22 (×2): 40 mg via ORAL
  Filled 2020-11-21 (×2): qty 1

## 2020-11-21 MED ORDER — ALBUTEROL SULFATE (2.5 MG/3ML) 0.083% IN NEBU
5.0000 mg | INHALATION_SOLUTION | Freq: Once | RESPIRATORY_TRACT | Status: AC
  Administered 2020-11-21: 5 mg via RESPIRATORY_TRACT
  Filled 2020-11-21: qty 6

## 2020-11-21 NOTE — ED Notes (Signed)
Telephone report call to [facility] with Joshua Brady.

## 2020-11-21 NOTE — ED Notes (Signed)
ED Provider at bedside. 

## 2020-11-21 NOTE — H&P (View-Only) (Signed)
Patient ID: Joshua Brady, male   DOB: 03/20/1976, 45 y.o.   MRN: [id number] Review of echo shows AV sclerosis with no significant valve disease No pericardial effusion and normal RV/LV function EF 55-60%   Jenkins Rouge MD [facility]

## 2020-11-21 NOTE — Plan of Care (Signed)
  Problem: Education: Goal: Knowledge of General Education information will improve Description: Including pain rating scale, medication(s)/side effects and non-pharmacologic comfort measures Outcome: Progressing   Problem: Health Behavior/Discharge Planning: Goal: Ability to manage health-related needs will improve Outcome: Progressing   Problem: Clinical Measurements: Goal: Cardiovascular complication will be avoided Outcome: Progressing   Problem: Pain Managment: Goal: General experience of comfort will improve Outcome: Progressing   Problem: Activity: Goal: Ability to return to baseline activity level will improve Outcome: Progressing

## 2020-11-21 NOTE — Progress Notes (Signed)
Echocardiogram 2D Echocardiogram has been performed.  Joshua Brady 11/21/2020, 4:24 PM Dr. Johnsie Cancel notified

## 2020-11-21 NOTE — ED Notes (Signed)
Telephone report called to Judson Roch, RN on 6E at [facility].

## 2020-11-21 NOTE — H&P (Addendum)
Cardiology Admission History and Physical:   Patient ID: Joshua Brady MRN: [id number]; DOB: Jun 06, 1976   Admission date: 11/21/2020  PCP:  Emeterio Reeve, [place] [facility] Cardiologist:  Jenne Campus, MD   {   Chief Complaint:  Chest pain   Patient Profile:   Joshua Brady is a 45 y.o. male with history of borderline high blood pressure and high cholesterol who is being seen 11/21/2020 for the evaluation of chest discomfort.  Establish care with Dr. Agustin Cree in 2019 for palpitation.  Follow-up echocardiogram showed normal LV function without significant valvular abnormality. Monitor showed normal sinus rhythm with infrequent PVCs and PACs.  History of Present Illness:   Joshua Brady is dealing with waxing and waning chest discomfort and shortness of breath for past 3 months.  Current with activity, now with rest as well.  Seen by Dr. Agustin Cree last month and underwent exercise tolerance test showing hypertensive response to exercise and ST depression at peak.  Recommended Myoview study to rule out ischemia.  Symptoms persistently got worse.  He had a worse episode last night prior to going to bed.  Described as "cold sensation" across his chest with severe shortness of breath.  He eventually fall asleep and woke up this morning with chest discomfort.  He went to work and symptoms exacerbated and came to [employer] for further evaluation.  He works as a Geologist, engineering at [employer].  Sometimes he needs to stop walking due to chest discomfort.  He also does woodwork and reported chest discomfort with it.  He can tell that his symptoms has worsened in past 3 months.  With his chest discomfort sometimes he feels like he is going to pass out with diaphoresis and dizziness.  Patient previously had study done and found to have borderline sleep apnea but did not required CPAP.  Denies history of tobacco smoking or alcohol abuse.  Currently reporting mild  chest discomfort and hard to take deep breath.  High-sensitivity troponin negative x2.  Creatinine 0.86.  Potassium 4.5.  Hemoglobin 14.3.  D-dimer negative.  Respiratory panel negative for influenza and COVID.  Chest x-ray unremarkable.  ETT 11/12/2020 Blood pressure demonstrated a hypertensive response to exercise. Patient walked on a standard Bruce protocol treadmill test for 6 minutes 7 seconds. He achieved a peak heart rate of 153 which is 88% predicted maximal heart rate. He had a markedly hypertensive response to exercise. Given patient nonspecific ST changes. With exercise he developed approximate 2 mm of ST segment depression. These changes resolved fairly quickly during recovery phase. This is interpreted as a nondiagnostic exercise test due to baseline ST and T wave changes. He did develop fairly significant ST depression at peak exercise but this is likely associated with his marked hypertension . Suggest [place] study for further evaluation to rule out ischemia.   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 Authorizing Provider  aspirin EC 81 MG tablet Take 1 tablet (81 mg total) by mouth daily. Swallow whole. 10/29/20  Yes Park Liter, MD      Allergies:    Allergies  Allergen Reactions   Other     Ants - see notes from ER visit 02/2018   Prednisone    Topiramate     Anxiety, dizzy, depressed, suicidal thoughts, agitated    Social History:   Social History   Socioeconomic History   Marital status: Married    Spouse name: Not on file   Number of children: Not on file   Years of education: Not on file   Highest education level: Not on file  Occupational History   Not on file  Tobacco Use   Smoking status: Never   Smokeless tobacco: Never  Vaping Use   Vaping Use: Never used  Substance and Sexual Activity   Alcohol use: Yes   Drug use: No   Sexual activity: Yes    Birth control/protection: None  Other Topics Concern   Not on file  Social History Narrative   Not on file   Social Determinants of Health   Financial Resource Strain: Not on file  Food Insecurity: Not on file  Transportation Needs: Not on file  Physical Activity: Not on file  Stress: Not on file  Social Connections: Not on file  Intimate Partner Violence: Not on file    Family History:   The patient's family history includes Alcohol abuse in his father. There is no history of Other.    ROS:  Please see the history of present illness.  All other ROS reviewed and negative.     Physical Exam/Data:   Vitals:   11/21/20 1030 11/21/20 1100 11/21/20 1200 11/21/20 1410  BP: 136/78 (!) 146/83 127/86 (!) 146/96  Pulse: 69 72 65 65  Resp: 15 11 13 14   Temp:    97.9 F (36.6 C)  TempSrc:    Oral  SpO2: 97% 97% 99% 98%  Weight:    130.8 kg  Height:    6' (1.829 m)   No intake or output data in the 24 hours ending 11/21/20 1443 Last 3 Weights 11/21/2020 11/21/2020 10/29/2020  Weight (lbs) 288 lb 6.4 oz 280 lb 283 lb  Weight (kg) 130.817 kg 127.007 kg 128.368 kg     Body mass index is 39.11 kg/m.  General:  Obese male in no acute distress HEENT: normal Lymph: no adenopathy Neck: no JVD Endocrine:  No thryomegaly Vascular: No carotid  bruits; FA pulses 2+ bilaterally without bruits  Cardiac:  normal S1, S2; RRR; no murmur Lungs:  clear to auscultation bilaterally, no wheezing, rhonchi or rales  Abd: soft, nontender, no hepatomegaly  Ext: no edema Musculoskeletal:  No deformities, BUE and BLE strength normal and equal Skin: warm and dry  Neuro:  CNs 2-12 intact, no focal abnormalities noted Psych:  Normal affect    EKG:  The ECG that was done today was personally reviewed and demonstrates NSR  Relevant CV Studies:  Echo 02/2018 Study Conclusions   - Left ventricle: The cavity size was normal. Systolic function was    normal. The estimated ejection fraction was in the range of 55%    to 60%. Wall  motion was normal; there were no regional wall    motion abnormalities. Left ventricular diastolic function    parameters were normal.   Impressions:   - Grossly normal echocardiogram with trace TR - physilogic.   Monitor 03/2018 Baseline rhythm: Sinus rhythm   Minimum heart rate: 45 BPM.  Average heart rate: 81 BPM.  Maximal heart rate 167 BPM.   Atrial arrhythmia: Infrequent premature supraventricular beats with one run of narrow complex tachycardia total of 5 beats only at rate of 145   Ventricular arrhythmia: Infrequent premature ventricular beats   Conduction abnormality: None   Symptoms: None     Conclusion:  Normal sinus rhythm with infrequent PVCs and PACs. One run of narrow complex tachycardia but total of 5 beats only  Laboratory Data:  High Sensitivity Troponin:   Recent Labs  Lab 11/21/20 0815 11/21/20 1025  TROPONINIHS 4 4      Chemistry Recent Labs  Lab 11/21/20 0815  NA 137  K 4.5  CL 102  CO2 28  GLUCOSE 132*  BUN 12  CREATININE 0.86  CALCIUM 9.3  GFRNONAA >60  ANIONGAP 7   Hematology Recent Labs  Lab 11/21/20 0815  WBC 7.7  RBC 4.90  HGB 14.3  HCT 42.5  MCV 86.7  MCH 29.2  MCHC 33.6  RDW 13.1  PLT 290    DDimer  Recent Labs  Lab 11/21/20 0815  DDIMER  0.40    Radiology/Studies:  DG Chest Port 1 View  Result Date: 11/21/2020 CLINICAL DATA:  Chest pain and shortness of breath EXAM: PORTABLE CHEST 1 VIEW COMPARISON:  September 10, 2020 FINDINGS: There is cardiac enlargement pulmonary vascularity normal. Lungs clear. No adenopathy no bone lesions. No pneumothorax. IMPRESSION: Lungs clear.  Heart mildly enlarged, stable.  No adenopathy. Electronically Signed   By: Lowella Grip III M.D.   On: 11/21/2020 08:37     Assessment and Plan:   Chest discomfort Mixed 2 types of symptoms.  His chest discomfort episode concerning for unstable angina.  Also has chest wall pain which is reproducible with palpation.  Risk factor includes obesity and borderline high blood pressure and cholesterol.  Recent exercise tolerance test with hypertensive response and ST depression at peak.  Troponin negative.  D-dimer negative.  Plan stat echocardiogram and cardiac catheterization afterwards. The patient understands that risks include but are not limited to stroke (1 in 1000), death (1 in 58), kidney failure [usually temporary] (1 in 500), bleeding (1 in 200), allergic reaction [possibly serious] (1 in 200), and agrees to proceed.     2. Elevated Blood pressure -Blood pressure intermittently elevated. -We will add coreg 3.125mg  BID  3. HLD - 10/29/2020: Cholesterol, Total 231; HDL 28; LDL Chol Calc (NIH) 166; Triglycerides 199  - Start Lipitor 40mg  qd  Risk Assessment/Risk Scores:   HEAR Score (for undifferentiated chest pain):  HEAR Score: 3{   Severity of Illness: The appropriate patient status for this patient is OBSERVATION. Observation status is judged to be reasonable and necessary in order to provide the required intensity of service to ensure the patient's safety. The patient's presenting symptoms, physical exam findings, and initial radiographic and laboratory data in the context of their medical condition is felt to place them at decreased risk for  further clinical deterioration. Furthermore, it is anticipated that the patient will be medically stable for discharge from the hospital within 2 midnights of admission. The following factors support the patient status of observation.   " The patient's presenting  symptoms include chest discomfort, shortness of breath, diaphoresis. " The physical exam findings include none " The initial radiographic and laboratory data are none  For questions or updates, please contact [place] Please consult [email or web address] for contact info under     Signed, Leanor Kail, [place]  11/21/2020 2:43 PM    Patient examined chart reviewed discussed care with patient and PA Exam with overweight white male in no distress lungs clear no murmur abdomen soft No edema good pulses ECG non acute Reviewed his ETT and he had 2 mm ST depression in lateral leads. 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 Given his size favor diagnostic cath and echo to find etiology of chest pain and dyspnea. Risks of cath including contrast reaction Stroke MI, bleeding need for surgery discussed willing to proceed. Cath lab indicates can likely do this afternoon  Jenkins Rouge MD [facility]

## 2020-11-21 NOTE — ED Provider Notes (Signed)
[facility] EMERGENCY DEPARTMENT Provider Note   CSN: [id number] Arrival date & time: 11/21/20  1761     History Chief Complaint  Patient presents with   Shortness of Breath    Juventino Pavone is a 45 y.o. male.  Patient is a 45 year old male with a history of borderline high cholesterol, chest pain and shortness of breath for the last few months, sleep apnea that has not required CPAP in the past who is presenting today with persistent chest pain and shortness of breath.  Patient has followed up with his PCP and cardiology and had a stress test done within the last month which showed some nondiagnostic changes and he is planning on following up with cardiology in July for possibly more testing.  He has seen his PCP as well and at this time she wanted to wait to do any further treatment until the heart was ruled out.  He states the pain is now been present fairly constantly.  Today he came in because he was just tired of dealing with the symptoms.  He reports that as a cold tight sensation in the center of his chest and throat which is worse with taking a deep breath.  He notes he feels breathless all the time but is most significant with exertion.  He is able to lay down in bed and sleep on his back or side and fall asleep quite quickly.  He does have an intermittent dry cough and does complain of mucus and pressure in his face but no fevers.  He has not had any distal edema and no unilateral leg pain or swelling.  He has had no prior history of blood clots and no family history.  No significant family history of cardiac disease.  He takes an 81 mg aspirin but no other medications.  He has not had any medication changes in the last few months prior to the symptoms starting.  He occasionally will have some discomfort in his bilateral shoulders along with the discomfort in his chest.  The history is provided by the patient and medical records.  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   Low testosterone 04/29/2018   Lumbar degenerative disc disease 02/06/2020   Palpitations 03/03/2018   Sleep apnea 03/11/2018   SOB (shortness of breath) on exertion 03/03/2018   Viral illness 09/11/2019    Patient Active Problem List   Diagnosis Date Noted   Atypical chest pain 10/29/2020   Heart murmur    Lumbar degenerative disc disease 02/06/2020   Viral illness 09/11/2019   Low testosterone 04/29/2018   Sleep apnea 03/11/2018   Daytime somnolence 03/11/2018   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   SOB (shortness of breath) on exertion 03/03/2018   Palpitations 03/03/2018   Borderline high cholesterol 01/25/2017   Dyslipidemia 01/25/2017   History of brain concussion 01/21/2017   History of low back pain 01/21/2017   Head injury 2016    Past Surgical History:  Procedure Laterality Date   Broken Arm     LIVER BIOPSY         Family  History  Problem Relation Age of Onset   Alcohol abuse Father    Other Neg Hx        low testosterone    Social History   Tobacco Use   Smoking status: Never   Smokeless tobacco: Never  Vaping Use   Vaping Use: Never used  Substance Use Topics   Alcohol use: Yes   Drug use: No    Home Medications Prior to Admission medications   Medication Sig Start Date End Date Taking? Authorizing Provider  aspirin EC 81 MG tablet Take 1 tablet (81 mg total) by mouth daily. Swallow whole. 10/29/20  Yes Park Liter, MD    Allergies    Other, Prednisone, and Topiramate  Review of Systems   Review of Systems  Respiratory:  Positive for shortness of breath.   All other systems reviewed  and are negative.  Physical Exam Updated Vital Signs BP 127/86   Pulse 70   Temp 97.7 F (36.5 C) (Oral)   Resp 15   Ht 6' (1.829 m)   Wt 127 kg   SpO2 96%   BMI 37.97 kg/m   Physical Exam Vitals and nursing note reviewed.  Constitutional:      General: He is not in acute distress.    Appearance: Normal appearance. He is well-developed.  HENT:     Head: Normocephalic and atraumatic.     Right Ear: Tympanic membrane normal.     Left Ear: Tympanic membrane normal.     Nose:     Comments: Mucosal edema Eyes:     Extraocular Movements: Extraocular movements intact.     Conjunctiva/sclera: Conjunctivae normal.     Pupils: Pupils are equal, round, and reactive to light.  Cardiovascular:     Rate and Rhythm: Normal rate and regular rhythm.     Pulses: Normal pulses.     Heart sounds: No murmur heard. Pulmonary:     Effort: Pulmonary effort is normal. No respiratory distress.     Breath sounds: Examination of the right-lower field reveals decreased breath sounds. Examination of the left-lower field reveals decreased breath sounds. Decreased breath sounds present. No wheezing or rales.     Comments: No wheezing with forced expiration Abdominal:     General: There is no distension.     Palpations: Abdomen is soft.     Tenderness: There is no abdominal tenderness. There is no guarding or rebound.  Musculoskeletal:        General: No tenderness. Normal range of motion.     Cervical back: Normal range of motion and neck supple.     Right lower leg: No edema.     Left lower leg: No edema.  Skin:    General: Skin is warm and dry.     Capillary Refill: Capillary refill takes less than 2 seconds.     Findings: Rash present. No erythema.     Comments: Dry scaling rash over the bilateral palms and between the fingers  Neurological:     General: No focal deficit present.     Mental Status: He is alert and oriented to person, place, and time.  Psychiatric:        Mood and Affect:  Mood normal.        Behavior: Behavior normal.    ED Results / Procedures / Treatments   Labs (all labs ordered are listed, but only abnormal results are displayed) Labs Reviewed  BASIC METABOLIC PANEL - Abnormal; Notable for the following components:  Result Value   Glucose, Bld 132 (*)    All other components within normal limits  CBC WITH DIFFERENTIAL/PLATELET  D-DIMER, QUANTITATIVE  TROPONIN I (HIGH SENSITIVITY)    EKG EKG Interpretation  Date/Time:  Thursday November 21 2020 07:28:49 EDT Ventricular Rate:  71 PR Interval:  160 QRS Duration: 89 QT Interval:  375 QTC Calculation: 408 R Axis:   55 Text Interpretation: Sinus rhythm No significant change since last tracing Confirmed by Blanchie Dessert ([id number]) on 11/21/2020 8:37:25 AM  Radiology DG Chest Port 1 View  Result Date: 11/21/2020 CLINICAL DATA:  Chest pain and shortness of breath EXAM: PORTABLE CHEST 1 VIEW COMPARISON:  September 10, 2020 FINDINGS: There is cardiac enlargement pulmonary vascularity normal. Lungs clear. No adenopathy no bone lesions. No pneumothorax. IMPRESSION: Lungs clear.  Heart mildly enlarged, stable.  No adenopathy. Electronically Signed   By: Lowella Grip III M.D.   On: 11/21/2020 08:37    Procedures Procedures   Medications Ordered in ED Medications  albuterol (PROVENTIL) (2.5 MG/3ML) 0.083% nebulizer solution 5 mg (has no administration in time range)    ED Course  I have reviewed the triage vital signs and the nursing notes.  Pertinent labs & imaging results that were available during my care of the patient were reviewed by me and considered in my medical decision making (see chart for details).    MDM Rules/Calculators/A&P                          Patient is a 45 year old male presenting today with nonspecific chest pain and shortness of breath that is now been present for several months.  He has followed up with his PCP and cardiology.  Stress test done less than 2 weeks ago  which showed a nondiagnostic exercise test due to baseline ST and T wave changes with exertion.  A lexicon Myoview test was recommended to rule out ischemia.  Patient reports that since this time he has had ongoing chest pain and shortness of breath.  The shortness of breath is worse with exertion and he does feel it between his shoulder blades at times.  The pain he describes as an uncomfortable sensation in the center of his chest that does seem a bit worse with taking a deep breath.  He has had some minimal coughing.  His provider had given her him a steroid inhaler which he did use 1 time last night but did not notice any difference.  He is taking it 81 mg aspirin.  He denies any orthopnea or palpitations.  Symptoms do not seem to be worse with eating.  Patient's EKG today is within normal limits.  We will repeat labs.  Concern for possible lung pathology versus atypical ACS.  Patient's vital signs are normal today and oxygen saturation is 96% on room air.  Low suspicion for dissection, pericarditis, pericardial effusion.  No change in voice or reason to believe there is upper airway obstruction.  He does have diminished breath sounds bilaterally at the bases but could be related to body habitus.  9:11 AM LAbs are all wnl including CBC, BMP, d-dimer and trop.  Albuterol did NOT improve pt's symptoms.  CXR wnl.  Will discuss with cardiology given pt's ongoing sx that are worsening and abnormal stress test.  Spoke with Dr. Johnsie Cancel who would like to admit the pt for further work up given abnormal stress and ongoing sx.  MDM   Amount and/or Complexity of Data  Reviewed Clinical lab tests: ordered and reviewed Tests in the radiology section of CPT: ordered and reviewed Tests in the medicine section of CPT: ordered and reviewed Decide to obtain previous medical records or to obtain history from someone other than the patient: yes Obtain history from someone other than the patient: yes Review and summarize  past medical records: yes Discuss the patient with other providers: yes Independent visualization of images, tracings, or specimens: yes  Risk of Complications, Morbidity, and/or Mortality Presenting problems: moderate Diagnostic procedures: moderate Management options: low  Patient Progress Patient progress: stable    Final Clinical Impression(s) / ED Diagnoses Final diagnoses:  Shortness of breath  Chest pain, unspecified type    Rx / DC Orders ED Discharge Orders     None        Blanchie Dessert, MD 11/21/20 [phone]

## 2020-11-21 NOTE — Progress Notes (Signed)
Patient ID: Joshua Brady, male   DOB: [birth year], 45 y.o.   MRN: [id number] Review of echo shows AV sclerosis with no significant valve disease No pericardial effusion and normal RV/LV function EF 55-60%   Jenkins Rouge MD [facility]

## 2020-11-21 NOTE — ED Triage Notes (Signed)
States has been having chest pain, SOB, worse this am

## 2020-11-22 ENCOUNTER — Encounter (HOSPITAL_COMMUNITY)
Admission: EM | Disposition: A | Discharge status: Home / Self Care | Payer: Self-pay | Source: Home / Self Care | Attending: Cardiovascular Disease

## 2020-11-22 ENCOUNTER — Encounter (HOSPITAL_COMMUNITY): Payer: Self-pay | Admitting: Cardiovascular Disease

## 2020-11-22 ENCOUNTER — Other Ambulatory Visit (HOSPITAL_BASED_OUTPATIENT_CLINIC_OR_DEPARTMENT_OTHER): Payer: Self-pay

## 2020-11-22 ENCOUNTER — Observation Stay (HOSPITAL_COMMUNITY): Payer: No Typology Code available for payment source

## 2020-11-22 DIAGNOSIS — R0602 Shortness of breath: Secondary | ICD-10-CM | POA: Diagnosis not present

## 2020-11-22 DIAGNOSIS — R0789 Other chest pain: Secondary | ICD-10-CM

## 2020-11-22 DIAGNOSIS — R0600 Dyspnea, unspecified: Secondary | ICD-10-CM | POA: Diagnosis not present

## 2020-11-22 DIAGNOSIS — R079 Chest pain, unspecified: Secondary | ICD-10-CM | POA: Diagnosis not present

## 2020-11-22 HISTORY — PX: LEFT HEART CATH AND CORONARY ANGIOGRAPHY: CATH118249

## 2020-11-22 LAB — BASIC METABOLIC PANEL
Anion gap: 10 (ref 5–15)
BUN: 11 mg/dL (ref 6–20)
CO2: 24 mmol/L (ref 22–32)
Calcium: 9.3 mg/dL (ref 8.9–10.3)
Chloride: 103 mmol/L (ref 98–111)
Creatinine, Ser: 0.94 mg/dL (ref 0.61–1.24)
GFR, Estimated: >60 mL/min (ref >60)
Glucose, Bld: 111 mg/dL — ABNORMAL HIGH (ref 70–99)
Potassium: 4.2 mmol/L (ref 3.5–5.1)
Sodium: 137 mmol/L (ref 135–145)

## 2020-11-22 LAB — CBC
HCT: 40.3 % (ref 39.0–52.0)
Hemoglobin: 13.3 g/dL (ref 13.0–17.0)
MCH: 28.8 pg (ref 26.0–34.0)
MCHC: 33 g/dL (ref 30.0–36.0)
MCV: 87.2 fL (ref 80.0–100.0)
Platelets: 279 10*3/uL (ref 150–400)
RBC: 4.62 MIL/uL (ref 4.22–5.81)
RDW: 13.3 % (ref 11.5–15.5)
WBC: 7.7 10*3/uL (ref 4.0–10.5)
nRBC: 0 % (ref 0.0–0.2)

## 2020-11-22 LAB — HEMOGLOBIN A1C
Hgb A1c MFr Bld: 6.3 % — ABNORMAL HIGH (ref 4.8–5.6)
Mean Plasma Glucose: 134 mg/dL

## 2020-11-22 SURGERY — LEFT HEART CATH AND CORONARY ANGIOGRAPHY
Anesthesia: LOCAL

## 2020-11-22 MED ORDER — CARVEDILOL 6.25 MG PO TABS
6.2500 mg | ORAL_TABLET | Freq: Two times a day (BID) | ORAL | 6 refills | Status: DC
  Filled 2020-11-22: qty 60, 30d supply, fill #0
  Filled 2020-12-13: qty 60, 30d supply, fill #1
  Filled 2021-01-21: qty 60, 30d supply, fill #2
  Filled 2021-04-04: qty 60, 30d supply, fill #3
  Filled 2021-05-14: qty 60, 30d supply, fill #4
  Filled 2021-06-25 – 2021-06-27 (×2): qty 60, 30d supply, fill #5
  Filled 2021-07-25: qty 60, 30d supply, fill #6

## 2020-11-22 MED ORDER — VERAPAMIL HCL 2.5 MG/ML IV SOLN
INTRAVENOUS | Status: DC | PRN
  Administered 2020-11-22: 10 mL via INTRA_ARTERIAL

## 2020-11-22 MED ORDER — HEPARIN SODIUM (PORCINE) 1000 UNIT/ML IJ SOLN
INTRAMUSCULAR | Status: AC
  Filled 2020-11-22: qty 1

## 2020-11-22 MED ORDER — VERAPAMIL HCL 2.5 MG/ML IV SOLN
INTRAVENOUS | Status: DC | PRN
  Administered 2020-11-22: 2 mg via INTRA_ARTERIAL

## 2020-11-22 MED ORDER — SODIUM CHLORIDE 0.9% FLUSH: 3.0000 mL | Freq: Two times a day (BID) | INTRAVENOUS | Status: DC

## 2020-11-22 MED ORDER — MIDAZOLAM HCL 2 MG/2ML IJ SOLN
INTRAMUSCULAR | Status: DC | PRN
  Administered 2020-11-22: 2 mg via INTRAVENOUS

## 2020-11-22 MED ORDER — ATORVASTATIN CALCIUM 40 MG PO TABS
40.0000 mg | ORAL_TABLET | Freq: Every day | ORAL | 6 refills | Status: DC
  Filled 2020-11-22: qty 30, 30d supply, fill #0
  Filled 2020-12-13: qty 30, 30d supply, fill #1
  Filled 2021-01-24: qty 30, 30d supply, fill #2
  Filled 2021-02-21: qty 30, 30d supply, fill #3
  Filled 2021-04-04: qty 30, 30d supply, fill #4
  Filled 2021-05-14: qty 30, 30d supply, fill #5
  Filled 2021-06-25 – 2021-06-27 (×2): qty 30, 30d supply, fill #6

## 2020-11-22 MED ORDER — HEPARIN (PORCINE) IN NACL 1000-0.9 UT/500ML-% IV SOLN
INTRAVENOUS | Status: AC
  Filled 2020-11-22: qty 1000

## 2020-11-22 MED ORDER — LIDOCAINE HCL (PF) 1 % IJ SOLN
INTRAMUSCULAR | Status: DC | PRN
  Administered 2020-11-22: 5 mL

## 2020-11-22 MED ORDER — SODIUM CHLORIDE 0.9% FLUSH: 3.0000 mL | INTRAVENOUS | Status: DC | PRN

## 2020-11-22 MED ORDER — SODIUM CHLORIDE 0.9 % IV SOLN: 250.0000 mL | INTRAVENOUS | Status: DC | PRN

## 2020-11-22 MED ORDER — HEPARIN SODIUM (PORCINE) 1000 UNIT/ML IJ SOLN
INTRAMUSCULAR | Status: DC | PRN
  Administered 2020-11-22: 6500 [IU] via INTRAVENOUS

## 2020-11-22 MED ORDER — ACETAMINOPHEN 325 MG PO TABS: 650.0000 mg | ORAL_TABLET | ORAL | Status: DC | PRN

## 2020-11-22 MED ORDER — ONDANSETRON HCL 4 MG/2ML IJ SOLN: 4.0000 mg | Freq: Four times a day (QID) | INTRAMUSCULAR | Status: DC | PRN

## 2020-11-22 MED ORDER — LABETALOL HCL 5 MG/ML IV SOLN: 10.0000 mg | INTRAVENOUS | Status: DC | PRN

## 2020-11-22 MED ORDER — IOHEXOL 350 MG/ML SOLN
100.0000 mL | Freq: Once | INTRAVENOUS | Status: AC | PRN
  Administered 2020-11-22: 100 mL via INTRAVENOUS

## 2020-11-22 MED ORDER — FENTANYL CITRATE (PF) 100 MCG/2ML IJ SOLN
INTRAMUSCULAR | Status: DC | PRN
  Administered 2020-11-22: 25 ug via INTRAVENOUS

## 2020-11-22 MED ORDER — LIDOCAINE HCL (PF) 1 % IJ SOLN
INTRAMUSCULAR | Status: AC
  Filled 2020-11-22: qty 30

## 2020-11-22 MED ORDER — VERAPAMIL HCL 2.5 MG/ML IV SOLN
INTRAVENOUS | Status: AC
  Filled 2020-11-22: qty 2

## 2020-11-22 MED ORDER — FENTANYL CITRATE (PF) 100 MCG/2ML IJ SOLN
INTRAMUSCULAR | Status: AC
  Filled 2020-11-22: qty 2

## 2020-11-22 MED ORDER — SODIUM CHLORIDE 0.9 % IV SOLN: INTRAVENOUS | Status: AC

## 2020-11-22 MED ORDER — DIAZEPAM 5 MG PO TABS: 5.0000 mg | ORAL_TABLET | Freq: Four times a day (QID) | ORAL | Status: DC | PRN

## 2020-11-22 MED ORDER — IOHEXOL 350 MG/ML SOLN
INTRAVENOUS | Status: DC | PRN
  Administered 2020-11-22: 45 mL via INTRA_ARTERIAL

## 2020-11-22 MED ORDER — HYDRALAZINE HCL 20 MG/ML IJ SOLN: 10.0000 mg | INTRAMUSCULAR | Status: DC | PRN

## 2020-11-22 MED ORDER — MIDAZOLAM HCL 2 MG/2ML IJ SOLN
INTRAMUSCULAR | Status: AC
  Filled 2020-11-22: qty 2

## 2020-11-22 MED ORDER — HEPARIN (PORCINE) IN NACL 1000-0.9 UT/500ML-% IV SOLN
INTRAVENOUS | Status: DC | PRN
Start: 2020-11-22 — End: 2020-11-22
  Administered 2020-11-22 (×2): 500 mL

## 2020-11-22 SURGICAL SUPPLY — 12 items
CATH INFINITI 5 FR JL3.5 (CATHETERS) ×2 IMPLANT
CATH INFINITI JR4 5F (CATHETERS) ×2 IMPLANT
CATH OPTITORQUE TIG 4.0 5F (CATHETERS) ×2 IMPLANT
DEVICE RAD COMP TR BAND LRG (VASCULAR PRODUCTS) ×2 IMPLANT
GLIDESHEATH SLEND SS 6F .021 (SHEATH) ×2 IMPLANT
GUIDEWIRE INQWIRE 1.5J.035X260 (WIRE) ×1 IMPLANT
INQWIRE 1.5J .035X260CM (WIRE) ×2
KIT HEART LEFT (KITS) ×2 IMPLANT
PACK CARDIAC CATHETERIZATION (CUSTOM PROCEDURE TRAY) ×2 IMPLANT
SHEATH PROBE COVER 6X72 (BAG) ×2 IMPLANT
TRANSDUCER W/STOPCOCK (MISCELLANEOUS) ×2 IMPLANT
TUBING CIL FLEX 10 FLL-RA (TUBING) ×2 IMPLANT

## 2020-11-22 NOTE — Discharge Summary (Addendum)
Discharge Summary    Patient ID: Joshua Brady MRN: [id number]; DOB: 28-Feb-1976  Admit date: 11/21/2020 Discharge date: 11/22/2020  PCP:  Emeterio Reeve, [place] Prov[facility] Cardiologist:  Jenne Campus, MD        Discharge Diagnoses    Principal Problem:   Unstable angina [facility]) Active Problems:   Shortness of breath   Chest pain   HTN   HLD   Diagnostic Studies/Procedures    Echo 11/21/20 1. Left ventricular ejection fraction, by estimation, is 60 to 65%. The  left ventricle has normal function. Left ventricular endocardial border  not optimally defined to evaluate regional wall motion. Left ventricular  diastolic parameters are consistent  with Grade II diastolic dysfunction (pseudonormalization).   2. Right ventricular systolic function is normal. The right ventricular  size is normal. Tricuspid regurgitation signal is inadequate for assessing  PA pressure.   3. Left atrial size was mildly dilated.   4. The mitral valve was not well visualized. No evidence of mitral valve  regurgitation. No evidence of mitral stenosis.   5. The aortic valve is tricuspid. Aortic valve regurgitation is not  visualized. Mild aortic valve sclerosis is present, with no evidence of  aortic valve stenosis.   6. The inferior vena cava is normal in size with greater than 50%  respiratory variability, suggesting right atrial pressure of 3 mmHg.   7. Technically difficult study with poor acoustic windows.     LEFT HEART CATH AND CORONARY ANGIOGRAPHY 11/22/20    Conclusion    Dist LM to Prox LAD lesion is 20% stenosed.   No significant coronary obstructive disease with minimal smooth 20% proximal LAD stenosis.   Normal dominant left circumflex coronary artery and nondominant RCA.   LVEDP 18 mmHg.   RECOMMENDATION: Medical therapy.  Suspect chest pain is noncardiac in origin.  Optimal Blood pressure control, and target LDL less than 70.  History of Present Illness      Joshua Brady is a 45 y.o. male with Joshua Brady is a 45 y.o. male with history of borderline high blood pressure and high cholesterol who is being seen 11/21/2020 for the evaluation of chest discomfort.   Establish care with Dr. Agustin Cree in 2019 for palpitation.  Follow-up echocardiogram showed normal LV function without significant valvular abnormality. Monitor showed normal sinus rhythm with infrequent PVCs and PACs.  Joshua Brady is dealing with waxing and waning chest discomfort and shortness of breath for past 3 months.  initially occurred with activity, now with rest as well.  Seen by Dr. Agustin Cree last month and underwent exercise tolerance test showing hypertensive response to exercise and ST depression at peak.  Recommended Myoview study to rule out ischemia.  Symptoms persistently got worse.  He had a worse episode last night prior to going to bed.  Described as "cold sensation" across his chest with severe shortness of breath.  He eventually fall asleep and woke up this morning with chest discomfort.  He went to work and symptoms exacerbated and came to [employer] for further evaluation.  He works as a Geologist, engineering at [employer].  Sometimes he needs to stop walking due to chest discomfort.  He also does woodwork and reported chest discomfort with it.  He can tell that his symptoms has worsened in past 3 months.  With his chest discomfort sometimes he feels like he is going to pass out with diaphoresis and dizziness.   Patient previously had study done and  found to have borderline sleep apnea but did not required CPAP.  Denies history of tobacco smoking or alcohol abuse.  Currently reporting mild chest discomfort and hard to take deep breath.   High-sensitivity troponin negative x2.  Creatinine 0.86.  Potassium 4.5.  Hemoglobin 14.3.  D-dimer negative.  Respiratory panel negative for influenza and COVID.  Chest x-ray unremarkable.  Hospital Course     Consultants:  none   Chest discomfort Mixed 2 types of symptoms.  His chest discomfort episode concerning for unstable angina.  Also has chest wall pain which is reproducible with palpation.  Risk factor includes obesity and borderline high blood pressure and cholesterol.  Recent exercise tolerance test with hypertensive response and ST depression at peak.  Troponin negative.  D-dimer negative. Stat echocardiogram showed normal LV function.  No pericardial effusion.  RV function normal.  Cardiac catheterization showed no significance disease, only 20% LAD stenosis.  Medical therapy recommended.  His chest pain felt noncardiac.  CT angio of chest without pulmonary embolism.  Patient will follow-up with primary care provider for further evaluation.  Advised to take as needed ibuprofen with Pepcid.  Lifestyle modification recommended with weight loss and heart healthy low-carb low-fat diet.       2. Elevated Blood pressure/hypertension -Blood pressure intermittently elevated. -added coreg and titrated to 6.25 mg BID - Follow up with PCP    3. HLD - 10/29/2020: Cholesterol, Total 231; HDL 28; LDL Chol Calc (NIH) 166; Triglycerides 199  - Start Lipitor 40mg  qd -Lipid panel and LFTs in 6 to 8 weeks -LDL goal less than 70 -Exercise and diet control  Did the patient have an acute coronary syndrome (MI, NSTEMI, STEMI, etc) this admission?:  No                               Did the patient have a percutaneous coronary intervention (stent / angioplasty)?:  No.     Discharge Vitals Blood pressure (!) 142/99, pulse 71, temperature 98 F (36.7 C), temperature source Oral, resp. rate 18, height 6' (1.829 m), weight 130.8 kg, SpO2 96 %.  Filed Weights   11/21/20 0730 11/21/20 1410 11/21/20 2158  Weight: 127 kg 130.8 kg 130.8 kg    Labs & Radiologic Studies    CBC Recent Labs    11/21/20 0815 11/21/20 1759 11/22/20 0423  WBC 7.7 10.1 7.7  NEUTROABS 4.0  --   --   HGB 14.3 14.0 13.3  HCT 42.5 43.6 40.3  MCV  86.7 87.7 87.2  PLT 290 298 993   Basic Metabolic Panel Recent Labs    11/21/20 0815 11/21/20 1759 11/22/20 0423  NA 137  --  137  K 4.5  --  4.2  CL 102  --  103  CO2 28  --  24  GLUCOSE 132*  --  111*  BUN 12  --  11  CREATININE 0.86 0.88 0.94  CALCIUM 9.3  --  9.3    High Sensitivity Troponin:   Recent Labs  Lab 11/21/20 0815 11/21/20 1025  TROPONINIHS 4 4    BNP Invalid input(s): POCBNP D-Dimer Recent Labs    11/21/20 0815  DDIMER 0.40    _____________  CT Angio Chest Pulmonary Embolism (PE) W or WO Contrast  Result Date: 11/22/2020 CLINICAL DATA:  Chest pain and shortness of breath.  Pleurisy. EXAM: CT ANGIOGRAPHY CHEST WITH CONTRAST TECHNIQUE: Multidetector CT imaging of the chest was performed  using the standard protocol during bolus administration of intravenous contrast. Multiplanar CT image reconstructions and MIPs were obtained to evaluate the vascular anatomy. CONTRAST:  184mL OMNIPAQUE IOHEXOL 350 MG/ML SOLN COMPARISON:  Chest radiography 11/21/2020. FINDINGS: Cardiovascular: Heart size upper limits of normal. No visible coronary artery calcification. No aortic atherosclerotic calcification. No sign of aortic dissection. Pulmonary arterial opacification is good. No pulmonary emboli. Mediastinum/Nodes: Lobulated abnormal tissue in the left middle mediastinum, adjacent to pericardial margin. Due to the lobular nature, this is difficult to measure precisely, but measures approximately 3 x 4 cm. No abnormal nodes seen in traditional nodal locations. Lungs/Pleura: The lungs are clear. No infiltrate, collapse, mass, nodule or pleural effusion. Upper Abdomen: Cholelithiasis without CT evidence of cholecystitis. Musculoskeletal: Negative Review of the MIP images confirms the above findings. IMPRESSION: No pulmonary emboli or acute vascular pathology. Multilobular densities in the left middle mediastinum, adjacent to the lateral margin of pericardium in the left atrial  appendage. Due to the multilobular nature, difficult to precisely measure but approximately 3 x 4 cm in size. The exact nature is uncertain, but this is quite likely to be benign. Possible diagnoses include unusual pericardial cysts, lymphatic malformation, and unusual ectopic thymus. In any case, I think the likelihood of this being significant is quite low. If anything, 1 could consider a follow-up CT in 3 months to show stability. Electronically Signed   By: Nelson Chimes M.D.   On: 11/22/2020 11:46   CARDIAC CATHETERIZATION  Result Date: 11/22/2020  Dist LM to Prox LAD lesion is 20% stenosed.  No significant coronary obstructive disease with minimal smooth 20% proximal LAD stenosis. Normal dominant left circumflex coronary artery and nondominant RCA. LVEDP 18 mmHg. RECOMMENDATION: Medical therapy.  Suspect chest pain is noncardiac in origin.  Optimal Blood pressure control, and target LDL less than 70.   DG Chest Port 1 View  Result Date: 11/21/2020 CLINICAL DATA:  Chest pain and shortness of breath EXAM: PORTABLE CHEST 1 VIEW COMPARISON:  September 10, 2020 FINDINGS: There is cardiac enlargement pulmonary vascularity normal. Lungs clear. No adenopathy no bone lesions. No pneumothorax. IMPRESSION: Lungs clear.  Heart mildly enlarged, stable.  No adenopathy. Electronically Signed   By: Lowella Grip III M.D.   On: 11/21/2020 08:37   ECHOCARDIOGRAM COMPLETE  Result Date: 11/21/2020    ECHOCARDIOGRAM REPORT   Patient Name:   Joshua Brady Date of Exam: 11/21/2020 Medical Rec #:  [id number]    Height:       72.0 in Accession #:    [id number]   Weight:       288.4 lb Date of Birth:  1976/05/10    BSA:          2.489 m Patient Age:    45 years     BP:           135/93 mmHg Patient Gender: M            HR:           68 bpm. Exam Location:  Inpatient Procedure: 2D Echo, Color Doppler, Cardiac Doppler and Intracardiac            Opacification Agent STAT ECHO Indications:    R06.9 DOE  History:        Patient has  prior history of Echocardiogram examinations, most                 recent 03/11/2018. Risk Factors:Dyslipidemia and Sleep Apnea.  Sonographer:    [facility]  Referring Phys: [id number] [place]  1. Left ventricular ejection fraction, by estimation, is 60 to 65%. The left ventricle has normal function. Left ventricular endocardial border not optimally defined to evaluate regional wall motion. Left ventricular diastolic parameters are consistent with Grade II diastolic dysfunction (pseudonormalization).  2. Right ventricular systolic function is normal. The right ventricular size is normal. Tricuspid regurgitation signal is inadequate for assessing PA pressure.  3. Left atrial size was mildly dilated.  4. The mitral valve was not well visualized. No evidence of mitral valve regurgitation. No evidence of mitral stenosis.  5. The aortic valve is tricuspid. Aortic valve regurgitation is not visualized. Mild aortic valve sclerosis is present, with no evidence of aortic valve stenosis.  6. The inferior vena cava is normal in size with greater than 50% respiratory variability, suggesting right atrial pressure of 3 mmHg.  7. Technically difficult study with poor acoustic windows. FINDINGS  Left Ventricle: Left ventricular ejection fraction, by estimation, is 60 to 65%. The left ventricle has normal function. Left ventricular endocardial border not optimally defined to evaluate regional wall motion. Definity contrast agent was given IV to delineate the left ventricular endocardial borders. The left ventricular internal cavity size was normal in size. There is no left ventricular hypertrophy. Left ventricular diastolic parameters are consistent with Grade II diastolic dysfunction (pseudonormalization). Right Ventricle: The right ventricular size is normal. No increase in right ventricular wall thickness. Right ventricular systolic function is normal. Tricuspid regurgitation signal is inadequate for  assessing PA pressure. Left Atrium: Left atrial size was mildly dilated. Right Atrium: Right atrial size was normal in size. Pericardium: There is no evidence of pericardial effusion. Mitral Valve: The mitral valve was not well visualized. There is mild calcification of the mitral valve leaflet(s). Mild mitral annular calcification. No evidence of mitral valve regurgitation. No evidence of mitral valve stenosis. Tricuspid Valve: The tricuspid valve is normal in structure. Tricuspid valve regurgitation is not demonstrated. Aortic Valve: The aortic valve is tricuspid. Aortic valve regurgitation is not visualized. Mild aortic valve sclerosis is present, with no evidence of aortic valve stenosis. Pulmonic Valve: The pulmonic valve was normal in structure. Pulmonic valve regurgitation is not visualized. Aorta: The aortic root is normal in size and structure. Venous: The inferior vena cava is normal in size with greater than 50% respiratory variability, suggesting right atrial pressure of 3 mmHg. IAS/Shunts: No atrial level shunt detected by color flow Doppler.  LEFT VENTRICLE PLAX 2D LVIDd:         4.20 cm  Diastology LVIDs:         2.70 cm  LV e' medial:    6.31 cm/s LV PW:         1.00 cm  LV E/e' medial:  10.9 LV IVS:        0.90 cm  LV e' lateral:   7.72 cm/s LVOT diam:     2.10 cm  LV E/e' lateral: 8.9 LV SV:         65 LV SV Index:   26 LVOT Area:     3.46 cm  RIGHT VENTRICLE RV S prime:     13.80 cm/s TAPSE (M-mode): 2.5 cm LEFT ATRIUM           Index       RIGHT ATRIUM           Index LA diam:      2.90 cm 1.17 cm/m  RA Area:     15.10 cm LA  Vol [facility]): 45.6 ml 18.32 ml/m RA Volume:   34.70 ml  13.94 ml/m LA Vol (A4C): 90.3 ml 36.28 ml/m  AORTIC VALVE LVOT Vmax:   95.30 cm/s LVOT Vmean:  79.500 cm/s LVOT VTI:    0.187 m  AORTA Ao Root diam: 3.20 cm Ao Asc diam:  3.10 cm MITRAL VALVE MV Area (PHT): 3.26 cm    SHUNTS MV Decel Time: 233 msec    Systemic VTI:  0.19 m MV E velocity: 69.00 cm/s  Systemic Diam:  2.10 cm MV A velocity: 45.80 cm/s MV E/A ratio:  1.51 Loralie Champagne MD Electronically signed by Loralie Champagne MD Signature Date/Time: 11/21/2020/5:03:06 PM    Final    Exercise Tolerance Test  Result Date: 11/12/2020  Blood pressure demonstrated a hypertensive response to exercise.  Patient walked on a standard [place] protocol treadmill test for 6 minutes 7 seconds. He achieved a peak heart rate of 153 which is 88% predicted maximal heart rate. He had a markedly hypertensive response to exercise.  Given patient nonspecific ST changes.  With exercise he developed approximate 2 mm of ST segment depression.  These changes resolved fairly quickly during recovery phase.  This is interpreted as a nondiagnostic exercise test due to baseline ST and T wave changes. He did develop fairly significant ST depression at peak exercise but this is likely associated with his marked hypertension  . Suggest [place] study for further evaluation to rule out ischemia.    Disposition   Pt is being discharged home today in good condition.  Follow-up Plans & Appointments     Follow-up Information     Emeterio Reeve, DO Follow up.   Specialty: Osteopathic Medicine Why: as scheulde later this month Contact information: [address] [place] [address] [phone]         Park Liter, MD .   Specialty: Cardiology Contact information: [place] [address] [phone]                Discharge Instructions     Diet - low sodium heart healthy   Complete by: As directed    Discharge instructions   Complete by: As directed    No driving for 48 hours. No lifting over 5 lbs for 1 week. No sexual activity for 1 week. You may return to work on 11/25/20 Keep procedure site clean & dry. If you notice increased pain, swelling, bleeding or pus, call/return!  You may shower, but no soaking baths/hot tubs/pools for 1 week.   Increase activity  slowly   Complete by: As directed        Discharge Medications   Allergies as of 11/22/2020       Reactions   Other    Ants - see notes from ER visit 02/2018   Prednisone    Topiramate    Anxiety, dizzy, depressed, suicidal thoughts, agitated        Medication List     TAKE these medications    aspirin EC 81 MG tablet Take 1 tablet (81 mg total) by mouth daily. Swallow whole.   atorvastatin 40 MG tablet Commonly known as: LIPITOR Take 1 tablet (40 mg total) by mouth daily. Start taking on: November 23, 2020   carvedilol 6.25 MG tablet Commonly known as: COREG Take 1 tablet (6.25 mg total) by mouth 2 (two) times daily with a meal.           Outstanding Labs/Studies  Consider OP f/u labs 6-8 weeks given statin initiation this admission.   Duration of Discharge Encounter   Greater than 30 minutes including physician time.  Mahalia Longest Lynn, PA 11/22/2020, 2:14 PM

## 2020-11-22 NOTE — Progress Notes (Signed)
Patient ID: Joshua Brady, male   DOB: 1976-01-05, 45 y.o.   MRN: [id number]    Subjective:  Seen prior to cath discussed with wife at bedside He seems to think something is not right but most of his  Issues may be from obesity For cath this am   Objective:  Vitals:   11/21/20 2158 11/22/20 0028 11/22/20 0334 11/22/20 0828  BP:  128/87 (!) 141/83   Pulse:  75 71   Resp:  15 16   Temp:  98.1 F (36.7 C) 97.7 F (36.5 C)   TempSrc:  Oral Oral   SpO2:  98% 99% 97%  Weight: 130.8 kg     Height:        Intake/Output from previous day:  Intake/Output Summary (Last 24 hours) at 11/22/2020 0853 Last data filed at 11/22/2020 0600 Gross per 24 hour  Intake 1420.32 ml  Output --  Net 1420.32 ml    Physical Exam: Affect appropriate Healthy:  appears stated age HEENT: normal Neck supple with no adenopathy JVP normal no bruits no thyromegaly Lungs clear with no wheezing and good diaphragmatic motion Heart:  S1/S2 no murmur, no rub, gallop or click PMI normal Abdomen: benighn, BS positve, no tenderness, no AAA no bruit.  No HSM or HJR Distal pulses intact with no bruits No edema Neuro non-focal Skin warm and dry No muscular weakness   Lab Results: Basic Metabolic Panel: Recent Labs    11/21/20 0815 11/21/20 1759 11/22/20 0423  NA 137  --  137  K 4.5  --  4.2  CL 102  --  103  CO2 28  --  24  GLUCOSE 132*  --  111*  BUN 12  --  11  CREATININE 0.86 0.88 0.94  CALCIUM 9.3  --  9.3   Liver Function Tests: No results for input(s): AST, ALT, ALKPHOS, BILITOT, PROT, ALBUMIN in the last 72 hours. No results for input(s): LIPASE, AMYLASE in the last 72 hours. CBC: Recent Labs    11/21/20 0815 11/21/20 1759 11/22/20 0423  WBC 7.7 10.1 7.7  NEUTROABS 4.0  --   --   HGB 14.3 14.0 13.3  HCT 42.5 43.6 40.3  MCV 86.7 87.7 87.2  PLT 290 298 279   Cardiac Enzymes: No results for input(s): CKTOTAL, CKMB, CKMBINDEX, TROPONINI in the last 72 hours. BNP: Invalid  input(s): POCBNP D-Dimer: Recent Labs    11/21/20 0815  DDIMER 0.40   Hemoglobin A1C: No results for input(s): HGBA1C in the last 72 hours. Fasting Lipid Panel: No results for input(s): CHOL, HDL, LDLCALC, TRIG, CHOLHDL, LDLDIRECT in the last 72 hours. Thyroid Function Tests: No results for input(s): TSH, T4TOTAL, T3FREE, THYROIDAB in the last 72 hours.  Invalid input(s): FREET3 Anemia Panel: No results for input(s): VITAMINB12, FOLATE, FERRITIN, TIBC, IRON, RETICCTPCT in the last 72 hours.  Imaging: DG Chest Port 1 View  Result Date: 11/21/2020 CLINICAL DATA:  Chest pain and shortness of breath EXAM: PORTABLE CHEST 1 VIEW COMPARISON:  September 10, 2020 FINDINGS: There is cardiac enlargement pulmonary vascularity normal. Lungs clear. No adenopathy no bone lesions. No pneumothorax. IMPRESSION: Lungs clear.  Heart mildly enlarged, stable.  No adenopathy. Electronically Signed   By: Lowella Grip III M.D.   On: 11/21/2020 08:37   ECHOCARDIOGRAM COMPLETE  Result Date: 11/21/2020    ECHOCARDIOGRAM REPORT   Patient Name:   Joshua Brady Date of Exam: 11/21/2020 Medical Rec #:  [id number]    Height:  72.0 in Accession #:    [id number]   Weight:       288.4 lb Date of Birth:  Aug 26, 1975    BSA:          2.489 m Patient Age:    35 years     BP:           135/93 mmHg Patient Gender: M            HR:           68 bpm. Exam Location:  Inpatient Procedure: 2D Echo, Color Doppler, Cardiac Doppler and Intracardiac            Opacification Agent STAT ECHO Indications:    R06.9 DOE  History:        Patient has prior history of Echocardiogram examinations, most                 recent 03/11/2018. Risk Factors:Dyslipidemia and Sleep Apnea.  Sonographer:    Raquel Sarna Senior RDCS Referring Phys: [id number] [place]  1. Left ventricular ejection fraction, by estimation, is 60 to 65%. The left ventricle has normal function. Left ventricular endocardial border not optimally defined to evaluate regional  wall motion. Left ventricular diastolic parameters are consistent with Grade II diastolic dysfunction (pseudonormalization).  2. Right ventricular systolic function is normal. The right ventricular size is normal. Tricuspid regurgitation signal is inadequate for assessing PA pressure.  3. Left atrial size was mildly dilated.  4. The mitral valve was not well visualized. No evidence of mitral valve regurgitation. No evidence of mitral stenosis.  5. The aortic valve is tricuspid. Aortic valve regurgitation is not visualized. Mild aortic valve sclerosis is present, with no evidence of aortic valve stenosis.  6. The inferior vena cava is normal in size with greater than 50% respiratory variability, suggesting right atrial pressure of 3 mmHg.  7. Technically difficult study with poor acoustic windows. FINDINGS  Left Ventricle: Left ventricular ejection fraction, by estimation, is 60 to 65%. The left ventricle has normal function. Left ventricular endocardial border not optimally defined to evaluate regional wall motion. Definity contrast agent was given IV to delineate the left ventricular endocardial borders. The left ventricular internal cavity size was normal in size. There is no left ventricular hypertrophy. Left ventricular diastolic parameters are consistent with Grade II diastolic dysfunction (pseudonormalization). Right Ventricle: The right ventricular size is normal. No increase in right ventricular wall thickness. Right ventricular systolic function is normal. Tricuspid regurgitation signal is inadequate for assessing PA pressure. Left Atrium: Left atrial size was mildly dilated. Right Atrium: Right atrial size was normal in size. Pericardium: There is no evidence of pericardial effusion. Mitral Valve: The mitral valve was not well visualized. There is mild calcification of the mitral valve leaflet(s). Mild mitral annular calcification. No evidence of mitral valve regurgitation. No evidence of mitral valve  stenosis. Tricuspid Valve: The tricuspid valve is normal in structure. Tricuspid valve regurgitation is not demonstrated. Aortic Valve: The aortic valve is tricuspid. Aortic valve regurgitation is not visualized. Mild aortic valve sclerosis is present, with no evidence of aortic valve stenosis. Pulmonic Valve: The pulmonic valve was normal in structure. Pulmonic valve regurgitation is not visualized. Aorta: The aortic root is normal in size and structure. Venous: The inferior vena cava is normal in size with greater than 50% respiratory variability, suggesting right atrial pressure of 3 mmHg. IAS/Shunts: No atrial level shunt detected by color flow Doppler.  LEFT VENTRICLE PLAX 2D LVIDd:  4.20 cm  Diastology LVIDs:         2.70 cm  LV e' medial:    6.31 cm/s LV PW:         1.00 cm  LV E/e' medial:  10.9 LV IVS:        0.90 cm  LV e' lateral:   7.72 cm/s LVOT diam:     2.10 cm  LV E/e' lateral: 8.9 LV SV:         65 LV SV Index:   26 LVOT Area:     3.46 cm  RIGHT VENTRICLE RV S prime:     13.80 cm/s TAPSE (M-mode): 2.5 cm LEFT ATRIUM           Index       RIGHT ATRIUM           Index LA diam:      2.90 cm 1.17 cm/m  RA Area:     15.10 cm LA Vol (A2C): 45.6 ml 18.32 ml/m RA Volume:   34.70 ml  13.94 ml/m LA Vol (A4C): 90.3 ml 36.28 ml/m  AORTIC VALVE LVOT Vmax:   95.30 cm/s LVOT Vmean:  79.500 cm/s LVOT VTI:    0.187 m  AORTA Ao Root diam: 3.20 cm Ao Asc diam:  3.10 cm MITRAL VALVE MV Area (PHT): 3.26 cm    SHUNTS MV Decel Time: 233 msec    Systemic VTI:  0.19 m MV E velocity: 69.00 cm/s  Systemic Diam: 2.10 cm MV A velocity: 45.80 cm/s MV E/A ratio:  1.51 Loralie Champagne MD Electronically signed by Loralie Champagne MD Signature Date/Time: 11/21/2020/5:03:06 PM    Final     Cardiac Studies:  ECG: SR normal    Telemetry:  NSR 11/22/2020   Echo: Normal   Medications:    [MAR Hold] aspirin EC  81 mg Oral Daily   [MAR Hold] atorvastatin  40 mg Oral Daily   [MAR Hold] carvedilol  3.125 mg Oral BID WC    [MAR Hold] heparin  5,000 Units Subcutaneous Q8H   [MAR Hold] sodium chloride flush  3 mL Intravenous Q12H      sodium chloride     sodium chloride 75 mL/hr at 11/22/20 0600    Assessment/Plan:   Chest pain / dyspnea:  for cath this am Told lab not to do LV gram Echo baseline ECG normal Positive ETT 11/12/20 If cath is clean would do chest CTA to r/o PE and d/c home latter today   Jenkins Rouge 11/22/2020, 8:53 AM

## 2020-11-22 NOTE — Interval H&P Note (Signed)
Cath Lab Visit (complete for each Cath Lab visit)  Clinical Evaluation Leading to the Procedure:   ACS: No.  Non-ACS:    Anginal Classification: CCS II  Anti-ischemic medical therapy: Cath Lab Visit (complete for each Cath Lab visit)  Clinical Evaluation Leading to the Procedure:   ACS: No.  Non-ACS:    Anginal Classification: CCS II  Anti-ischemic medical therapy: Minimal Therapy (1 class of medications)  Non-Invasive Test Results: Intermediate-risk stress test findings: cardiac mortality 1-3%/year  Prior CABG: No previous CABG        Non-Invasive Test Results: Intermediate-risk stress test findings: cardiac mortality 1-3%/year  Prior CABG: No previous CABG      History and Physical Interval Note:  11/22/2020 8:33 AM  Joshua Brady  has presented today for surgery, with the diagnosis of chestp pain.  The various methods of treatment have been discussed with the patient and family. After consideration of risks, benefits and other options for treatment, the patient has consented to  Procedure(s): LEFT HEART CATH AND CORONARY ANGIOGRAPHY (N/A) as a surgical intervention.  The patient's history has been reviewed, patient examined, no change in status, stable for surgery.  I have reviewed the patient's chart and labs.  Questions were answered to the patient's satisfaction.     Shelva Majestic

## 2020-11-22 NOTE — Progress Notes (Signed)
Patieng given discharge instructions along with work note.  Patient stated understanding.

## 2020-11-22 NOTE — Plan of Care (Signed)

## 2020-12-12 ENCOUNTER — Other Ambulatory Visit (HOSPITAL_BASED_OUTPATIENT_CLINIC_OR_DEPARTMENT_OTHER): Payer: Self-pay

## 2020-12-12 ENCOUNTER — Encounter: Payer: Self-pay | Admitting: Osteopathic Medicine

## 2020-12-12 ENCOUNTER — Other Ambulatory Visit: Payer: Self-pay

## 2020-12-12 ENCOUNTER — Ambulatory Visit (INDEPENDENT_AMBULATORY_CARE_PROVIDER_SITE_OTHER): Payer: No Typology Code available for payment source | Admitting: Osteopathic Medicine

## 2020-12-12 VITALS — BP 134/84 | HR 81 | Temp 98.1°F | Wt 284.0 lb

## 2020-12-12 DIAGNOSIS — R0981 Nasal congestion: Secondary | ICD-10-CM

## 2020-12-12 DIAGNOSIS — Z0000 Encounter for general adult medical examination without abnormal findings: Secondary | ICD-10-CM

## 2020-12-12 DIAGNOSIS — R079 Chest pain, unspecified: Secondary | ICD-10-CM | POA: Diagnosis not present

## 2020-12-12 DIAGNOSIS — I10 Essential (primary) hypertension: Secondary | ICD-10-CM | POA: Diagnosis not present

## 2020-12-12 DIAGNOSIS — G4730 Sleep apnea, unspecified: Secondary | ICD-10-CM | POA: Diagnosis not present

## 2020-12-12 DIAGNOSIS — L309 Dermatitis, unspecified: Secondary | ICD-10-CM

## 2020-12-12 MED ORDER — CLOBETASOL PROPIONATE 0.05 % EX CREA
1.0000 "application " | TOPICAL_CREAM | Freq: Two times a day (BID) | CUTANEOUS | 1 refills | Status: DC
  Filled 2020-12-12: qty 30, 30d supply, fill #0
  Filled 2021-04-04: qty 30, 30d supply, fill #1

## 2020-12-12 NOTE — Patient Instructions (Signed)
General Preventive Care Most recent labs: reviewed from hospital - would recheck after 6 weeks on cholesterol medicine - reminder set to alert you via MyChart when due.  Blood pressure goal 130/80 or less.  Tobacco: don't!  Alcohol: responsible moderation is ok for most adults - if you have concerns about your alcohol intake, please talk to me!  Exercise: as tolerated to reduce risk of cardiovascular disease and diabetes. Strength training will also prevent osteoporosis.  Mental health: if need for mental health care (medicines, counseling, other), or concerns about moods, please let me know!  Sexual / Reproductive health: if need for STI testing, or if concerns with libido/pain problems, please let me know! If you need to discuss family planning, please let me know!  Advanced Directive: Living Will and/or Healthcare Power of Attorney recommended for all adults, regardless of age or health.  Vaccines Flu vaccine: for almost everyone, every fall.  Shingles vaccine: after age 28.  Pneumonia vaccines: after age 25, or sooner if certain medical conditions. Tetanus booster: every 10 years, due 2027 COVID vaccine: STRONGLY RECOMMENDED  Cancer screenings  Colon cancer screening: for everyone age 61-75. Colonoscopy available for all, many people also qualify for the Cologuard stool test  Prostate cancer screening: PSA blood test age 44-71 Lung cancer screening: not needed for non-smokers Infection screenings  HIV: recommended screening at least once age 23-65, more often as needed. Gonorrhea/Chlamydia: screening as needed Hepatitis C: recommended once for everyone age 54-27 TB: certain at-risk populations, or depending on work requirements and/or travel history Other Bone Density Test: recommended for men at age 43

## 2020-12-12 NOTE — Progress Notes (Signed)
Joshua Brady is a 45 y.o. male who presents to  [place] at [facility]  today, 12/12/20, seeking care for the following:  Annual physical  Continued sinus pain/pressure not responsive to OTC / allergy Tx      ASSESSMENT & PLAN with other pertinent findings:  The primary encounter diagnosis was Annual physical exam. Diagnoses of Chest pain, unspecified type, cardiac cath 11/2020 no significant stenosis (20% LAD stenosis and no other concerns, no PCI), Essential hypertension, Sleep apnea, unspecified type, Eczema, unspecified type, and Sinus congestion were also pertinent to this visit.    Patient Instructions  General Preventive Care Most recent labs: reviewed from hospital - would recheck after 6 weeks on cholesterol medicine - reminder set to alert you via MyChart when due.  Blood pressure goal 130/80 or less.  Tobacco: don't!  Alcohol: responsible moderation is ok for most adults - if you have concerns about your alcohol intake, please talk to me!  Exercise: as tolerated to reduce risk of cardiovascular disease and diabetes. Strength training will also prevent osteoporosis.  Mental health: if need for mental health care (medicines, counseling, other), or concerns about moods, please let me know!  Sexual / Reproductive health: if need for STI testing, or if concerns with libido/pain problems, please let me know! If you need to discuss family planning, please let me know!  Advanced Directive: Living Will and/or Healthcare Power of Attorney recommended for all adults, regardless of age or health.  Vaccines Flu vaccine: for almost everyone, every fall.  Shingles vaccine: after age 85.  Pneumonia vaccines: after age 21, or sooner if certain medical conditions. Tetanus booster: every 10 years, due 2027 COVID vaccine: STRONGLY RECOMMENDED  Cancer screenings  Colon cancer screening: for everyone age 66-75. Colonoscopy available for all, many  people also qualify for the Cologuard stool test  Prostate cancer screening: PSA blood test age 64-71 Lung cancer screening: not needed for non-smokers Infection screenings  HIV: recommended screening at least once age 30-65, more often as needed. Gonorrhea/Chlamydia: screening as needed Hepatitis C: recommended once for everyone age 38-93 TB: certain at-risk populations, or depending on work requirements and/or travel history Other Bone Density Test: recommended for men at age 61  Orders Placed This Encounter  Procedures   Ambulatory referral to ENT   LDL 166 on 10/29/20, atorvastatin started 11/23/20  BP Readings from Last 3 Encounters:  12/12/20 134/84  11/22/20 (!) 142/99  10/29/20 136/76     Meds ordered this encounter  Medications   clobetasol cream (TEMOVATE) 0.05 %    Sig: Apply 1 application on to the skin 2 (two) times daily.    Dispense:  30 g    Refill:  1   The 10-year ASCVD risk score Mikey Bussing DC Jr., et al., 2013) is: 7.8%   Values used to calculate the score:     Age: 15 years     Sex: Male     Is Non-Hispanic African American: No     Diabetic: No     Tobacco smoker: No     Systolic Blood Pressure: 734 mmHg     Is BP treated: Yes     HDL Cholesterol: 28 mg/dL     Total Cholesterol: 231 mg/dL    See below for relevant physical exam findings  See below for recent lab and imaging results reviewed  Medications, allergies, PMH, PSH, SocH, FamH reviewed below    Follow-up instructions: Return in about 1 year (around  12/12/2021) for ANNUAL (call week prior to visit for lab orders). Labs in end of July to [facility] .                                        Exam:  BP 134/84 (BP Location: Left Arm, Patient Position: Sitting, Cuff Size: Large)   Pulse 81   Temp 98.1 F (36.7 C) (Oral)   Wt 284 lb (128.8 kg)   BMI 38.52 kg/m  Constitutional: VS see above. General Appearance: alert, well-developed,  well-nourished, NAD Neck: No masses, trachea midline.  Respiratory: Normal respiratory effort. no wheeze, no rhonchi, no rales Cardiovascular: S1/S2 normal, no murmur, no rub/gallop auscultated. RRR.  Musculoskeletal: Gait normal. Symmetric and independent movement of all extremities Abdominal: non-tender, non-distended, no appreciable organomegaly, neg Murphy's, BS WNLx4 Neurological: Normal balance/coordination. No tremor. Skin: warm, dry, intact.  Psychiatric: Normal judgment/insight. Normal mood and affect. Oriented x3.   Current Meds  Medication Sig   aspirin EC 81 MG tablet Take 1 tablet (81 mg total) by mouth daily. Swallow whole.   atorvastatin (LIPITOR) 40 MG tablet Take 1 tablet (40 mg total) by mouth daily.   carvedilol (COREG) 6.25 MG tablet Take 1 tablet (6.25 mg total) by mouth 2 (two) times daily with a meal.   clobetasol cream (TEMOVATE) 9.83 % Apply 1 application on to the skin 2 (two) times daily.    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   Shortness of breath 03/03/2018   Palpitations 03/03/2018   Borderline high cholesterol 01/25/2017   Dyslipidemia 01/25/2017   History of brain concussion 01/21/2017   History of low back pain 01/21/2017   Head injury 2016    Family History  Problem Relation Age of Onset   Alcohol abuse Father    Other Neg Hx        low testosterone    Social History   Tobacco Use  Smoking Status Never  Smokeless Tobacco Never    Past  Surgical History:  Procedure Laterality Date   Broken Arm     LEFT HEART CATH AND CORONARY ANGIOGRAPHY N/A 11/22/2020   Procedure: LEFT HEART CATH AND CORONARY ANGIOGRAPHY;  Surgeon: Troy Sine, MD;  Location: [facility];  Service: Cardiovascular;  Laterality: N/A;   LIVER BIOPSY      Immunization History  Administered Date(s) Administered   Influenza-Unspecified 02/04/2017   Tdap 11/01/2015    Recent Results (from the past 2160 hour(s))  Lipid panel     Status: Abnormal   Collection Time: 10/29/20  9:56 AM  Result Value Ref Range   Cholesterol, Total 231 (H) 100 - 199 mg/dL   Triglycerides 199 (H) 0 - 149 mg/dL   HDL 28 (L) >39 mg/dL   VLDL Cholesterol Cal 37 5 - 40 mg/dL   LDL Chol  Calc (NIH) 166 (H) 0 - 99 mg/dL   Chol/HDL Ratio 8.3 (H) 0.0 - 5.0 ratio    Comment:                                   T. Chol/HDL Ratio                                             Men  Women                               1/2 Avg.Risk  3.4    3.3                                   Avg.Risk  5.0    4.4                                2X Avg.Risk  9.6    7.1                                3X Avg.Risk 23.4   11.0   Exercise Tolerance Test     Status: None   Collection Time: 11/12/20  2:13 PM  Result Value Ref Range   Rest HR 71 bpm   Rest BP 158/95 mmHg   RPE 15    Exercise duration (sec) 0 sec   Percent HR 88 %   Exercise duration (min) 6 min   Estimated workload 7.0 METS   Peak HR 153 bpm   Peak BP 258/72 mmHg   MPHR 173 bpm  CBC with Differential/Platelet     Status: None   Collection Time: 11/21/20  8:15 AM  Result Value Ref Range   WBC 7.7 4.0 - 10.5 K/uL   RBC 4.90 4.22 - 5.81 MIL/uL   Hemoglobin 14.3 13.0 - 17.0 g/dL   HCT 42.5 39.0 - 52.0 %   MCV 86.7 80.0 - 100.0 fL   MCH 29.2 26.0 - 34.0 pg   MCHC 33.6 30.0 - 36.0 g/dL   RDW 13.1 11.5 - 15.5 %   Platelets 290 150 - 400 K/uL   nRBC 0.0 0.0 - 0.2 %   Neutrophils Relative % 51 %   Neutro Abs 4.0 1.7 - 7.7 K/uL    Lymphocytes Relative 35 %   Lymphs Abs 2.7 0.7 - 4.0 K/uL   Monocytes Relative 10 %   Monocytes Absolute 0.7 0.1 - 1.0 K/uL   Eosinophils Relative 3 %   Eosinophils Absolute 0.2 0.0 - 0.5 K/uL   Basophils Relative 1 %   Basophils Absolute 0.1 0.0 - 0.1 K/uL   Immature Granulocytes 0 %   Abs Immature Granulocytes 0.03 0.00 - 0.07 K/uL    Comment: Performed at [facility], [address] [place], [name] [address]  Basic metabolic panel     Status: Abnormal   Collection Time: 11/21/20  8:15 AM  Result Value Ref Range   Sodium 137 135 - 145 mmol/L   Potassium 4.5 3.5 - 5.1 mmol/L  Chloride 102 98 - 111 mmol/L   CO2 28 22 - 32 mmol/L   Glucose, Bld 132 (H) 70 - 99 mg/dL    Comment: Glucose reference range applies only to samples taken after fasting for at least 8 hours.   BUN 12 6 - 20 mg/dL   Creatinine, Ser 0.86 0.61 - 1.24 mg/dL   Calcium 9.3 8.9 - 10.3 mg/dL   GFR, Estimated >60 >60 mL/min    Comment: (NOTE) Calculated using the CKD-EPI Creatinine Equation (2021)    Anion gap 7 5 - 15    Comment: Performed at [facility], [address] [place], [name] [address]  Troponin I (High Sensitivity)     Status: None   Collection Time: 11/21/20  8:15 AM  Result Value Ref Range   Troponin I (High Sensitivity) 4 <18 ng/L    Comment: (NOTE) Elevated high sensitivity troponin I (hsTnI) values and significant  changes across serial measurements may suggest ACS but many other  chronic and acute conditions are known to elevate hsTnI results.  Refer to the "Links" section for chest pain algorithms and additional  guidance. Performed at [facility], [address] [place], [name] [address]   D-dimer, quantitative     Status: None   Collection Time: 11/21/20  8:15 AM  Result Value Ref Range   D-Dimer, Quant 0.40 0.00 - 0.50 ug/mL-FEU    Comment: (NOTE) At the manufacturer cut-off value of 0.5 g/mL FEU, this assay has a negative predictive value  of 95-100%.This assay is intended for use in conjunction with a clinical pretest probability (PTP) assessment model to exclude pulmonary embolism (PE) and deep venous thrombosis (DVT) in outpatients suspected of PE or DVT. Results should be correlated with clinical presentation. Performed at [facility], [address] [place], [name] [address]   Resp Panel by RT-PCR (Flu A&B, Covid)     Status: None   Collection Time: 11/21/20  9:40 AM  Result Value Ref Range   SARS Coronavirus 2 by RT PCR NEGATIVE NEGATIVE    Comment: (NOTE) SARS-CoV-2 target nucleic acids are NOT DETECTED.  The SARS-CoV-2 RNA is generally detectable in upper respiratory specimens during the acute phase of infection. The lowest concentration of SARS-CoV-2 viral copies this assay can detect is 138 copies/mL. A negative result does not preclude SARS-Cov-2 infection and should not be used as the sole basis for treatment or other patient management decisions. A negative result may occur with  improper specimen collection/handling, submission of specimen other than nasopharyngeal swab, presence of viral mutation(s) within the areas targeted by this assay, and inadequate number of viral copies(<138 copies/mL). A negative result must be combined with clinical observations, patient history, and epidemiological information. The expected result is Negative.  Fact Sheet for Patients:  EntrepreneurPulse.com.au  Fact Sheet for Healthcare Providers:  IncredibleEmployment.be  This test is no t yet approved or cleared by the [place] FDA and  has been authorized for detection and/or diagnosis of SARS-CoV-2 by FDA under an Emergency Use Authorization (EUA). This EUA will remain  in effect (meaning this test can be used) for the duration of the COVID-19 declaration under Section 564(b)(1) of the Act, 21 U.S.C.section 360bbb-3(b)(1), unless the authorization is terminated   or revoked sooner.       Influenza A by PCR NEGATIVE NEGATIVE   Influenza B by PCR NEGATIVE NEGATIVE    Comment: (NOTE) The Xpert Xpress SARS-CoV-2/FLU/RSV plus assay is intended  as an aid in the diagnosis of influenza from Nasopharyngeal swab specimens and should not be used as a sole basis for treatment. Nasal washings and aspirates are unacceptable for Xpert Xpress SARS-CoV-2/FLU/RSV testing.  Fact Sheet for Patients: EntrepreneurPulse.com.au  Fact Sheet for Healthcare Providers: IncredibleEmployment.be  This test is not yet approved or cleared by the [place] FDA and has been authorized for detection and/or diagnosis of SARS-CoV-2 by FDA under an Emergency Use Authorization (EUA). This EUA will remain in effect (meaning this test can be used) for the duration of the COVID-19 declaration under Section 564(b)(1) of the Act, 21 U.S.C. section 360bbb-3(b)(1), unless the authorization is terminated or revoked.  Performed at [facility], [address] [place], [name] [address]   Troponin I (High Sensitivity)     Status: None   Collection Time: 11/21/20 10:25 AM  Result Value Ref Range   Troponin I (High Sensitivity) 4 <18 ng/L    Comment: (NOTE) Elevated high sensitivity troponin I (hsTnI) values and significant  changes across serial measurements may suggest ACS but many other  chronic and acute conditions are known to elevate hsTnI results.  Refer to the "Links" section for chest pain algorithms and additional  guidance. Performed at [facility], [address] [place], [name] [address]   ECHOCARDIOGRAM COMPLETE     Status: None   Collection Time: 11/21/20  4:24 PM  Result Value Ref Range   Weight 4,614.4 oz   Height 72 in   BP 146/96 mmHg   S' Lateral 2.70 cm   Area-P 1/2 3.26 cm2  HIV Antibody (routine testing w rflx)     Status: None   Collection Time: 11/21/20  5:59 PM  Result Value Ref  Range   HIV Screen 4th Generation wRfx Non Reactive Non Reactive    Comment: Performed at [facility], [place] [address], [place] [address]  CBC     Status: None   Collection Time: 11/21/20  5:59 PM  Result Value Ref Range   WBC 10.1 4.0 - 10.5 K/uL   RBC 4.97 4.22 - 5.81 MIL/uL   Hemoglobin 14.0 13.0 - 17.0 g/dL   HCT 43.6 39.0 - 52.0 %   MCV 87.7 80.0 - 100.0 fL   MCH 28.2 26.0 - 34.0 pg   MCHC 32.1 30.0 - 36.0 g/dL   RDW 13.2 11.5 - 15.5 %   Platelets 298 150 - 400 K/uL   nRBC 0.0 0.0 - 0.2 %    Comment: Performed at [facility], [place] [address] [place], [place] [address]  Creatinine, serum     Status: None   Collection Time: 11/21/20  5:59 PM  Result Value Ref Range   Creatinine, Ser 0.88 0.61 - 1.24 mg/dL   GFR, Estimated >60 >60 mL/min    Comment: (NOTE) Calculated using the CKD-EPI Creatinine Equation (2021) Performed at [facility] [address], [place], [place] [address]   Hemoglobin A1c     Status: Abnormal   Collection Time: 11/22/20  4:23 AM  Result Value Ref Range   Hgb A1c MFr Bld 6.3 (H) 4.8 - 5.6 %    Comment: (NOTE)         Prediabetes: 5.7 - 6.4         Diabetes: >6.4         Glycemic control for adults with diabetes: <7.0    Mean Plasma Glucose 134 mg/dL  Comment: (NOTE) Performed At: [facility] [place], [name] [id number] Rush Farmer MD LK:[id number]   Basic metabolic panel     Status: Abnormal   Collection Time: 11/22/20  4:23 AM  Result Value Ref Range   Sodium 137 135 - 145 mmol/L   Potassium 4.2 3.5 - 5.1 mmol/L   Chloride 103 98 - 111 mmol/L   CO2 24 22 - 32 mmol/L   Glucose, Bld 111 (H) 70 - 99 mg/dL    Comment: Glucose reference range applies only to samples taken after fasting for at least 8 hours.   BUN 11 6 - 20 mg/dL   Creatinine, Ser 0.94 0.61 - 1.24 mg/dL   Calcium 9.3 8.9 - 10.3 mg/dL   GFR, Estimated >60 >60 mL/min    Comment: (NOTE) Calculated using the CKD-EPI  Creatinine Equation (2021)    Anion gap 10 5 - 15    Comment: Performed at [facility] [address], [place], [name] [address]  CBC     Status: None   Collection Time: 11/22/20  4:23 AM  Result Value Ref Range   WBC 7.7 4.0 - 10.5 K/uL   RBC 4.62 4.22 - 5.81 MIL/uL   Hemoglobin 13.3 13.0 - 17.0 g/dL   HCT 40.3 39.0 - 52.0 %   MCV 87.2 80.0 - 100.0 fL   MCH 28.8 26.0 - 34.0 pg   MCHC 33.0 30.0 - 36.0 g/dL   RDW 13.3 11.5 - 15.5 %   Platelets 279 150 - 400 K/uL   nRBC 0.0 0.0 - 0.2 %    Comment: Performed at [facility], [place] [address], [place], [place] [address]    No results found.     All questions at time of visit were answered - patient instructed to contact office with any additional concerns or updates. ER/RTC precautions were reviewed with the patient as applicable.   Please note: manual typing as well as voice recognition software may have been used to produce this document - typos may escape review. Please contact Dr. Sheppard Coil for any needed clarifications.  Marland Kitchen

## 2020-12-13 ENCOUNTER — Other Ambulatory Visit (HOSPITAL_BASED_OUTPATIENT_CLINIC_OR_DEPARTMENT_OTHER): Payer: Self-pay

## 2020-12-17 ENCOUNTER — Other Ambulatory Visit (HOSPITAL_BASED_OUTPATIENT_CLINIC_OR_DEPARTMENT_OTHER): Payer: Self-pay

## 2021-01-02 ENCOUNTER — Encounter: Payer: Self-pay | Admitting: Cardiology

## 2021-01-02 ENCOUNTER — Other Ambulatory Visit: Payer: Self-pay

## 2021-01-02 ENCOUNTER — Ambulatory Visit (INDEPENDENT_AMBULATORY_CARE_PROVIDER_SITE_OTHER): Payer: No Typology Code available for payment source | Admitting: Cardiology

## 2021-01-02 VITALS — BP 130/72 | HR 76 | Ht 72.0 in | Wt 281.0 lb

## 2021-01-02 DIAGNOSIS — R002 Palpitations: Secondary | ICD-10-CM | POA: Diagnosis not present

## 2021-01-02 DIAGNOSIS — R0789 Other chest pain: Secondary | ICD-10-CM | POA: Diagnosis not present

## 2021-01-02 DIAGNOSIS — E785 Hyperlipidemia, unspecified: Secondary | ICD-10-CM

## 2021-01-02 DIAGNOSIS — I2510 Atherosclerotic heart disease of native coronary artery without angina pectoris: Secondary | ICD-10-CM | POA: Diagnosis not present

## 2021-01-02 DIAGNOSIS — R0602 Shortness of breath: Secondary | ICD-10-CM

## 2021-01-02 NOTE — Progress Notes (Signed)
Cardiology Office Note:    Date:  01/02/2021   ID:  Joshua Brady, DOB 11-Mar-1976, MRN [id number]  PCP:  Emeterio Reeve, DO  Cardiologist:  Jenne Campus, MD    Referring MD: Emeterio Reeve, DO   Chief Complaint  Patient presents with   Follow-up    Was seen at [facility] 1 month ago for chest pain. All test where negative. Patient is doing well now    History of Present Illness:    Joshua Brady is a 45 y.o. male with past medical history significant for dyslipidemia, I did see him in 2019 because of palpitations however work-up was unrevealing.  Recently he showed up again in the office complaining of having chest pain, plan EKG treadmill stress test has been performed which showed some hypertensive response with ST segment changes.  Plan was to perform nuclear stress test however in the meantime he ended up coming to emergency room because of chest pain.  Decision has been made to pursue cardiac catheterization which was performed.  That test showed 20% proximal LAD.  Since that time he is doing well.  He denies have any chest pain tightness squeezing pressure burning chest.  He also started making some changes in his life, he changed his diet, he started to be a little more active.  He lost over the few pounds.  Denies having any palpitations.  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   Low testosterone 04/29/2018   Lumbar degenerative disc disease 02/06/2020   Palpitations 03/03/2018   Sleep apnea 03/11/2018   SOB (shortness of breath) on exertion 03/03/2018   Viral illness 09/11/2019    Past Surgical History:  Procedure Laterality Date   Broken Arm     LEFT  HEART CATH AND CORONARY ANGIOGRAPHY N/A 11/22/2020   Procedure: LEFT HEART CATH AND CORONARY ANGIOGRAPHY;  Surgeon: Troy Sine, MD;  Location: [facility];  Service: Cardiovascular;  Laterality: N/A;   LIVER BIOPSY      Current Medications: Current Meds  Medication Sig   aspirin EC 81 MG tablet Take 1 tablet (81 mg total) by mouth daily. Swallow whole.   atorvastatin (LIPITOR) 40 MG tablet Take 1 tablet (40 mg total) by mouth daily.   carvedilol (COREG) 6.25 MG tablet Take 1 tablet (6.25 mg total) by mouth 2 (two) times daily with a meal.   clobetasol cream (TEMOVATE) 8.24 % Apply 1 application on to the skin 2 (two) times daily.   Coenzyme Q10 (CO Q-10 PO) Take 1 tablet by mouth daily at 12 noon. Unknown strenght   OVER THE COUNTER MEDICATION Take 1 tablet by mouth daily at 12 noon. Beet root/Unknown strength     Allergies:   Other, Prednisone, and Topiramate   Social History   Socioeconomic History   Marital status: Married    Spouse name: Alford Gamero   Number of children: 2   Years of education: Not on file   Highest education level: Not on file  Occupational History   Not on file  Tobacco Use   Smoking status: Never   Smokeless tobacco: Never  Vaping Use   Vaping  Use: Never used  Substance and Sexual Activity   Alcohol use: Yes   Drug use: No   Sexual activity: Yes    Birth control/protection: None  Other Topics Concern   Not on file  Social History Narrative   Not on file   Social Determinants of Health   Financial Resource Strain: Not on file  Food Insecurity: Not on file  Transportation Needs: Not on file  Physical Activity: Not on file  Stress: Not on file  Social Connections: Not on file     Family History: The patient's family history includes Alcohol abuse in his father. There is no history of Other. ROS:   Please see the history of present illness.    All 14 point review of systems negative except as described per history of present  illness  EKGs/Labs/Other Studies Reviewed:      Recent Labs: 06/21/2020: ALT 53 11/22/2020: BUN 11; Creatinine, Ser 0.94; Hemoglobin 13.3; Platelets 279; Potassium 4.2; Sodium 137  Recent Lipid Panel    Component Value Date/Time   CHOL 231 (H) 10/29/2020 0956   TRIG 199 (H) 10/29/2020 0956   HDL 28 (L) 10/29/2020 0956   CHOLHDL 8.3 (H) 10/29/2020 0956   CHOLHDL 6.8 (H) 03/03/2018 0914   VLDL 45 (H) 01/21/2017 0859   LDLCALC 166 (H) 10/29/2020 0956   LDLCALC 192 (H) 03/03/2018 0914    Physical Exam:    VS:  BP 130/72 (BP Location: Right Arm, Patient Position: Sitting)   Pulse 76   Ht 6' (1.829 m)   Wt 281 lb (127.5 kg)   SpO2 96%   BMI 38.11 kg/m     Wt Readings from Last 3 Encounters:  01/02/21 281 lb (127.5 kg)  12/12/20 284 lb (128.8 kg)  11/21/20 288 lb 5.8 oz (130.8 kg)     GEN:  Well nourished, well developed in no acute distress HEENT: Normal NECK: No JVD; No carotid bruits LYMPHATICS: No lymphadenopathy CARDIAC: RRR, no murmurs, no rubs, no gallops RESPIRATORY:  Clear to auscultation without rales, wheezing or rhonchi  ABDOMEN: Soft, non-tender, non-distended MUSCULOSKELETAL:  No edema; No deformity  SKIN: Warm and dry LOWER EXTREMITIES: no swelling NEUROLOGIC:  Alert and oriented x 3 PSYCHIATRIC:  Normal affect   ASSESSMENT:    1. Dyslipidemia   2. Coronary artery disease involving native coronary artery of native heart without angina pectoris   3. Atypical chest pain   4. Palpitations   5. Shortness of breath    PLAN:    In order of problems listed above:  Coronary artery disease with 20% lesion in proximal LAD.  He is on antiplatelet therapy which we will continue indefinitely.  He is already on statin which I will continue we will check his fasting lipid profile today.  We discussed need to exercise on the regular basis I reviewed guidelines for minimum level of exercising which is 5 times a week for 30 minutes moderate intensity exercise, we did  also discussed basic of Mediterranean diet.  He is very enthusiastic about all changes he is making his life and I strongly recommend to continue.  He also plans to exercise more aggressively. Dyslipidemia he is on Lipitor 40 is stable tolerated.  We will check his fasting lipid profile today. Palpitations: Denies having any. Shortness of breath getting better with increase level of exercise.  I encouraged him to exercise even more. Status post cardiac catheterization cardiac catheterization reviewed with the patient, he is access site to the right forearm  is healed completely.  No complications   Medication Adjustments/Labs and Tests Ordered: Current medicines are reviewed at length with the patient today.  Concerns regarding medicines are outlined above.  No orders of the defined types were placed in this encounter.  Medication changes: No orders of the defined types were placed in this encounter.   Signed, Park Liter, MD, [facility] 01/02/2021 8:39 AM    [place]

## 2021-01-02 NOTE — Patient Instructions (Signed)
Medication Instructions:  Your physician recommends that you continue on your current medications as directed. Please refer to the Current Medication list given to you today.  *If you need a refill on your cardiac medications before your next appointment, please call your pharmacy*   Lab Work: Your physician recommends that you return for lab work in:  TODAY: Lipid If you have labs (blood work) drawn today and your tests are completely normal, you will receive your results only by: [place] (if you have [place]) OR A paper copy in the mail If you have any lab test that is abnormal or we need to change your treatment, we will call you to review the results.   Testing/Procedures: None   Follow-Up: At [facility], you and your health needs are our priority.  As part of our continuing mission to provide you with exceptional heart care, we have created designated Provider Care Teams.  These Care Teams include your primary Cardiologist (physician) and Advanced Practice Providers (APPs -  Physician Assistants and Nurse Practitioners) who all work together to provide you with the care you need, when you need it.  We recommend signing up for the patient portal called "MyChart".  Sign up information is provided on this After Visit Summary.  MyChart is used to connect with patients for Virtual Visits (Telemedicine).  Patients are able to view lab/test results, encounter notes, upcoming appointments, etc.  Non-urgent messages can be sent to your provider as well.   To learn more about what you can do with MyChart, go to [email or web address].    Your next appointment:   6 month(s)  The format for your next appointment:   In Person  Provider:   Jenne Campus, MD   Other Instructions

## 2021-01-03 LAB — LIPID PANEL
Chol/HDL Ratio: 4.7 ratio (ref 0.0–5.0)
Cholesterol, Total: 170 mg/dL (ref 100–199)
HDL: 36 mg/dL — ABNORMAL LOW (ref >39)
LDL Chol Calc (NIH): 105 mg/dL — ABNORMAL HIGH (ref 0–99)
Triglycerides: 163 mg/dL — ABNORMAL HIGH (ref 0–149)
VLDL Cholesterol Cal: 29 mg/dL (ref 5–40)

## 2021-01-10 ENCOUNTER — Other Ambulatory Visit (HOSPITAL_BASED_OUTPATIENT_CLINIC_OR_DEPARTMENT_OTHER): Payer: Self-pay

## 2021-01-10 MED ORDER — FLUTICASONE PROPIONATE 50 MCG/ACT NA SUSP
NASAL | 6 refills | Status: DC
  Filled 2021-01-10 – 2021-01-21 (×2): qty 16, 30d supply, fill #0

## 2021-01-20 ENCOUNTER — Other Ambulatory Visit (HOSPITAL_BASED_OUTPATIENT_CLINIC_OR_DEPARTMENT_OTHER): Payer: Self-pay

## 2021-01-21 ENCOUNTER — Other Ambulatory Visit (HOSPITAL_BASED_OUTPATIENT_CLINIC_OR_DEPARTMENT_OTHER): Payer: Self-pay

## 2021-01-24 ENCOUNTER — Other Ambulatory Visit (HOSPITAL_BASED_OUTPATIENT_CLINIC_OR_DEPARTMENT_OTHER): Payer: Self-pay

## 2021-02-21 ENCOUNTER — Other Ambulatory Visit (HOSPITAL_BASED_OUTPATIENT_CLINIC_OR_DEPARTMENT_OTHER): Payer: Self-pay

## 2021-02-27 ENCOUNTER — Other Ambulatory Visit: Payer: Self-pay

## 2021-02-27 ENCOUNTER — Emergency Department (HOSPITAL_BASED_OUTPATIENT_CLINIC_OR_DEPARTMENT_OTHER)
Admission: EM | Admit: 2021-02-27 | Discharge: 2021-02-27 | Disposition: A | Discharge status: Home / Self Care | Payer: No Typology Code available for payment source | Attending: Emergency Medicine | Admitting: Emergency Medicine

## 2021-02-27 ENCOUNTER — Emergency Department (HOSPITAL_BASED_OUTPATIENT_CLINIC_OR_DEPARTMENT_OTHER): Payer: No Typology Code available for payment source

## 2021-02-27 ENCOUNTER — Encounter (HOSPITAL_BASED_OUTPATIENT_CLINIC_OR_DEPARTMENT_OTHER): Payer: Self-pay | Admitting: Emergency Medicine

## 2021-02-27 DIAGNOSIS — U071 COVID-19: Secondary | ICD-10-CM | POA: Diagnosis present

## 2021-02-27 LAB — COMPREHENSIVE METABOLIC PANEL
ALT: 57 U/L — ABNORMAL HIGH (ref 0–44)
AST: 38 U/L (ref 15–41)
Albumin: 3.5 g/dL (ref 3.5–5.0)
Alkaline Phosphatase: 97 U/L (ref 38–126)
Anion gap: 9 (ref 5–15)
BUN: 10 mg/dL (ref 6–20)
CO2: 27 mmol/L (ref 22–32)
Calcium: 9.5 mg/dL (ref 8.9–10.3)
Chloride: 98 mmol/L (ref 98–111)
Creatinine, Ser: 0.92 mg/dL (ref 0.61–1.24)
GFR, Estimated: >60 mL/min (ref >60)
Glucose, Bld: 147 mg/dL — ABNORMAL HIGH (ref 70–99)
Potassium: 4.4 mmol/L (ref 3.5–5.1)
Sodium: 134 mmol/L — ABNORMAL LOW (ref 135–145)
Total Bilirubin: 0.4 mg/dL (ref 0.3–1.2)
Total Protein: 8.3 g/dL — ABNORMAL HIGH (ref 6.5–8.1)

## 2021-02-27 LAB — CBC
HCT: 40.3 % (ref 39.0–52.0)
Hemoglobin: 13.5 g/dL (ref 13.0–17.0)
MCH: 28.8 pg (ref 26.0–34.0)
MCHC: 33.5 g/dL (ref 30.0–36.0)
MCV: 85.9 fL (ref 80.0–100.0)
Platelets: 343 10*3/uL (ref 150–400)
RBC: 4.69 MIL/uL (ref 4.22–5.81)
RDW: 13 % (ref 11.5–15.5)
WBC: 14.3 10*3/uL — ABNORMAL HIGH (ref 4.0–10.5)
nRBC: 0 % (ref 0.0–0.2)

## 2021-02-27 IMAGING — DX DG CHEST 1V PORT
1 series · 1 of 1 positions shown · non-contrast
Comparison: [DATE]

CLINICAL DATA: COVID and cough

EXAM:
PORTABLE CHEST 1 VIEW

[chest ap]
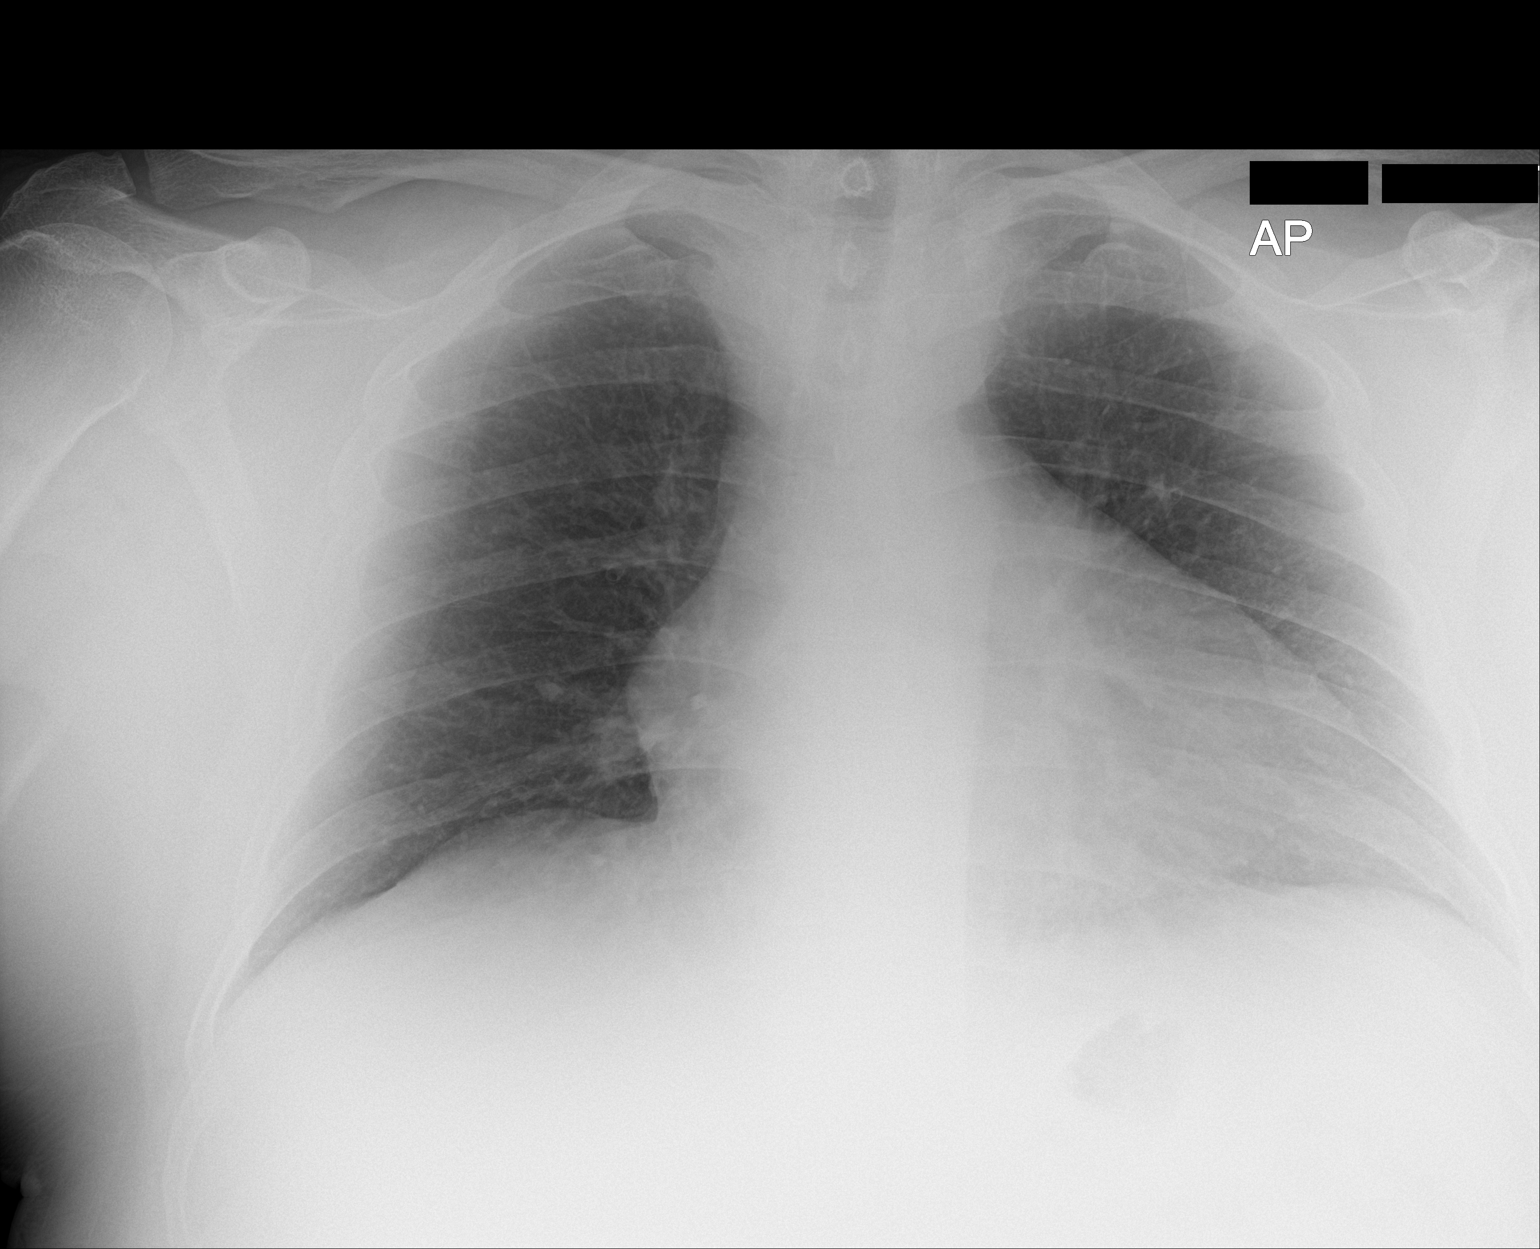

[1 of 1 positions shown; findings below may reference images not displayed]

FINDINGS: Generous heart size accentuated by mediastinal fat pad. Stable
aortic and hilar contours. There is no edema, consolidation,
effusion, or pneumothorax. Low volume chest.
IMPRESSION: Low volume chest without pneumonia.

## 2021-02-27 MED ORDER — KETOROLAC TROMETHAMINE 15 MG/ML IJ SOLN
15.0000 mg | Freq: Once | INTRAMUSCULAR | Status: AC
  Administered 2021-02-27: 15 mg via INTRAVENOUS
  Filled 2021-02-27: qty 1

## 2021-02-27 MED ORDER — SODIUM CHLORIDE 0.9 % IV BOLUS
1000.0000 mL | Freq: Once | INTRAVENOUS | Status: AC
  Administered 2021-02-27: 1000 mL via INTRAVENOUS

## 2021-02-27 MED ORDER — ONDANSETRON HCL 4 MG/2ML IJ SOLN
4.0000 mg | Freq: Once | INTRAMUSCULAR | Status: AC
  Administered 2021-02-27: 4 mg via INTRAVENOUS
  Filled 2021-02-27: qty 2

## 2021-02-27 NOTE — Discharge Instructions (Signed)
Follow CDC precautions regarding isolation and quarantine.  Continue to drink plenty of fluids.  Follow-up with your primary doctor.  If you develop chest pain, difficulty breathing, vomiting, or other new concerning symptom, come back to ER for reassessment.

## 2021-02-27 NOTE — ED Provider Notes (Signed)
[facility] Emergency Department Provider Note MRN:  [id number]  Arrival date & time: 02/27/21     Chief Complaint   Covid Positive   History of Present Illness   Joshua Brady is a 45 y.o. year-old male with a history of hypertension presenting to the ED with chief complaint of COVID-positive.  5 days of body aches, malaise, cough, dry heaves today due to excessive coughing.  Has not had much to eat or drink for the past 5 days.  Denies shortness of breath, no chest pain, no abdominal pain.  Symptoms are moderate to severe, constant, no exacerbating relieving factors.  Review of Systems  A complete 10 system review of systems was obtained and all systems are negative except as noted in the HPI and PMH.   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   Low testosterone 04/29/2018   Lumbar degenerative disc disease 02/06/2020   Palpitations 03/03/2018   Sleep apnea 03/11/2018   SOB (shortness of breath) on exertion 03/03/2018   Viral illness 09/11/2019    Past Surgical History:  Procedure Laterality Date   Broken Arm     LEFT HEART CATH AND CORONARY ANGIOGRAPHY N/A 11/22/2020   Procedure: LEFT HEART CATH AND CORONARY ANGIOGRAPHY;  Surgeon: Troy Sine, MD;  Location: [place] [facility];  Service: Cardiovascular;  Laterality: N/A;   LIVER BIOPSY      Family History  Problem Relation Age of Onset   Alcohol abuse Father    Other Neg Hx        low testosterone    Social History   Socioeconomic History   Marital status: Married    Spouse name: Sang Khatib   Number of children: 2   Years of education:  Not on file   Highest education level: Not on file  Occupational History   Not on file  Tobacco Use   Smoking status: Never   Smokeless tobacco: Never  Vaping Use   Vaping Use: Never used  Substance and Sexual Activity   Alcohol use: Yes   Drug use: No   Sexual activity: Yes    Birth control/protection: None  Other Topics Concern   Not on file  Social History Narrative   Not on file   Social Determinants of Health   Financial Resource Strain: Not on file  Food Insecurity: Not on file  Transportation Needs: Not on file  Physical Activity: Not on file  Stress: Not on file  Social Connections: Not on file  Intimate Partner Violence: Not on file     Physical Exam   Vitals:   02/27/21 0545 02/27/21 0630  BP: (!) 138/99 (!) 148/80  Pulse: (!) 118 92  Resp: 18   Temp: 99.5 F (37.5 C)   SpO2: 94% 94%    CONSTITUTIONAL: Well-appearing, NAD NEURO:  Alert and oriented x 3, no focal deficits EYES:  eyes equal and reactive ENT/NECK:  no LAD, no JVD CARDIO: Tachycardic rate, well-perfused, normal S1 and S2 PULM:  CTAB no wheezing or rhonchi GI/GU:  normal bowel sounds, non-distended, non-tender MSK/SPINE:  No gross  deformities, no edema SKIN:  no rash, atraumatic PSYCH:  Appropriate speech and behavior  *Additional and/or pertinent findings included in MDM below  Diagnostic and Interventional Summary    EKG Interpretation  Date/Time:    Ventricular Rate:    PR Interval:    QRS Duration:   QT Interval:    QTC Calculation:   R Axis:     Text Interpretation:         Labs Reviewed  CBC  COMPREHENSIVE METABOLIC PANEL    DG Chest Portable 1 View  Final Result      Medications  ondansetron (ZOFRAN) injection 4 mg (has no administration in time range)  ketorolac (TORADOL) 15 MG/ML injection 15 mg (has no administration in time range)  sodium chloride 0.9 % bolus 1,000 mL (1,000 mLs Intravenous New Bag/Given 02/27/21 0715)     Procedures  /  Critical  Care Procedures  ED Course and Medical Decision Making  I have reviewed the triage vital signs, the nursing notes, and pertinent available records from the EMR.  Listed above are laboratory and imaging tests that I personally ordered, reviewed, and interpreted and then considered in my medical decision making (see below for details).  Suspect some mild dehydration in the setting of COVID-19.  Had a positive test 2 days ago.  Symptoms for 5 days.  Poor p.o. intake.  Overall well-appearing, no increased work of breathing, no hypoxia, mild tachycardia, clear lungs.  Providing fluids, symptomatic management, will check basic labs and reassess.  Anticipating discharge.  Signed out to oncoming provider at shift change.       Barth Kirks. [place]onia Small, [place] [email or web address]  Final Clinical Impressions(s) / ED Diagnoses     ICD-10-CM   1. COVID-19  U07.1       ED Discharge Orders     None        Discharge Instructions Discussed with and Provided to Patient:   Discharge Instructions   None       Maudie Flakes, MD 02/27/21 [phone]

## 2021-02-27 NOTE — ED Notes (Signed)
Patient Alert and oriented to baseline. Stable and ambulatory to baseline. Patient verbalized understanding of the discharge instructions.  Patient belongings were taken by the patient.   

## 2021-02-27 NOTE — ED Triage Notes (Signed)
Pt reports Covid positive per home test x 2 days ago. Symptoms began 5 days ago. Pt reports cough, congestion, headache. Pt states this morning he has had "dry heaves" and nose bleed.

## 2021-02-27 NOTE — ED Provider Notes (Signed)
Signout note 45 year old male with hypertension presenting to ER with concern for malaise, body aches, cough, dry heaving in the setting of COVID-19 positive test.  Patient was provided fluids, Toradol, basic labs checked to evaluate for dehydration.  If reassuring anticipate discharge.  7:15 AM received sign out from [facility] - f/u on labs, likely dc  7:53 AM labs are grossly within normal limits, noted mild leukocytosis.  Patient appears well at present, vitals are within normal limits.  Believe he is appropriate for discharge and outpatient management.  Recommend follow-up with primary doctor.   Lucrezia Starch, MD 02/27/21 [phone]

## 2021-02-28 ENCOUNTER — Encounter: Payer: Self-pay | Admitting: Family Medicine

## 2021-02-28 ENCOUNTER — Telehealth (INDEPENDENT_AMBULATORY_CARE_PROVIDER_SITE_OTHER): Payer: No Typology Code available for payment source | Admitting: Family Medicine

## 2021-02-28 ENCOUNTER — Telehealth: Payer: Self-pay | Admitting: General Practice

## 2021-02-28 DIAGNOSIS — U071 COVID-19: Secondary | ICD-10-CM | POA: Diagnosis not present

## 2021-02-28 MED ORDER — PREDNISONE 20 MG PO TABS
40.0000 mg | ORAL_TABLET | Freq: Every day | ORAL | 0 refills | Status: AC
Start: 2021-02-28 — End: 2021-03-05

## 2021-02-28 MED ORDER — GUAIFENESIN ER 600 MG PO TB12: 1200.0000 mg | ORAL_TABLET | Freq: Two times a day (BID) | ORAL | 2 refills | Status: DC

## 2021-02-28 NOTE — Progress Notes (Signed)
Virtual Video Visit via MyChart Note  I connected with  Joshua Brady on 02/28/21 at  3:00 PM EDT by the video enabled telemedicine application for MyChart, and verified that I am speaking with the correct person using two identifiers.   I introduced myself as a Designer, jewellery with the practice. We discussed the limitations of evaluation and management by telemedicine and the availability of in person appointments. The patient expressed understanding and agreed to proceed.  Participating parties in this visit include: The patient and the nurse practitioner listed.  The patient is: At home I am: In the office - [place]  Subjective:    CC:  Chief Complaint  Patient presents with   Covid Positive    HPI: Joshua Brady is a 45 y.o. year old male presenting today via [place] today for [place].  Patient started with symptoms Friday night - body aches, sore throat. Positive test on Tuesday. Symptoms continued to worsen over the next several days and he went to the ED yesterday and received fluids. Patient continues to have dry cough, fatigue, chest tightness with congestion, 8/10 sinus pain and pressure.  States when he was at the ED his chest x-ray was normal and lungs sound good.  He has been using his albuterol inhaler occasionally.  Reports he gets extremely winded and feels chest tightness up with coughing.  He denies any chest pain ,shortness of breath, lethargy, pain with inspiration, changes to bowel or bladder.    Past medical history, Surgical history, Family history not pertinant except as noted below, Social history, Allergies, and medications have been entered into the medical record, reviewed, and corrections made.   Review of Systems:  All review of systems negative except what is listed in the HPI   Objective:    General:  Speaking clearly in complete sentences. Absent shortness of breath noted.   Alert and oriented x3.   Normal judgment.  Absent  acute distress.   Impression and Recommendations:    1. COVID-19 Patient is out of window for antiviral.  Due to his significant sinus pressure and chest congestion with occasional wheezing, will go ahead and prescribe Mucinex and prednisone.  Prednisone is listed as an allergy however he states he took it last year and he did fine with it.  Educated on potential side effects versus true allergic reaction.  Patient informed not to take medication if he experiences any signs of allergic reaction.  Can continue over-the-counter Tylenol and ibuprofen, cough medicine.  Recommend supportive measures.  Continue to isolate until symptoms improve.Patient aware of signs/symptoms requiring further/urgent evaluation.    Prednione listed as allergy but states he did fine with it last year - educated on potential side effects vs allergic reaction  Follow-up if symptoms worsen or fail to improve.    I discussed the assessment and treatment plan with the patient. The patient was provided an opportunity to ask questions and all were answered. The patient agreed with the plan and demonstrated an understanding of the instructions.   The patient was advised to call back or seek an in-person evaluation if the symptoms worsen or if the condition fails to improve as anticipated.  I spent 20 minutes dedicated to the care of this patient on the date of this encounter to include pre-visit chart review of prior notes and results, face-to-face time with the patient, and post-visit ordering of testing as indicated.   Terrilyn Saver, NP

## 2021-02-28 NOTE — Patient Instructions (Signed)

## 2021-02-28 NOTE — Telephone Encounter (Signed)
Transition Care Management Follow-up Telephone Call Date of discharge and from where: 02/27/21 from [facility] med center. How have you been since you were released from the hospital? Patient had OV today. Any questions or concerns? No

## 2021-04-04 ENCOUNTER — Other Ambulatory Visit (HOSPITAL_BASED_OUTPATIENT_CLINIC_OR_DEPARTMENT_OTHER): Payer: Self-pay

## 2021-04-10 ENCOUNTER — Ambulatory Visit (INDEPENDENT_AMBULATORY_CARE_PROVIDER_SITE_OTHER): Payer: No Typology Code available for payment source | Admitting: Family Medicine

## 2021-04-10 ENCOUNTER — Encounter: Payer: Self-pay | Admitting: Family Medicine

## 2021-04-10 VITALS — BP 128/85 | HR 72 | Temp 97.9°F | Wt 275.1 lb

## 2021-04-10 DIAGNOSIS — R9389 Abnormal findings on diagnostic imaging of other specified body structures: Secondary | ICD-10-CM

## 2021-04-10 NOTE — Progress Notes (Signed)
Acute Office Visit  Subjective:    Patient ID: Joshua Brady, male    DOB: 02-03-1976, 45 y.o.   MRN: [id number]  Chief Complaint  Patient presents with   Results    C/T scan    HPI June 2022 - [place] (chest discomfort): CT scan with multilobular densities (approx 3x4 cm) in left middle mediastinum adjacent to pericardial margin. Thought to be benign but suggested 3 month repeat CT to confirm not changing.   04/10/2021 Patient her to discuss the CT results: Cardiac workup was okay in the hospital. States nobody told him about the finding on the CT scan. Reports that he recently got a letter in the mail from an [facility] saying there was an anomaly on his scan that was unrelated to his hospitalization and they recommended f/u with PCP.   Patient reports his wife is quite nervous so he is here to find out what the scan said and if he should have any further workup. States he has been feeling well overall. 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 He denies any recent chest pain, palpitations, trouble breathing, wheezing, coughing unexplained weight loss, lethargy. Never smoked.    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   Low testosterone 04/29/2018   Lumbar degenerative disc disease 02/06/2020   Palpitations 03/03/2018   Sleep apnea 03/11/2018   SOB (shortness of breath) on exertion 03/03/2018   Viral illness 09/11/2019    Past Surgical History:  Procedure Laterality Date   Broken Arm     LEFT HEART CATH AND CORONARY ANGIOGRAPHY N/A 11/22/2020   Procedure: LEFT HEART  CATH AND CORONARY ANGIOGRAPHY;  Surgeon: Troy Sine, MD;  Location: [place] [facility];  Service: Cardiovascular;  Laterality: N/A;   LIVER BIOPSY      Family History  Problem Relation Age of Onset   Alcohol abuse Father    Other Neg Hx        low testosterone    Social History   Socioeconomic History   Marital status: Married    Spouse name: Toshiyuki Fredell   Number of children: 2   Years of education: Not on file   Highest education level: Not on file  Occupational History   Not on file  Tobacco Use   Smoking status: Never   Smokeless tobacco: Never  Vaping Use   Vaping Use: Never used  Substance and Sexual Activity   Alcohol use: Yes   Drug use: No   Sexual activity: Yes    Birth control/protection: None  Other Topics Concern   Not on file  Social History Narrative   Not on file   Social Determinants of Health   Financial Resource Strain: Not on file  Food Insecurity: Not on file  Transportation Needs: Not on file  Physical Activity: Not on file  Stress: Not on file  Social Connections: Not on file  Intimate Partner Violence: Not on file    Outpatient Medications Prior to Visit  Medication Sig Dispense Refill   aspirin EC 81 MG  tablet Take 1 tablet (81 mg total) by mouth daily. Swallow whole. 90 tablet 3   atorvastatin (LIPITOR) 40 MG tablet Take 1 tablet (40 mg total) by mouth daily. 30 tablet 6   carvedilol (COREG) 6.25 MG tablet Take 1 tablet (6.25 mg total) by mouth 2 (two) times daily with a meal. 60 tablet 6   clobetasol cream (TEMOVATE) 1.30 % Apply 1 application on to the skin 2 (two) times daily. 30 g 1   Coenzyme Q10 (CO Q-10 PO) Take 1 tablet by mouth daily at 12 noon. Unknown strenght     fluticasone (FLONASE) 50 MCG/ACT nasal spray Place 2 sprays into each nostril once daily 16 g 6   guaiFENesin (MUCINEX) 600 MG 12 hr tablet Take 2 tablets (1,200 mg total) by mouth 2 (two) times daily. 30 tablet 2   OVER THE COUNTER MEDICATION Take 1 tablet  by mouth daily at 12 noon. Beet root/Unknown strength     No facility-administered medications prior to visit.    Allergies  Allergen Reactions   Other     Ants - see notes from ER visit 02/2018   Prednisone    Topiramate     Anxiety, dizzy, depressed, suicidal thoughts, agitated    Review of Systems All review of systems negative except what is listed in the HPI     Objective:    Physical Exam Vitals reviewed.  Constitutional:      Appearance: Normal appearance.  HENT:     Head: Normocephalic and atraumatic.  Cardiovascular:     Rate and Rhythm: Normal rate and regular rhythm.     Pulses: Normal pulses.     Heart sounds: Normal heart sounds.  Pulmonary:     Effort: Pulmonary effort is normal. No respiratory distress.     Breath sounds: Normal breath sounds. No wheezing, rhonchi or rales.  Skin:    General: Skin is warm and dry.  Neurological:     General: No focal deficit present.     Mental Status: He is alert and oriented to person, place, and time. Mental status is at baseline.  Psychiatric:        Mood and Affect: Mood normal.        Behavior: Behavior normal.        Thought Content: Thought content normal.        Judgment: Judgment normal.    BP 128/85 (BP Location: Left Arm, Patient Position: Sitting, Cuff Size: Large)   Pulse 72   Temp 97.9 F (36.6 C) (Oral)   Wt 275 lb 1.3 oz (124.8 kg)   BMI 37.31 kg/m  Wt Readings from Last 3 Encounters:  04/10/21 275 lb 1.3 oz (124.8 kg)  02/27/21 270 lb (122.5 kg)  01/02/21 281 lb (127.5 kg)    Health Maintenance Due  Topic Date Due   Hepatitis C Screening  Never done   COLONOSCOPY (Pts 45-57yrs Insurance coverage will need to be confirmed)  Never done    There are no preventive care reminders to display for this patient.   Lab Results  Component Value Date   TSH 1.87 03/03/2018   Lab Results  Component Value Date   WBC 14.3 (H) 02/27/2021   HGB 13.5 02/27/2021   HCT 40.3 02/27/2021   MCV 85.9  02/27/2021   PLT 343 02/27/2021   Lab Results  Component Value Date   NA 134 (L) 02/27/2021   K 4.4 02/27/2021   CO2 27 02/27/2021   GLUCOSE 147 (H)  02/27/2021   BUN 10 02/27/2021   CREATININE 0.92 02/27/2021   BILITOT 0.4 02/27/2021   ALKPHOS 97 02/27/2021   AST 38 02/27/2021   ALT 57 (H) 02/27/2021   PROT 8.3 (H) 02/27/2021   ALBUMIN 3.5 02/27/2021   CALCIUM 9.5 02/27/2021   ANIONGAP 9 02/27/2021   Lab Results  Component Value Date   CHOL 170 01/02/2021   Lab Results  Component Value Date   HDL 36 (L) 01/02/2021   Lab Results  Component Value Date   LDLCALC 105 (H) 01/02/2021   Lab Results  Component Value Date   TRIG 163 (H) 01/02/2021   Lab Results  Component Value Date   CHOLHDL 4.7 01/02/2021   Lab Results  Component Value Date   HGBA1C 6.3 (H) 11/22/2020       Assessment & Plan:   1. Abnormal CT scan, chest Discussed findings with patient. He would like to proceed with repeat CT to ensure no changes. Currently asymptomatic. Patient aware of signs/symptoms requiring further/urgent evaluation.   - CT Angio Chest W/Cm &/Or Wo Cm; Future   Follow-up pending results or as needed. Please contact office for sooner follow-up if symptoms do not improve or worsen. Seek emergency care if symptoms become severe.   Purcell Nails Olevia Bowens, DNP, FNP-C

## 2021-04-10 NOTE — Patient Instructions (Signed)
Ordering CT to check for any changes. Insurance will have to approve and then someone will call you to schedule. We will follow-up based on results.  Please contact office for sooner follow-up if symptoms do not improve or worsen. Seek emergency care if symptoms become severe.

## 2021-04-17 ENCOUNTER — Other Ambulatory Visit: Payer: No Typology Code available for payment source

## 2021-04-18 ENCOUNTER — Other Ambulatory Visit: Payer: Self-pay

## 2021-04-18 ENCOUNTER — Ambulatory Visit (INDEPENDENT_AMBULATORY_CARE_PROVIDER_SITE_OTHER): Payer: No Typology Code available for payment source

## 2021-04-18 DIAGNOSIS — R9389 Abnormal findings on diagnostic imaging of other specified body structures: Secondary | ICD-10-CM

## 2021-04-18 LAB — I-STAT CREATININE (MANUAL ENTRY): Creatinine, Ser: 0.9 (ref 0.50–1.10)

## 2021-04-18 IMAGING — CT CT ANGIO CHEST
3 of 10 series · 17 of 36 positions shown · IV contrast (omnipaque)
Comparison: Chest CT-[DATE]; CT abdomen pelvis-[DATE]

CLINICAL DATA: Evaluate multilobulated density adjacent to the left
mediastinum on previous chest CT

EXAM:
CT ANGIOGRAPHY CHEST WITH CONTRAST
TECHNIQUE: Multidetector CT imaging of the chest was performed using the
standard protocol during bolus administration of intravenous
contrast. Multiplanar CT image reconstructions and MIPs were
obtained to evaluate the vascular anatomy.
CONTRAST:  100mL OMNIPAQUE IOHEXOL 350 MG/ML SOLN

[Series 6: pe lung · axial · 0.90mm/px · z∈[+1223,+1371]mm · 3 of 149 slices shown]
[im 38/149  mediastinal]
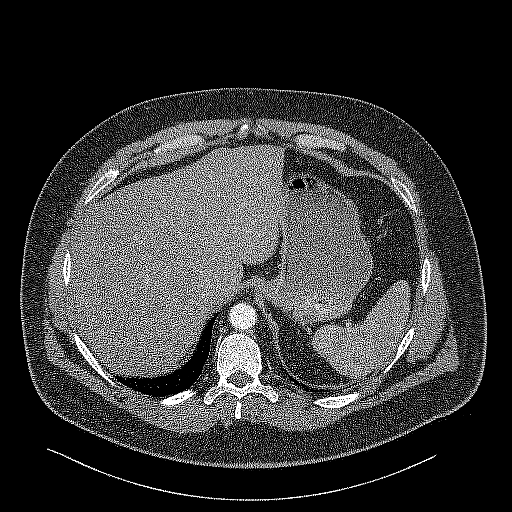
[im 75/149  mediastinal]
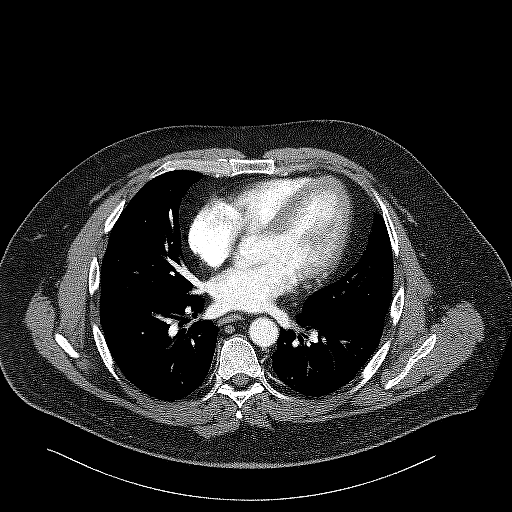
[im 112/149  mediastinal]
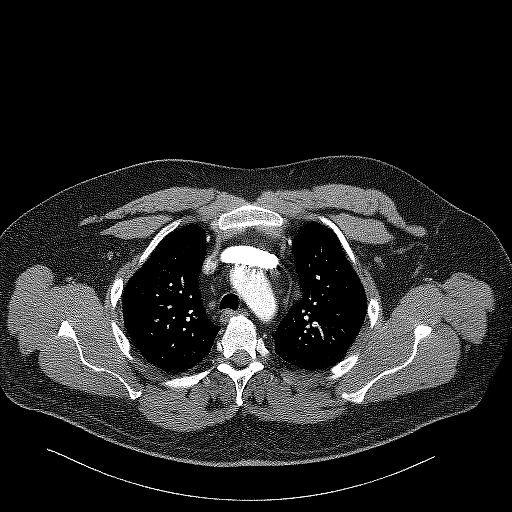

[Series 7: pe thins · axial · 0.90mm/px · z∈[+1169,+1424]mm · 13 of 373 slices shown]
[im 27/373  lung]
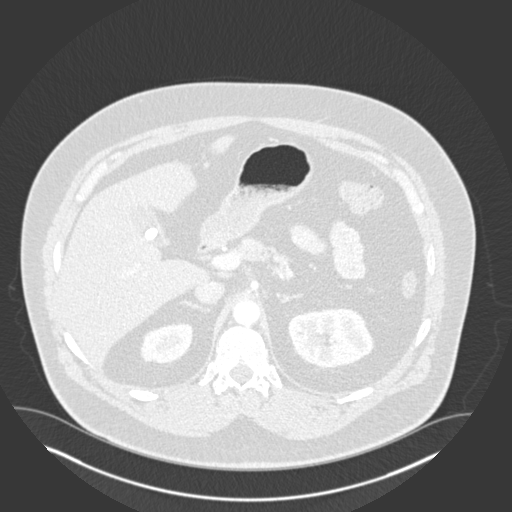
[im 54/373  mediastinal]
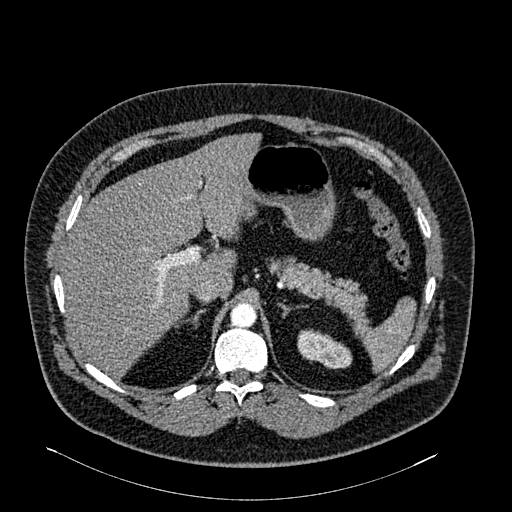
[im 80/373  lung]
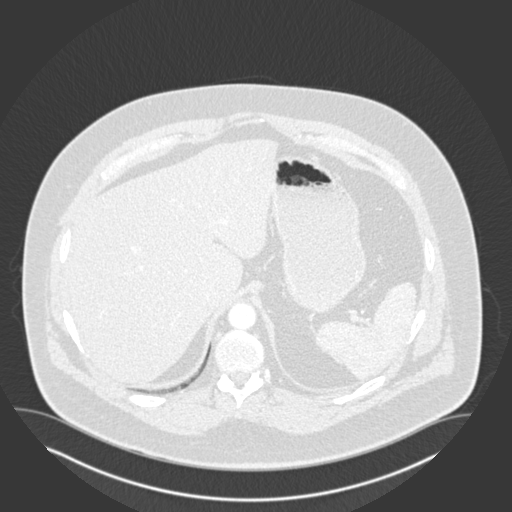
[im 107/373  mediastinal]
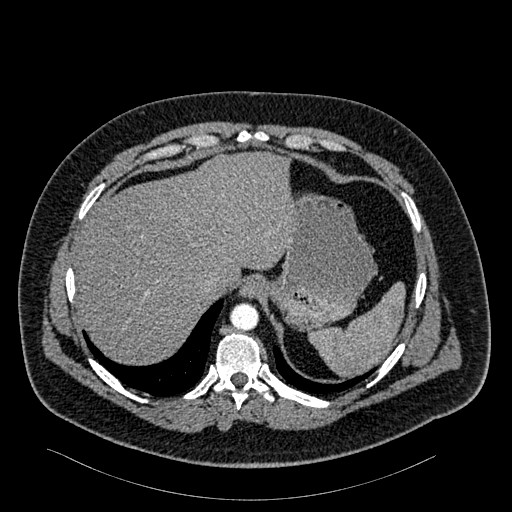
[im 133/373  lung]
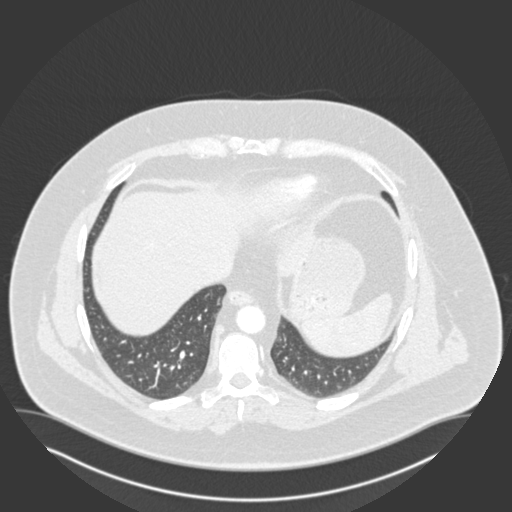
[im 160/373  mediastinal]
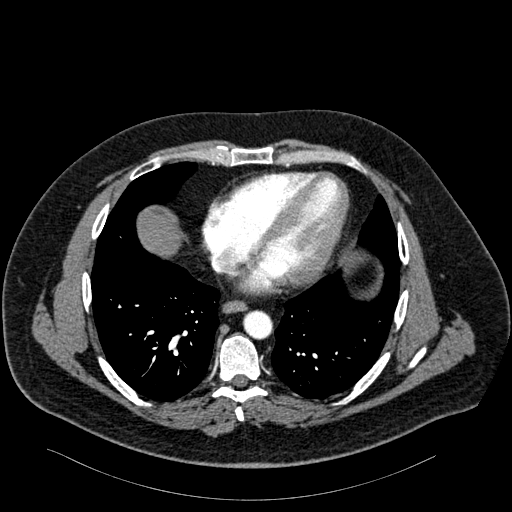
[im 187/373  lung]
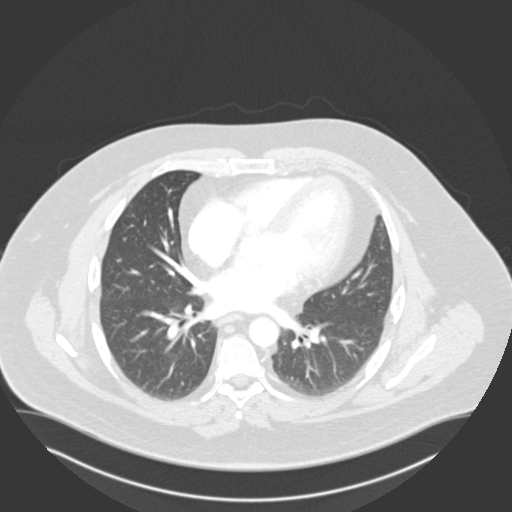
[im 213/373  mediastinal]
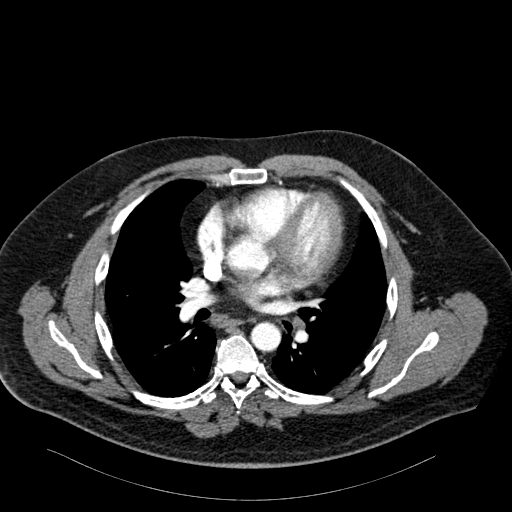
[im 240/373  lung]
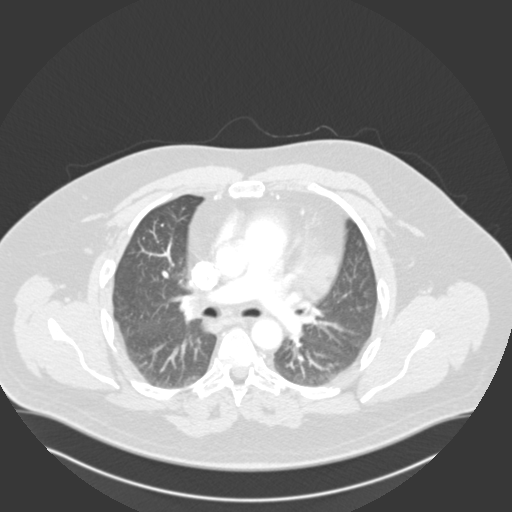
[im 266/373  mediastinal]
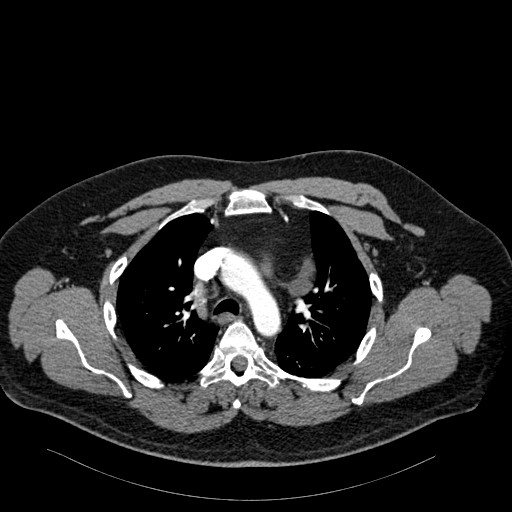
[im 293/373  lung]
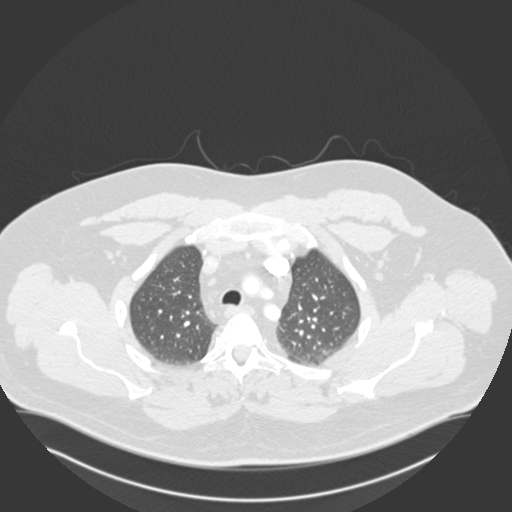
[im 319/373  mediastinal]
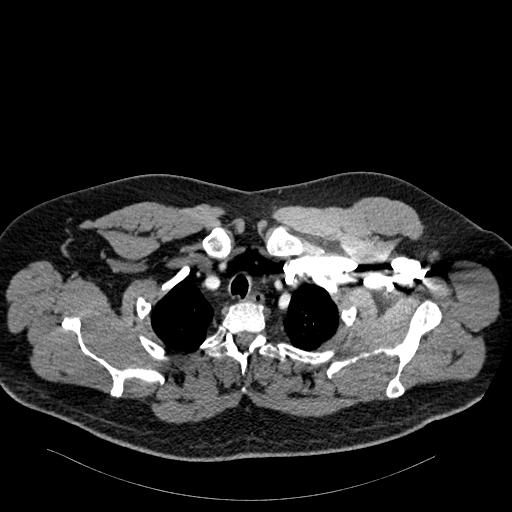
[im 346/373  lung]
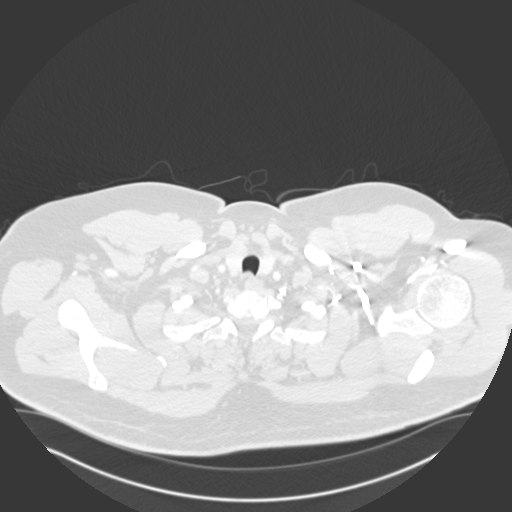

[Series 8: pe coronal mpr · coronal · 0.59mm/px · 1 of 151 slices shown]
[im 76/151  mediastinal]
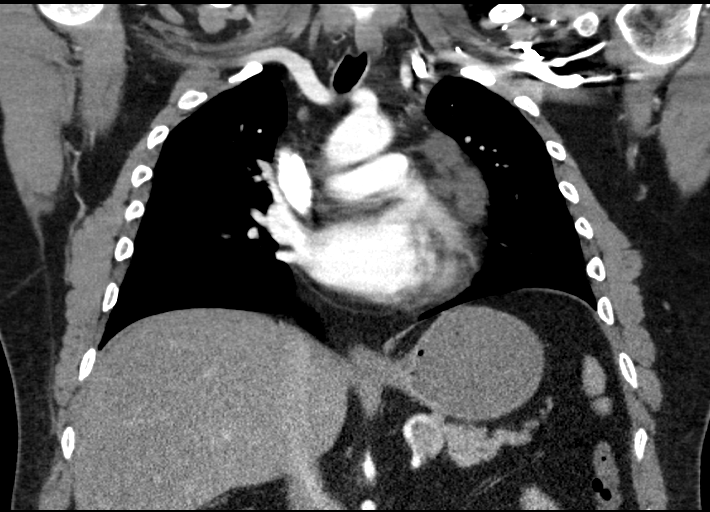

[17 of 36 positions shown; findings below may reference images not displayed]

FINDINGS: Vascular Findings:

There is adequate opacification of the pulmonary arterial system
with the main pulmonary artery measuring 249 Hounsfield units. There
are no discrete filling defects within the pulmonary arterial tree
to suggest pulmonary embolism. Normal caliber of the main pulmonary
artery.

Cardiomegaly.  No pericardial effusion.

No evidence of thoracic aortic aneurysm or dissection on this
nongated examination.

Conventional configuration of the aortic arch. The branch vessels of
the aortic arch appear widely patent throughout their imaged
courses. The descending thoracic aorta is of normal caliber.

Review of the MIP images confirms the above findings.

----------------------------------------------------------------------------------

Nonvascular Findings:

Mediastinum/Lymph Nodes: Prominent though non pathologically
enlarged distal left periaortic lymph node measures 0.8 cm in
greatest short axis diameter (image 92, series 5), unchanged
compared to remote abdominal CT performed [DATE], favored to be
reactive in etiology. Scattered mediastinal lymph nodes are not
enlarged by size criteria. No bulky mediastinal, hilar axillary
lymphadenopathy.

Macrolobulated cystic lesion adjacent to the left atrial appendage
measuring 5.9 x 4.5 x 1.8 cm (coronal image 75, series 8; axial
image 51, series 5) has likely slightly increased in size compared
to the [DATE] examination, previously, 5.6 x 4.7 x 1.5 cm (by my
direct remeasurement), though admittedly, exact measurements are
difficult secondary to the serpiginous shape of the cystic lesion.
There is no definitive enhancement or wall thickening.

Lungs/Pleura: No focal airspace opacities. No pleural effusion or
pneumothorax. The central pulmonary airways appear widely patent. No
discrete pulmonary nodules.

Upper abdomen: Limited early arterial phase evaluation of the upper
abdomen demonstrates diffuse decreased attenuation of the hepatic
parenchyma with mild nodularity of the hepatic contour as could be
seen in the setting of hepatic steatosis with early cirrhotic
change. There is a punctate (subcentimeter) hypoattenuating lesion
within the anterior aspect the right lobe of the liver (image 116,
series 5), which is too small to accurately characterize though may
represent a hepatic cyst. Two layering gallstones are seen within
the neck of an otherwise normal-appearing gallbladder.

Musculoskeletal: Regional soft tissues appear normal. Normal
appearance of the thyroid gland. No acute or aggressive osseous
abnormalities.
IMPRESSION: 1. Suspected slight increase in size cystic lesion adjacent to the
left atrial appendage. Again, while potentially benign etiology,
such as a pericardial cyst or lymphatic malformation, given its
persistence and potential slight increase in size, further
evaluation with chest MRI could be performed as indicated.
2. Similar findings of cardiomegaly without superimposed acute
cardiopulmonary disease. Specifically, no evidence of pulmonary
embolism.
3. Suspected hepatic steatosis with early cirrhotic change.
Correlation with LFTs is advised.
4. Cholelithiasis without evidence of cholecystitis.

## 2021-04-18 MED ORDER — IOHEXOL 350 MG/ML SOLN
100.0000 mL | Freq: Once | INTRAVENOUS | Status: AC | PRN
  Administered 2021-04-18: 100 mL via INTRAVENOUS

## 2021-04-18 NOTE — Progress Notes (Unsigned)
Per CMA telephone call, patient would like to proceed with Chest MRI to further evaluate lesion. Order placed.   Purcell Nails Olevia Bowens, DNP, FNP-C

## 2021-04-25 NOTE — Addendum Note (Signed)
Addended by: Caleen Jobs B on: 04/25/2021 09:38 AM   Modules accepted: Orders

## 2021-04-28 ENCOUNTER — Other Ambulatory Visit: Payer: Self-pay

## 2021-04-28 ENCOUNTER — Ambulatory Visit (INDEPENDENT_AMBULATORY_CARE_PROVIDER_SITE_OTHER): Payer: No Typology Code available for payment source | Admitting: Family Medicine

## 2021-04-28 ENCOUNTER — Encounter: Payer: Self-pay | Admitting: Family Medicine

## 2021-04-28 DIAGNOSIS — I318 Other specified diseases of pericardium: Secondary | ICD-10-CM | POA: Diagnosis not present

## 2021-04-28 NOTE — Progress Notes (Signed)
Joshua Brady - 45 y.o. male MRN [id number]  Date of birth: 1976-02-13  Subjective Chief Complaint  Patient presents with   Results    HPI [name] is a 45 year old male here today for follow-up of recent CT scan of his chest.  CT shows lobulated lesion adjacent to the left atrial appendage.  Unclear etiology.  He does report having intermittent episodes of chest pain and dyspnea over the past several months.  Expensive he does have an upcoming MRI of the chest for further evaluation of this.  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   Low testosterone 04/29/2018   Lumbar degenerative disc disease 02/06/2020   Palpitations 03/03/2018   Sleep apnea 03/11/2018   SOB (shortness of breath) on exertion 03/03/2018   Viral illness 09/11/2019    Past Surgical History:  Procedure Laterality Date   Broken Arm     LEFT HEART CATH AND CORONARY ANGIOGRAPHY N/A 11/22/2020   Procedure: LEFT HEART CATH AND CORONARY ANGIOGRAPHY;  Surgeon: Troy Sine, MD;  Location: [facility];  Service: Cardiovascular;  Laterality: N/A;   LIVER BIOPSY      Social History   Socioeconomic History   Marital status: Married    Spouse name: Levoy Geisen   Number of children: 2   Years of education: Not on file   Highest education level: Not on file  Occupational History   Not on file  Tobacco Use   Smoking status: Never    Smokeless tobacco: Never  Vaping Use   Vaping Use: Never used  Substance and Sexual Activity   Alcohol use: Yes   Drug use: No   Sexual activity: Yes    Birth control/protection: None  Other Topics Concern   Not on file  Social History Narrative   Not on file   Social Determinants of Health   Financial Resource Strain: Not on file  Food Insecurity: Not on file  Transportation Needs: Not on file  Physical Activity: Not on file  Stress: Not on file  Social Connections: Not on file    Family History  Problem Relation Age of Onset   Alcohol abuse Father    Other Neg Hx        low testosterone    Health Maintenance  Topic Date Due   COVID-19 Vaccine (1) Never done   Hepatitis C Screening  Never done   COLONOSCOPY (Pts 45-68yrs Insurance coverage will need to be confirmed)  Never done   INFLUENZA VACCINE  09/12/2021 (Originally 01/13/2021)   Pneumococcal Vaccine 27-48 Years old (1 - PCV) 04/10/2022 (Originally 08/28/1979)   TETANUS/TDAP  10/31/2025   HIV Screening  Completed   HPV VACCINES  Aged Out     ----------------------------------------------------------------------------------------------------------------------------------------------------------------------------------------------------------------- Physical Exam BP (!) 143/82 (BP Location: Right Arm, Patient Position: Sitting, Cuff Size: Large)  Pulse 77   Ht 6' (1.829 m)   Wt 283 lb (128.4 kg)   SpO2 96%   BMI 38.38 kg/m   Physical Exam Constitutional:      Appearance: Normal appearance.  Eyes:     General: No scleral icterus. Cardiovascular:     Rate and Rhythm: Normal rate and regular rhythm.  Pulmonary:     Effort: Pulmonary effort is normal.     Breath sounds: Normal breath sounds.  Musculoskeletal:     Cervical back: Neck supple.  Neurological:     General: No focal deficit present.     Mental Status: He is alert.  Psychiatric:        Mood and Affect: Mood normal.        Behavior:  Behavior normal.    ------------------------------------------------------------------------------------------------------------------------------------------------------------------------------------------------------------------- Assessment and Plan  Pericardial mass Unclear etiology.  MRI of the chest ordered for further evaluation.  Do question of this may be contributing to his chest pain and dyspnea due to compression of adjacent structures.   No orders of the defined types were placed in this encounter.   No follow-ups on file.    This visit occurred during the SARS-CoV-2 public health emergency.  Safety protocols were in place, including screening questions prior to the visit, additional usage of staff PPE, and extensive cleaning of exam room while observing appropriate contact time as indicated for disinfecting solutions.

## 2021-04-28 NOTE — Assessment & Plan Note (Addendum)
Unclear etiology.  MRI of the chest ordered for further evaluation.  Do question of this may be contributing to his chest pain and dyspnea due to compression of adjacent structures.

## 2021-05-12 ENCOUNTER — Ambulatory Visit (HOSPITAL_COMMUNITY): Admission: RE | Admit: 2021-05-12 | Payer: No Typology Code available for payment source | Source: Ambulatory Visit

## 2021-05-15 ENCOUNTER — Other Ambulatory Visit: Payer: Self-pay

## 2021-05-21 ENCOUNTER — Other Ambulatory Visit: Payer: Self-pay

## 2021-05-21 ENCOUNTER — Ambulatory Visit (HOSPITAL_COMMUNITY)
Admission: RE | Admit: 2021-05-21 | Discharge: 2021-05-21 | Disposition: A | Discharge status: Home / Self Care | Payer: No Typology Code available for payment source | Source: Ambulatory Visit | Attending: Family Medicine | Admitting: Family Medicine

## 2021-05-21 DIAGNOSIS — R9389 Abnormal findings on diagnostic imaging of other specified body structures: Secondary | ICD-10-CM | POA: Insufficient documentation

## 2021-05-21 IMAGING — MR MR CHEST MEDIASTINUM WO/W CM
22 series · 40 of 40 positions shown · IV contrast (gadavist)
Comparison: CT chest angiogram, [DATE], [DATE]

CLINICAL DATA: Evaluate cystic lesion adjacent to the left atrial
appendage

EXAM:
MR CHEST WITH AND WITHOUT CONTRAST
TECHNIQUE: Multiplanar, multisequence MR imaging of the chest was performed
before and after the administration of intravenous contrast.
CONTRAST:  10mL GADAVIST GADOBUTROL 1 MMOL/ML IV SOLN

[Series 3: t2_trufi_tra_p2_bh · axial · 8.0mm · 0.66mm/px · z∈[-298,-78]mm · 2 of 23 slices shown]
[im 1/23]
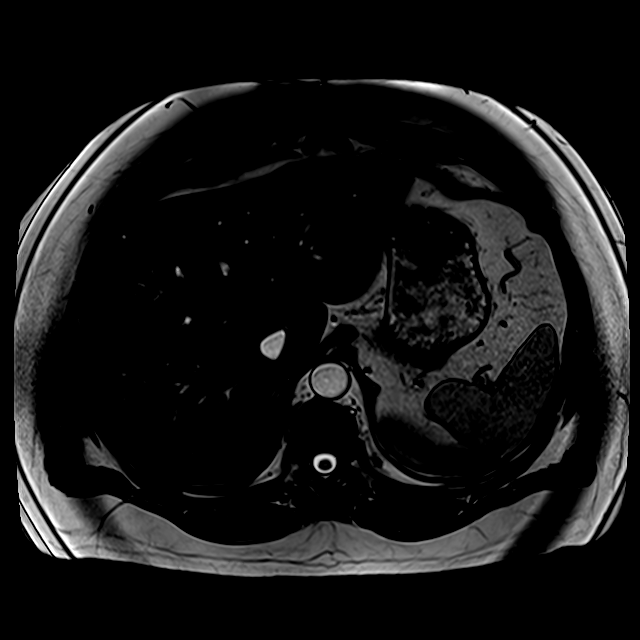
[im 23/23]
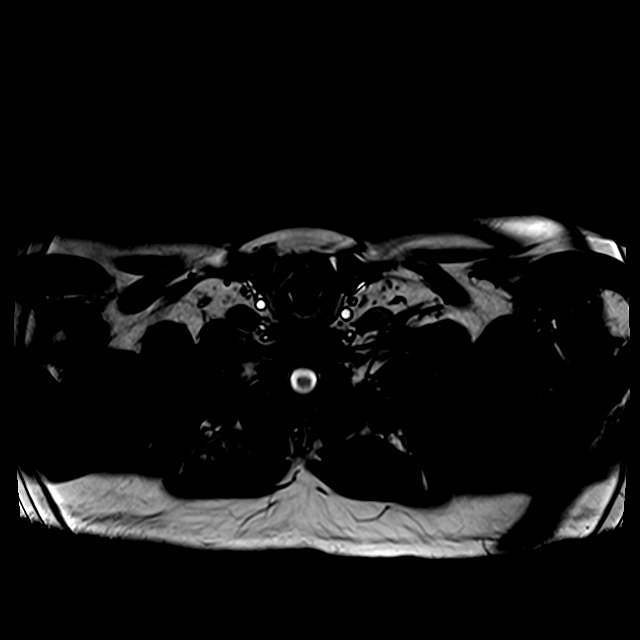

[Series 4: axial_db_haste_loc · axial · 7.0mm · 1.72mm/px · z∈[-296,-86]mm · 2 of 25 slices shown]
[im 1/25]
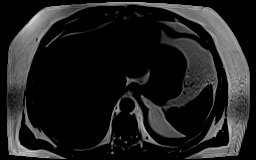
[im 25/25]
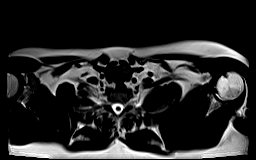

[Series 5: t2_haste_cor_p3_320_bh_insp · coronal · 8.0mm · 1.25mm/px · 2 of 36 slices shown]
[im 1/36]
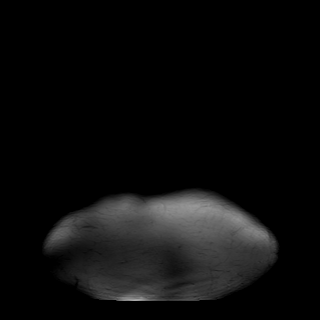
[im 36/36]
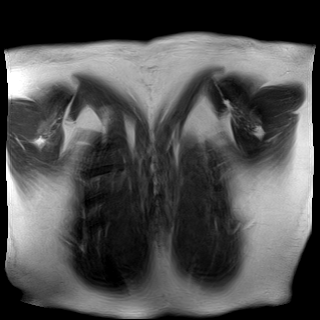

[Series 6: t2_trufi_single-shot-free breathing · axial · 4.5mm · 1.64mm/px · z∈[-297,-63]mm · 2 of 53 slices shown (1 of 2)]
[im 1/53]
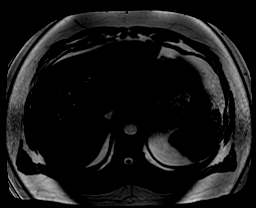
[im 53/53]
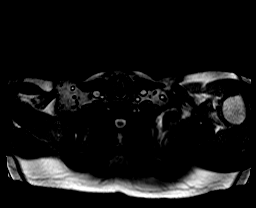

[Series 7: t2_trufi_single-shot-free breathing · coronal · 4.5mm · 1.80mm/px · 2 of 58 slices shown (2 of 2)]
[im 1/58]
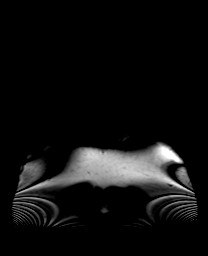
[im 58/58]
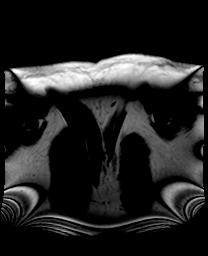

[Series 8: t2_blade_fs_tra_p2_mbh_insp · axial · 5.5mm · 1.56mm/px · 1 of 25 slices shown (1 of 3)]
[im 1/25]
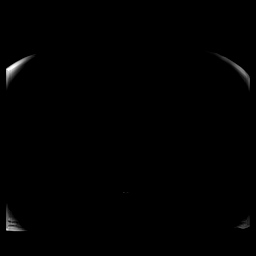

[Series 9: t2_blade_fs_tra_p2_mbh_insp · axial · 5.5mm · 1.56mm/px · 1 of 35 slices shown (2 of 3)]
[im 1/35]
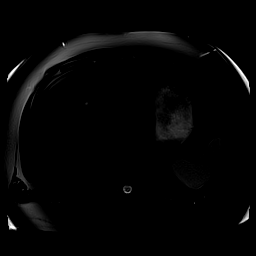

[Series 10: t2_haste_fs_tra_p2_320_mbh_insp_320 · axial · 5.5mm · 1.25mm/px · 1 of 36 slices shown]
[im 1/36]
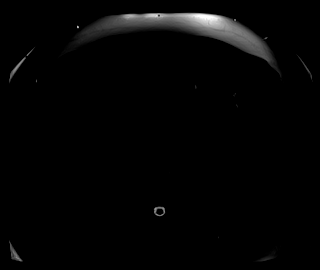

[Series 11: t2_blade_fs_tra_p2_mbh_insp · axial · 5.5mm · 1.56mm/px · 1 of 35 slices shown (3 of 3)]
[im 1/35]
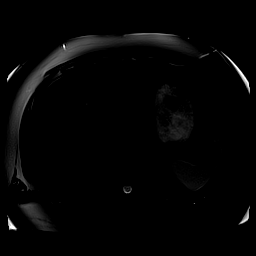

[Series 12: DWI · axial · 5.5mm · 1.49mm/px · z∈[-286,-75]mm · 3 of 99 slices shown (1 of 2)]
[im 1/99]
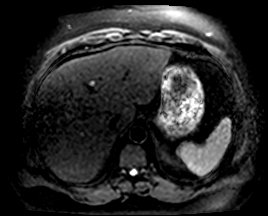
[im 50/99]
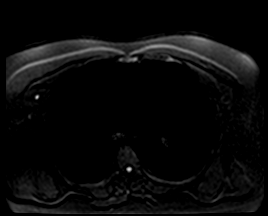
[im 99/99]
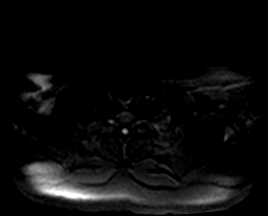

[Series 13: DWI · axial · 5.5mm · 1.49mm/px · 1 of 33 slices shown (2 of 2)]
[im 1/33]
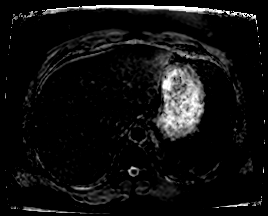

[Series 14: t1_vibe_opp-in_tra_p4_bh · axial · 3.0mm · 1.19mm/px · z∈[-280,-67]mm · 2 of 72 slices shown (1 of 2)]
[im 1/72]
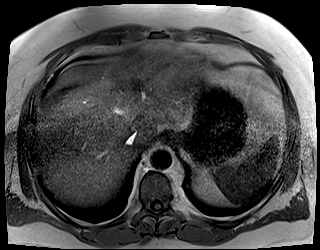
[im 72/72]
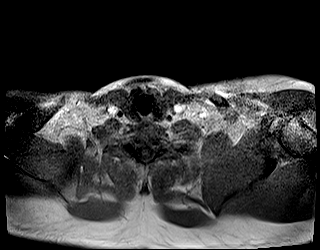

[Series 14: t1_vibe_opp-in_tra_p4_bh · axial · 3.0mm · 1.19mm/px · z∈[-280,-67]mm · 2 of 72 slices shown (2 of 2)]
[im 1/72]
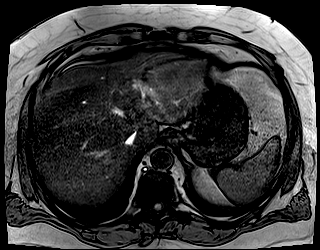
[im 72/72]
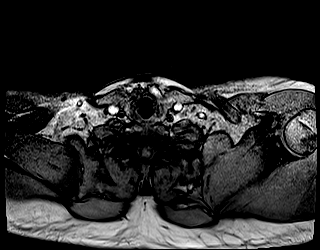

[Series 15: t1_vibe_fs_tra_p4_bh_pre · axial · 3.0mm · 1.19mm/px · z∈[-280,-67]mm · 2 of 72 slices shown]
[im 1/72]
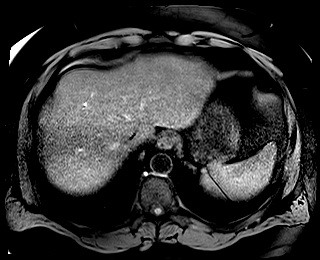
[im 72/72]
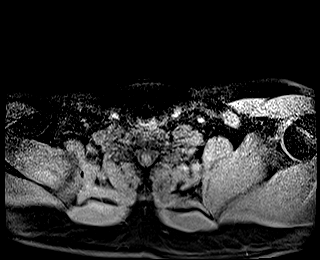

[Series 17: t1_vibe_fs_tra_p4_bh_post · axial · 3.0mm · 1.19mm/px · z∈[-280,-67]mm · 2 of 72 slices shown (1 of 4)]
[im 1/72]
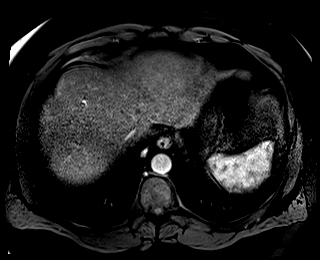
[im 72/72]
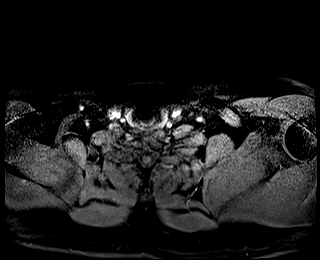

[Series 18: t1_vibe_fs_tra_p4_bh_post_sub · axial · 3.0mm · 1.19mm/px · z∈[-280,-67]mm · 2 of 72 slices shown (1 of 4)]
[im 1/72]
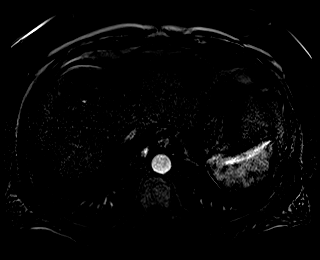
[im 72/72]
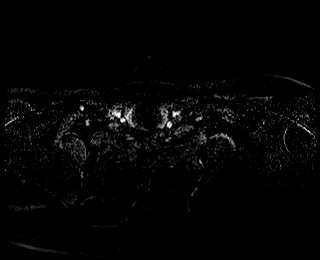

[Series 19: t1_vibe_fs_tra_p4_bh_post · axial · 3.0mm · 1.19mm/px · z∈[-280,-67]mm · 2 of 72 slices shown (2 of 4)]
[im 1/72]
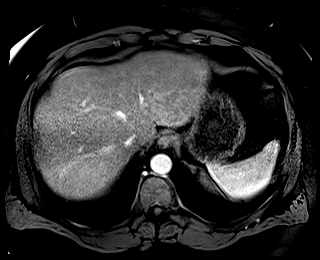
[im 72/72]
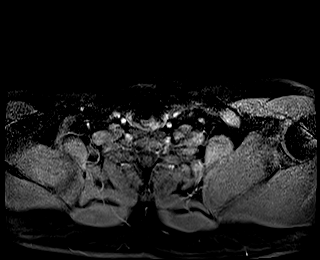

[Series 20: t1_vibe_fs_tra_p4_bh_post_sub · axial · 3.0mm · 1.19mm/px · z∈[-280,-67]mm · 2 of 72 slices shown (2 of 4)]
[im 1/72]
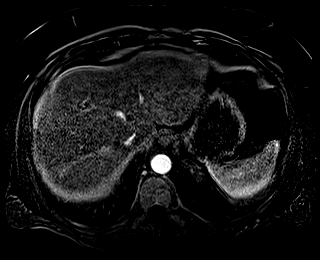
[im 72/72]
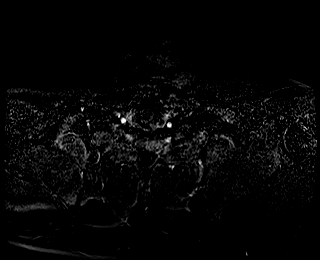

[Series 21: t1_vibe_fs_tra_p4_bh_post · axial · 3.0mm · 1.19mm/px · z∈[-280,-67]mm · 2 of 72 slices shown (3 of 4)]
[im 1/72]
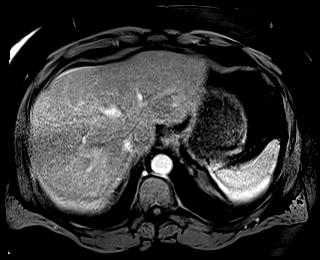
[im 72/72]
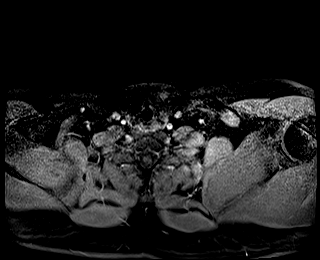

[Series 22: t1_vibe_fs_tra_p4_bh_post_sub · axial · 3.0mm · 1.19mm/px · z∈[-280,-67]mm · 2 of 72 slices shown (3 of 4)]
[im 1/72]
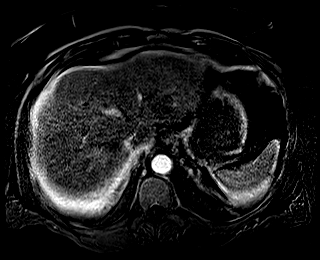
[im 72/72]
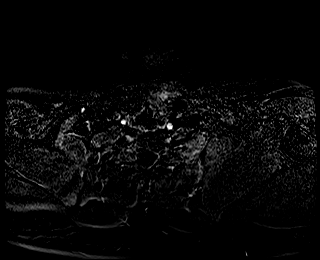

[Series 23: t1_vibe_fs_tra_p4_bh_post · axial · 3.0mm · 1.19mm/px · z∈[-280,-67]mm · 2 of 72 slices shown (4 of 4)]
[im 1/72]
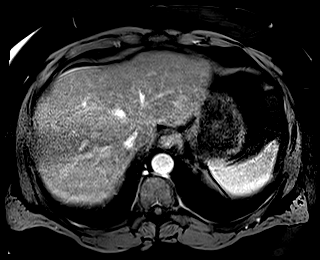
[im 72/72]
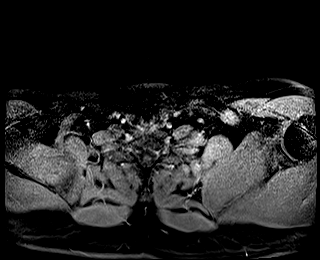

[Series 24: t1_vibe_fs_tra_p4_bh_post_sub · axial · 3.0mm · 1.19mm/px · z∈[-280,-67]mm · 2 of 72 slices shown (4 of 4)]
[im 1/72]
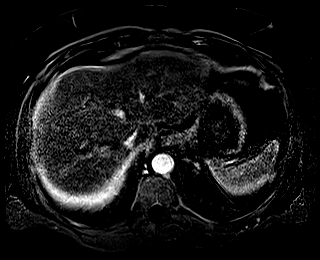
[im 72/72]
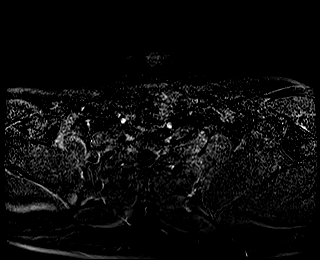

[40 of 40 positions shown; findings below may reference images not displayed]

FINDINGS: Cardiovascular: No significant vascular findings. Normal heart size.
No pericardial effusion.

Mediastinum/Nodes: No enlarged mediastinal, hilar, or axillary lymph
nodes. There is a redemonstrated, multilobulated cystic lesion in
the left middle mediastinum, adjacent to the left atrial appendage,
pulmonary artery, and AP window. Unfortunately, evaluation of this
lesion is somewhat limited by cardiac motion artifact, however it
demonstrates intrinsic fluid signal and measures approximately 5.3 x
3.9 cm and is without appreciable associated contrast enhancement
(series 8, image 9). Thyroid gland, trachea, and esophagus
demonstrate no significant findings.

Limited lungs/Pleura: No obvious abnormality.  No pleural effusion.

Upper Abdomen: No acute abnormality.

Musculoskeletal: No chest wall mass or suspicious bone lesions
identified.
IMPRESSION: There is a redemonstrated, multilobulated cystic lesion in the left
middle mediastinum. Unfortunately, evaluation of this lesion is
somewhat limited by cardiac motion artifact, however it demonstrates
intrinsic fluid signal and is without appreciable associated
contrast enhancement. This most likely reflects a pericardial cyst
or lymphovascular malformation and is almost certainly benign.

## 2021-05-21 MED ORDER — GADOBUTROL 1 MMOL/ML IV SOLN
10.0000 mL | Freq: Once | INTRAVENOUS | Status: AC | PRN
  Administered 2021-05-21: 10 mL via INTRAVENOUS

## 2021-05-26 ENCOUNTER — Other Ambulatory Visit: Payer: Self-pay | Admitting: Family Medicine

## 2021-05-26 DIAGNOSIS — R9389 Abnormal findings on diagnostic imaging of other specified body structures: Secondary | ICD-10-CM

## 2021-05-27 ENCOUNTER — Telehealth: Payer: Self-pay | Admitting: Family Medicine

## 2021-05-27 NOTE — Telephone Encounter (Signed)
Patient had mentioned that he may want a second opinion with a new cardiologist. Please ask him if he has a preference in a specific provider, system, location, etc. Thanks!

## 2021-05-28 NOTE — Telephone Encounter (Signed)
Task completed. Per patient, he would like to stay within [facility] system. He did see a cardiologist located at [facility] in [place], who completed his heart cath procedure. He is okay with seeing that provider. Patient did not disclose the provider's name.

## 2021-05-30 ENCOUNTER — Telehealth: Payer: Self-pay | Admitting: Cardiology

## 2021-05-30 DIAGNOSIS — I318 Other specified diseases of pericardium: Secondary | ICD-10-CM

## 2021-05-30 NOTE — Telephone Encounter (Signed)
°  Referral received from pt's pcp due to Abnormal MRI, spoke with pt and pt needs to be seen asap, pt is currently scheduled on 07/16/21, but he insisted to be seen sooner

## 2021-06-02 NOTE — Telephone Encounter (Signed)
Referral was placed at this time and the patient is aware of Dr. Wendy Poet recommendations. He verbalizes understanding.

## 2021-06-13 ENCOUNTER — Encounter: Payer: Self-pay | Admitting: Family Medicine

## 2021-06-15 ENCOUNTER — Emergency Department (HOSPITAL_BASED_OUTPATIENT_CLINIC_OR_DEPARTMENT_OTHER)
Admission: EM | Admit: 2021-06-15 | Discharge: 2021-06-15 | Disposition: A | Discharge status: Home / Self Care | Payer: No Typology Code available for payment source | Attending: Emergency Medicine | Admitting: Emergency Medicine

## 2021-06-15 ENCOUNTER — Encounter (HOSPITAL_BASED_OUTPATIENT_CLINIC_OR_DEPARTMENT_OTHER): Payer: Self-pay | Admitting: Emergency Medicine

## 2021-06-15 ENCOUNTER — Emergency Department (HOSPITAL_BASED_OUTPATIENT_CLINIC_OR_DEPARTMENT_OTHER): Payer: No Typology Code available for payment source

## 2021-06-15 ENCOUNTER — Other Ambulatory Visit: Payer: Self-pay

## 2021-06-15 DIAGNOSIS — R079 Chest pain, unspecified: Secondary | ICD-10-CM | POA: Diagnosis present

## 2021-06-15 DIAGNOSIS — R0602 Shortness of breath: Secondary | ICD-10-CM | POA: Insufficient documentation

## 2021-06-15 DIAGNOSIS — R5383 Other fatigue: Secondary | ICD-10-CM | POA: Diagnosis not present

## 2021-06-15 DIAGNOSIS — R0789 Other chest pain: Secondary | ICD-10-CM | POA: Insufficient documentation

## 2021-06-15 DIAGNOSIS — Z7982 Long term (current) use of aspirin: Secondary | ICD-10-CM | POA: Diagnosis not present

## 2021-06-15 LAB — COMPREHENSIVE METABOLIC PANEL
ALT: 48 U/L — ABNORMAL HIGH (ref 0–44)
AST: 36 U/L (ref 15–41)
Albumin: 4 g/dL (ref 3.5–5.0)
Alkaline Phosphatase: 92 U/L (ref 38–126)
Anion gap: 9 (ref 5–15)
BUN: 12 mg/dL (ref 6–20)
CO2: 22 mmol/L (ref 22–32)
Calcium: 9.4 mg/dL (ref 8.9–10.3)
Chloride: 104 mmol/L (ref 98–111)
Creatinine, Ser: 0.68 mg/dL (ref 0.61–1.24)
GFR, Estimated: >60 mL/min (ref >60)
Glucose, Bld: 272 mg/dL — ABNORMAL HIGH (ref 70–99)
Potassium: 4.2 mmol/L (ref 3.5–5.1)
Sodium: 135 mmol/L (ref 135–145)
Total Bilirubin: 0.4 mg/dL (ref 0.3–1.2)
Total Protein: 7.7 g/dL (ref 6.5–8.1)

## 2021-06-15 LAB — TROPONIN I (HIGH SENSITIVITY)
Troponin I (High Sensitivity): 5 ng/L (ref <18)
Troponin I (High Sensitivity): 5 ng/L (ref <18)

## 2021-06-15 LAB — CBC WITH DIFFERENTIAL/PLATELET
Abs Immature Granulocytes: 0.02 10*3/uL (ref 0.00–0.07)
Basophils Absolute: 0 10*3/uL (ref 0.0–0.1)
Basophils Relative: 0 %
Eosinophils Absolute: 0.2 10*3/uL (ref 0.0–0.5)
Eosinophils Relative: 2 %
HCT: 43.1 % (ref 39.0–52.0)
Hemoglobin: 14.7 g/dL (ref 13.0–17.0)
Immature Granulocytes: 0 %
Lymphocytes Relative: 28 %
Lymphs Abs: 2.5 10*3/uL (ref 0.7–4.0)
MCH: 29 pg (ref 26.0–34.0)
MCHC: 34.1 g/dL (ref 30.0–36.0)
MCV: 85 fL (ref 80.0–100.0)
Monocytes Absolute: 0.9 10*3/uL (ref 0.1–1.0)
Monocytes Relative: 10 %
Neutro Abs: 5.4 10*3/uL (ref 1.7–7.7)
Neutrophils Relative %: 60 %
Platelets: 292 10*3/uL (ref 150–400)
RBC: 5.07 MIL/uL (ref 4.22–5.81)
RDW: 12.9 % (ref 11.5–15.5)
WBC: 9 10*3/uL (ref 4.0–10.5)
nRBC: 0 % (ref 0.0–0.2)

## 2021-06-15 LAB — MAGNESIUM: Magnesium: 1.8 mg/dL (ref 1.7–2.4)

## 2021-06-15 LAB — CBG MONITORING, ED: Glucose-Capillary: 274 mg/dL — ABNORMAL HIGH (ref 70–99)

## 2021-06-15 LAB — BRAIN NATRIURETIC PEPTIDE: B Natriuretic Peptide: 10.2 pg/mL (ref 0.0–100.0)

## 2021-06-15 IMAGING — CT CT ANGIO CHEST
3 of 9 series · 18 of 36 positions shown · IV contrast (Omnipaque)
Comparison: [DATE]

CLINICAL DATA: Diagnosed with mass to the left chest in [DATE] with worsening chest pain.

EXAM:
CT ANGIOGRAPHY CHEST WITH CONTRAST
TECHNIQUE: Multidetector CT imaging of the chest was performed using the
standard protocol during bolus administration of intravenous
contrast. Multiplanar CT image reconstructions and MIPs were
obtained to evaluate the vascular anatomy.
CONTRAST:  100mL OMNIPAQUE IOHEXOL 350 MG/ML SOLN

[Series 5: pe thins · axial · 0.87mm/px · z∈[-340,-69]mm · 14 of 313 slices shown]
[im 21/313  lung]
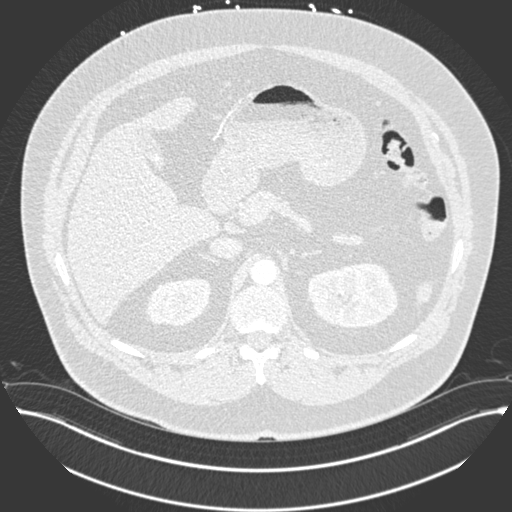
[im 42/313  mediastinal]
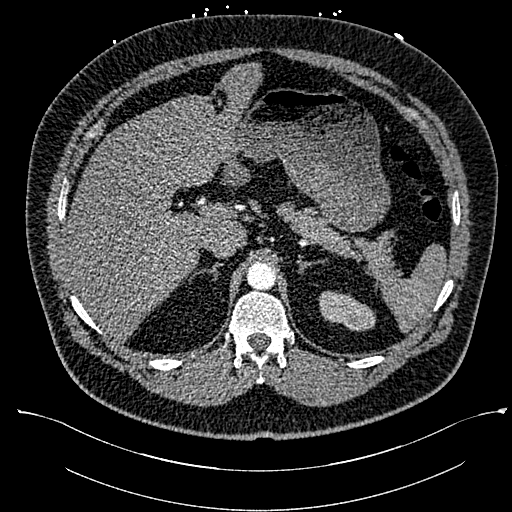
[im 63/313  lung]
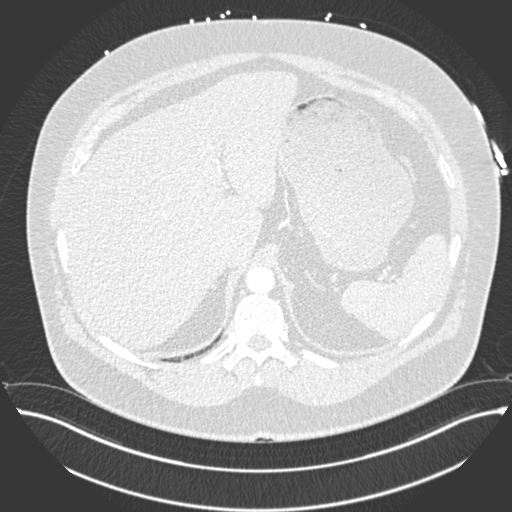
[im 84/313  mediastinal]
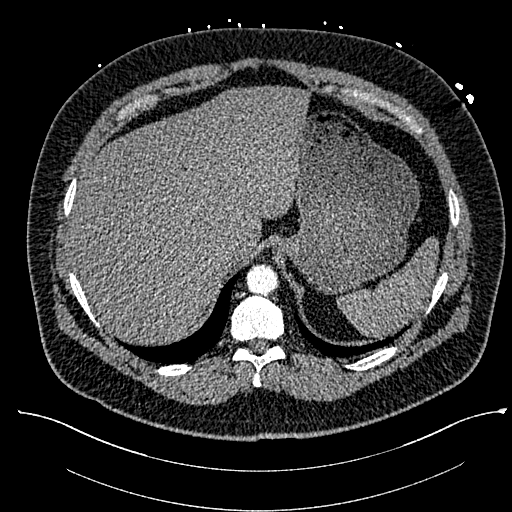
[im 105/313  lung]
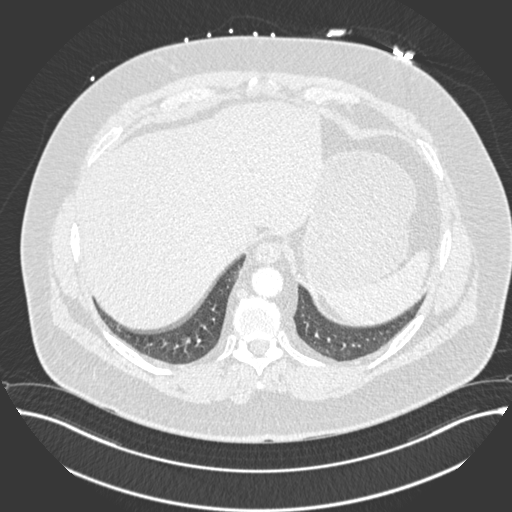
[im 125/313  mediastinal]
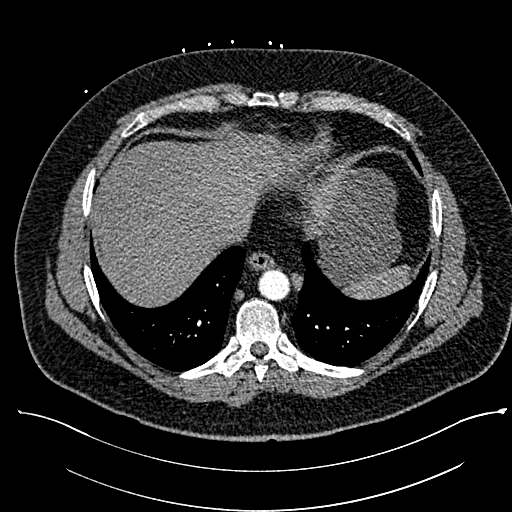
[im 146/313  lung]
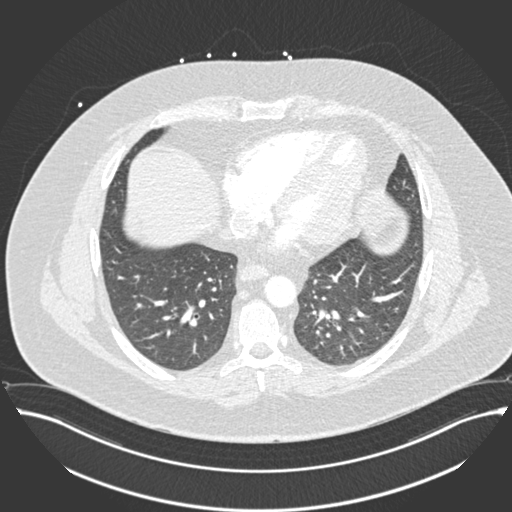
[im 167/313  mediastinal]
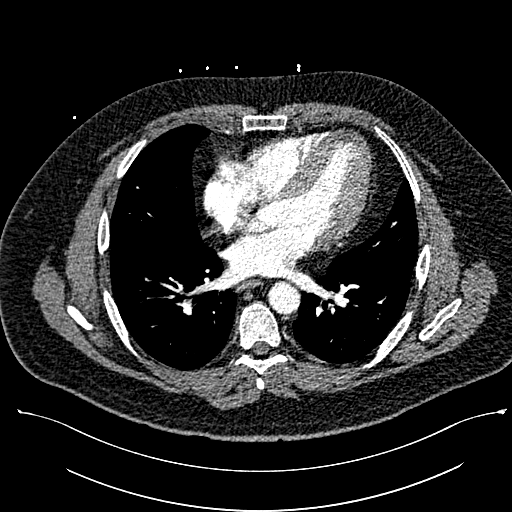
[im 188/313  lung]
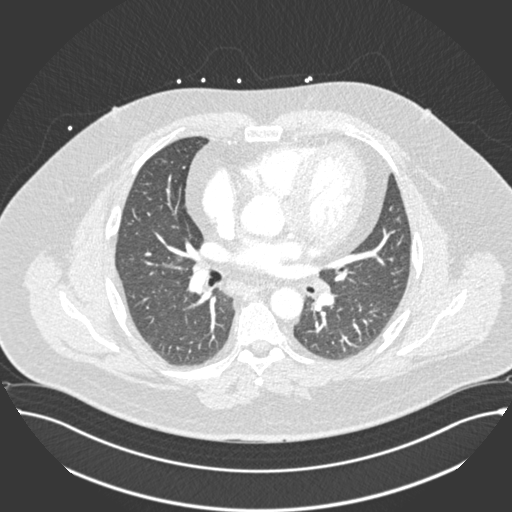
[im 209/313  mediastinal]
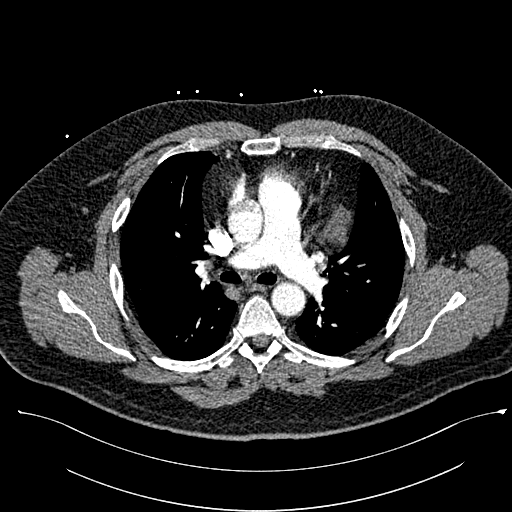
[im 229/313  lung]
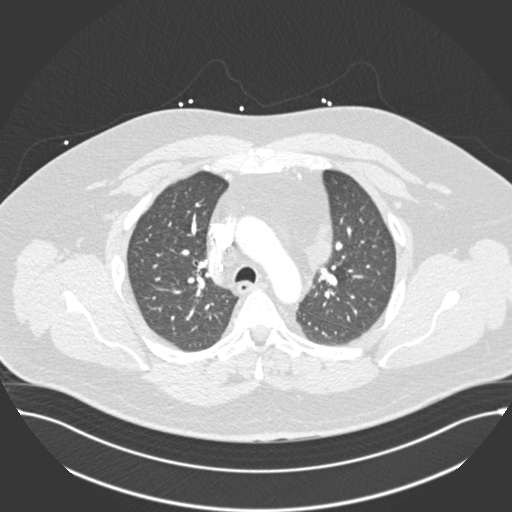
[im 250/313  mediastinal]
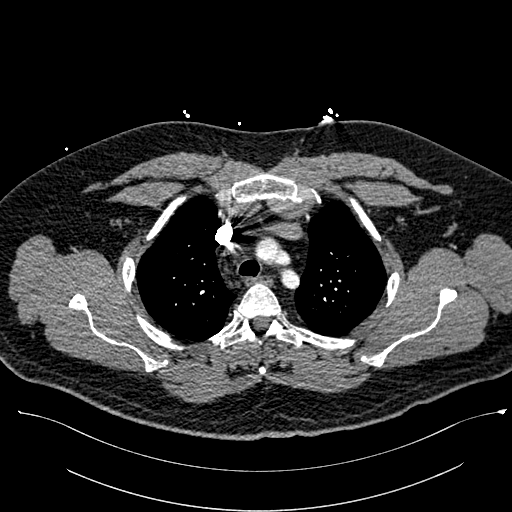
[im 271/313  lung]
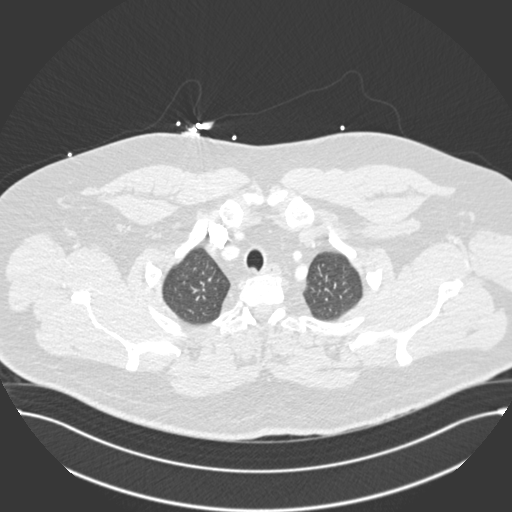
[im 292/313  mediastinal]
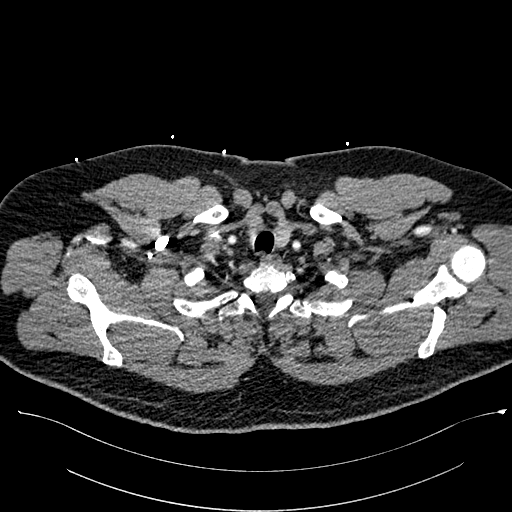

[Series 6: pe lung · axial · 0.71mm/px · z∈[-253,-121]mm · 3 of 88 slices shown]
[im 22/88  mediastinal]
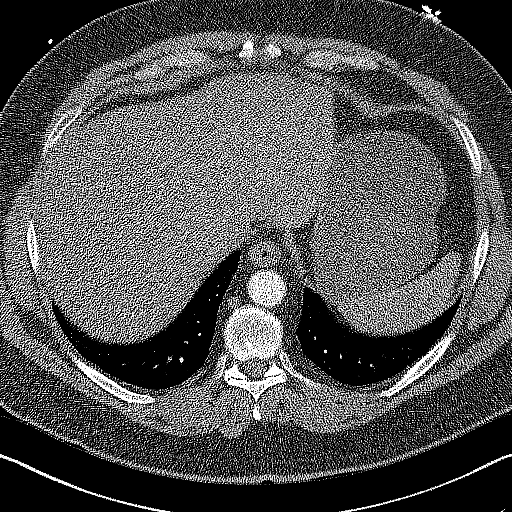
[im 44/88  mediastinal]
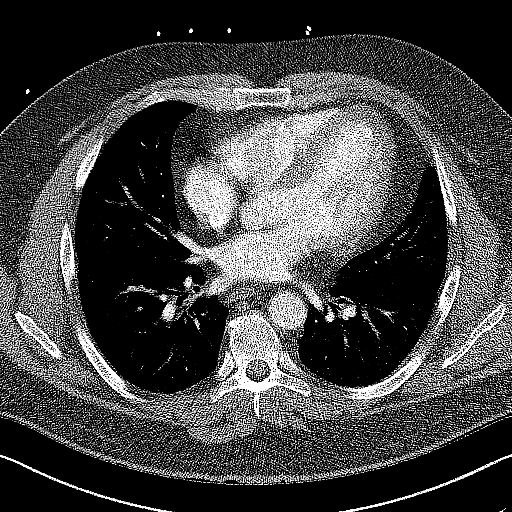
[im 66/88  mediastinal]
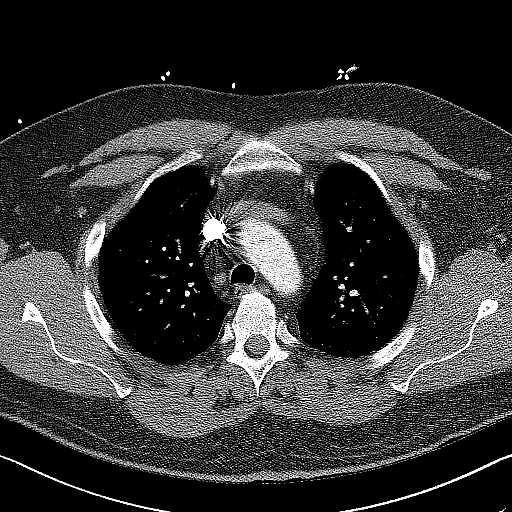

[Series 7: pe coronal mpr · coronal · 0.63mm/px · 1 of 197 slices shown]
[im 99/197  mediastinal]
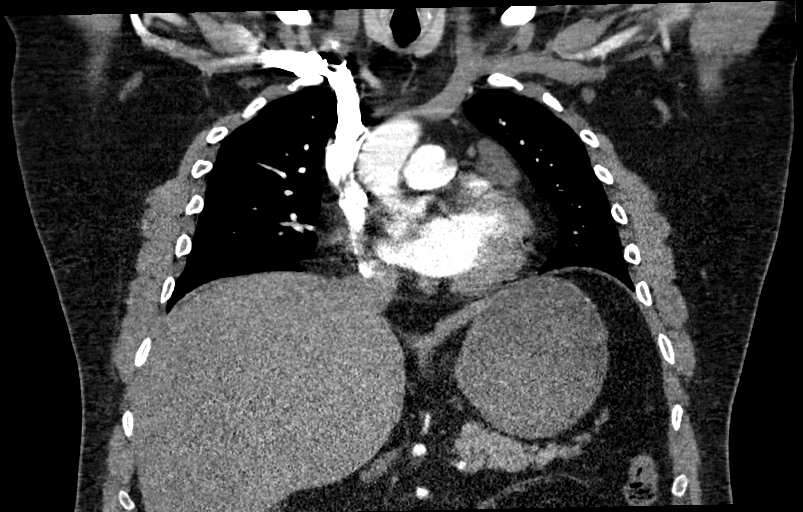

[18 of 36 positions shown; findings below may reference images not displayed]

FINDINGS: Cardiovascular: Satisfactory opacification of the pulmonary arteries
to the segmental level. No evidence of pulmonary embolism. Normal
heart size. No pericardial effusion.

Mediastinum/Nodes: A 4.9 cm x 4.7 cm x 2.2 cm nonenhancing,
macrolobulated area of low attenuation (approximately
Hounsfield units) is again seen adjacent to the left atrial
appendage. Thyroid gland, trachea, and esophagus demonstrate no
significant findings.

Lungs/Pleura: Lungs are clear. No pleural effusion or pneumothorax.

Upper Abdomen: There is diffuse fatty infiltration of the liver
parenchyma.

Multiple subcentimeter gallstones are seen within a contracted
gallbladder.

Musculoskeletal: No chest wall abnormality. No acute or significant
osseous findings.

Review of the MIP images confirms the above findings.
IMPRESSION: 1. No evidence of pulmonary embolism or acute cardiopulmonary
disease.
2. Multilobulated cystic lesion within the mediastinum, adjacent to
the left atrial appendage, likely consistent with a pericardial cyst
versus benign lymphovascular malformation.
3. Hepatic steatosis.
4. Cholelithiasis.

## 2021-06-15 MED ORDER — IOHEXOL 350 MG/ML SOLN
100.0000 mL | Freq: Once | INTRAVENOUS | Status: AC | PRN
  Administered 2021-06-15: 100 mL via INTRAVENOUS

## 2021-06-15 MED ORDER — HYDROCODONE-ACETAMINOPHEN 5-325 MG PO TABS
1.0000 | ORAL_TABLET | Freq: Once | ORAL | Status: AC
  Administered 2021-06-15: 1 via ORAL
  Filled 2021-06-15: qty 1

## 2021-06-15 NOTE — Discharge Instructions (Signed)
Take ibuprofen and Tylenol at home as needed.  Keep your follow-up appointment on the ninth to discuss management options with your thoracic surgeon.  Return to the ED for any new or worsening severity symptoms.

## 2021-06-15 NOTE — ED Notes (Signed)
Patient updated on plan of care

## 2021-06-15 NOTE — ED Provider Notes (Addendum)
[facility] Provider Note   CSN: [id number] Arrival date & time: 06/15/21  1933     History  Chief Complaint  Patient presents with   Chest Pain    Joshua Brady is a 46 y.o. male.   Chest Pain Associated symptoms: fatigue and shortness of breath   Associated symptoms: no abdominal pain, no back pain, no cough, no diaphoresis, no dizziness, no fever, no headache, no nausea, no numbness, no palpitations, no vomiting and no weakness   Patient presents for worsening chest pain.  He has had a recent diagnosis of a lobulated lesion adjacent to left atrial appendage.  This is caused intermittent symptoms of chest pain and dyspnea over several months.  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  He does have a appointment with CT surgery scheduled for 9 days from now.  He reports worsening of symptoms of chest pain over the past several days.  He does not have a history of diabetes but has recently checked his blood sugars at home and found to be high in the range of 300.  These were checked postprandially.  Patient works in [employer].  He has had increasing frequency of shifts.  He reports worsened fatigue.    Home Medications Prior to Admission medications   Medication Sig Start Date End Date Taking? Authorizing Provider  aspirin EC 81 MG tablet Take 1 tablet (81 mg total) by mouth daily. Swallow whole. 10/29/20   Park Liter, MD  atorvastatin (LIPITOR) 40 MG tablet Take 1 tablet (40 mg total) by mouth daily. 11/23/20   Bhagat, Crista Luria, PA  carvedilol (COREG) 6.25 MG tablet Take 1 tablet (6.25 mg total) by mouth 2 (two) times daily with a meal. 11/22/20   Bhagat, Bhavinkumar, PA  clobetasol cream (TEMOVATE) 2.09 % Apply 1 application on to the skin 2 (two) times daily. 12/12/20   Emeterio Reeve, DO  Coenzyme Q10 (CO Q-10 PO) Take 1 tablet by mouth daily at 12 noon. Unknown strenght    [provider]  fluticasone (FLONASE) 50 MCG/ACT nasal spray Place 2 sprays into each nostril once daily 01/10/21     guaiFENesin (MUCINEX) 600 MG 12 hr tablet Take 2 tablets (1,200 mg total) by mouth 2 (two) times daily. 02/28/21   Terrilyn Saver, NP  OVER THE COUNTER MEDICATION Take 1 tablet by mouth daily at 12 noon. Beet root/Unknown strength    [provider]      Allergies    Other, Prednisone, and Topiramate    Review of Systems   Review of Systems  Constitutional:  Positive for fatigue. Negative for activity change, appetite change, chills, diaphoresis and fever.  HENT:  Negative for ear pain and sore throat.   Eyes:  Negative for pain and visual disturbance.  Respiratory:  Positive for shortness of breath. Negative for cough, chest tightness, wheezing and stridor.   Cardiovascular:  Positive for chest pain. Negative for palpitations and leg swelling.  Gastrointestinal:  Negative for abdominal pain, diarrhea, nausea and vomiting.  Genitourinary:  Negative for dysuria, flank pain and hematuria.  Musculoskeletal:  Negative for arthralgias, back pain, myalgias and neck pain.  Skin:  Negative for color change and rash.  Neurological:  Negative for dizziness, seizures, syncope, weakness, light-headedness, numbness and headaches.  Hematological:  Does not bruise/bleed easily.  Psychiatric/Behavioral:  Negative for confusion and decreased concentration.   All other systems reviewed and are negative.  Physical Exam Updated Vital Signs BP 139/88    Pulse 64    Temp 98 F (36.7 C) (Oral)    Resp 14    Ht 6' (1.829 m)    Wt 127 kg    SpO2 97%    BMI 37.97 kg/m  Physical Exam Vitals and nursing note reviewed.  Constitutional:      General: He is not in acute distress.    Appearance: He is well-developed. He is not ill-appearing, toxic-appearing or diaphoretic.  HENT:     Head: Normocephalic and atraumatic.  Eyes:     Extraocular Movements: Extraocular movements intact.      Conjunctiva/sclera: Conjunctivae normal.  Neck:     Vascular: No JVD.  Cardiovascular:     Rate and Rhythm: Normal rate and regular rhythm.     Heart sounds: Normal heart sounds. No murmur heard. Pulmonary:     Effort: Pulmonary effort is normal. No respiratory distress.     Breath sounds: Normal breath sounds. No decreased breath sounds, wheezing or rales.  Chest:     Chest wall: Tenderness present.  Abdominal:     Palpations: Abdomen is soft.     Tenderness: There is no abdominal tenderness.  Musculoskeletal:        General: No swelling.     Cervical back: Normal range of motion and neck supple.     Right lower leg: No edema.     Left lower leg: No edema.  Skin:    General: Skin is warm and dry.     Capillary Refill: Capillary refill takes less than 2 seconds.     Coloration: Skin is not pale.  Neurological:     General: No focal deficit present.     Mental Status: He is alert and oriented to person, place, and time.     Cranial Nerves: No cranial nerve deficit.     Motor: No weakness.  Psychiatric:        Mood and Affect: Mood normal.        Behavior: Behavior normal.    ED Results / Procedures / Treatments   Labs (all labs ordered are listed, but only abnormal results are displayed) Labs Reviewed  COMPREHENSIVE METABOLIC PANEL - Abnormal; Notable for the following components:      Result Value   Glucose, Bld 272 (*)    ALT 48 (*)    All other components within normal limits  CBG MONITORING, ED - Abnormal; Notable for the following components:   Glucose-Capillary 274 (*)    All other components within normal limits  MAGNESIUM  BRAIN NATRIURETIC PEPTIDE  CBC WITH DIFFERENTIAL/PLATELET  CBG MONITORING, ED  TROPONIN I (HIGH SENSITIVITY)  TROPONIN I (HIGH SENSITIVITY)    EKG EKG Interpretation  Date/Time:  Sunday June 15 2021 19:41:19 EST Ventricular Rate:  82 PR Interval:  180 QRS Duration: 94 QT Interval:  345 QTC Calculation: 403 R Axis:   67 Text  Interpretation: Sinus rhythm Confirmed by Godfrey Pick ([id number]) on 06/15/2021 7:53:13 PM  Radiology CT ANGIO CHEST PE W OR WO CONTRAST  Result Date: 06/15/2021 CLINICAL DATA:  Diagnosed with mass to the left chest in October 2022 with worsening chest pain. EXAM: CT ANGIOGRAPHY CHEST WITH CONTRAST TECHNIQUE: Multidetector CT imaging of the chest was performed using the standard protocol during bolus administration of intravenous contrast. Multiplanar CT image reconstructions and MIPs were obtained to evaluate the vascular anatomy. CONTRAST:  176mL OMNIPAQUE IOHEXOL 350 MG/ML SOLN COMPARISON:  April 18, 2021  FINDINGS: Cardiovascular: Satisfactory opacification of the pulmonary arteries to the segmental level. No evidence of pulmonary embolism. Normal heart size. No pericardial effusion. Mediastinum/Nodes: A 4.9 cm x 4.7 cm x 2.2 cm nonenhancing, macrolobulated area of low attenuation (approximately 7.98 Hounsfield units) is again seen adjacent to the left atrial appendage. Thyroid gland, trachea, and esophagus demonstrate no significant findings. Lungs/Pleura: Lungs are clear. No pleural effusion or pneumothorax. Upper Abdomen: There is diffuse fatty infiltration of the liver parenchyma. Multiple subcentimeter gallstones are seen within a contracted gallbladder. Musculoskeletal: No chest wall abnormality. No acute or significant osseous findings. Review of the MIP images confirms the above findings. IMPRESSION: 1. No evidence of pulmonary embolism or acute cardiopulmonary disease. 2. 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 3. Hepatic steatosis. 4. Cholelithiasis. Electronically Signed   By: Virgina Norfolk M.D.   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  Mass was found several months ago.  He had a CTA of chest approximately 2 months ago.  Approximately 1 month ago, he had an MRI.  He does believe that the mass is increasing in size.  He has noted worsening chest pain.  Chest pain has been constant.  It is not worsened with exertion.  Patient has experienced recent increase in fatigue, although he attributes this to increased demands at work.  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  I reviewed EMR documentation regarding his chest mass.  Additionally, per chart review, patient did have a left heart cath in June.  Results at that time were reassuring with greatest vessel stenosis of 20%.  Based on his recent reassuring cath, patient unlikely to have ACS contributing to his chest pain.  Chest pain is likely related to the known mass.  Patient is well-appearing on exam.  His vital signs are normal.  His blood pressure is slightly elevated, which she states is typical for him when he comes into a doctor's office.  At baseline, he reports normal blood pressures.  His breathing is even and unlabored.  SPO2 is normal on room air.  I do not appreciate any abnormal sounds on cardiac or pulmonary auscultation.  Patient reports that he does not take anything for the pain at home.  When asked why, he states that he tries to avoid taking medications.  He was agreeable to pain medication here in the ED.  Dose of Norco was given while patient was awaiting diagnostic work-up.  Patient's blood sugar was found to be moderately elevated at 274.  He does state that he ate just prior to arrival in the ED.  Based on his  history of elevated blood sugars postprandially, I do not feel that this is can be diagnostic of a new diabetes.  Patient was encouraged to follow-up with his primary care doctor for random glucose levels and/or A1c measurements.  Bolus of IV fluids was given.  Lab work showed normal electrolytes, normal hemoglobin, no leukocytosis, normal BNP and normal troponin.  CTA of chest showed redemonstration of the multilobulated cystic mediastinal lesion.  Based on dimensions of this lesion, it does not appear that it is increasing in size.  Patient and wife were informed of work-up results.  They do feel comfortable with discharge home and follow-up with CT surgery as scheduled.  Patient was provided a work note to minimize the physical and emotional demands prior to his follow-up appointments.  He was discharged in stable condition.        Final Clinical Impression(s) / ED Diagnoses Final diagnoses:  Chest pain, unspecified type    Rx / DC Orders ED Discharge Orders     None         Godfrey Pick, MD 06/17/21 1638    Godfrey Pick, MD 06/17/21 [phone]

## 2021-06-15 NOTE — ED Triage Notes (Signed)
Reports he was diagnosed with a mass to the left chest in October.  Has been having this pain since diagnosis that has gotten worse the last two days.  Report blood sugar was 296 a few days ago.  No hx of diabetes.

## 2021-06-15 NOTE — ED Notes (Signed)
Patient taken to CT at this time.

## 2021-06-17 ENCOUNTER — Telehealth: Payer: Self-pay | Admitting: General Practice

## 2021-06-17 NOTE — Telephone Encounter (Signed)
Transition Care Management Unsuccessful Follow-up Telephone Call  Date of discharge and from where:  06/15/21 from [facility]  Attempts:  1st Attempt  Reason for unsuccessful TCM follow-up call:  Left voice message

## 2021-06-18 NOTE — Telephone Encounter (Signed)
Transition Care Management Unsuccessful Follow-up Telephone Call  Date of discharge and from where:  06/15/21 from [facility]  Attempts:  2nd Attempt  Reason for unsuccessful TCM follow-up call:  Left voice message

## 2021-06-20 NOTE — Telephone Encounter (Signed)
Transition Care Management Unsuccessful Follow-up Telephone Call  Date of discharge and from where:  06/15/21 from [facility]  Attempts:  3rd Attempt  Reason for unsuccessful TCM follow-up call:  Left voice message

## 2021-06-23 ENCOUNTER — Encounter: Payer: Self-pay | Admitting: Thoracic Surgery (Cardiothoracic Vascular Surgery)

## 2021-06-23 ENCOUNTER — Encounter: Payer: Self-pay | Admitting: *Deleted

## 2021-06-23 ENCOUNTER — Institutional Professional Consult (permissible substitution): Payer: No Typology Code available for payment source | Admitting: Thoracic Surgery (Cardiothoracic Vascular Surgery)

## 2021-06-23 ENCOUNTER — Other Ambulatory Visit: Payer: Self-pay | Admitting: *Deleted

## 2021-06-23 ENCOUNTER — Other Ambulatory Visit: Payer: Self-pay

## 2021-06-23 VITALS — BP 159/92 | HR 114 | Resp 20 | Ht 72.0 in

## 2021-06-23 DIAGNOSIS — I318 Other specified diseases of pericardium: Secondary | ICD-10-CM | POA: Diagnosis not present

## 2021-06-23 DIAGNOSIS — J9859 Other diseases of mediastinum, not elsewhere classified: Secondary | ICD-10-CM

## 2021-06-23 NOTE — Progress Notes (Signed)
PCP is Joshua Nutting, DO Referring Provider is Park Liter, MD  Chief Complaint  Patient presents with   Pericardial mass    Initial surgical consult, MR Chest 12/7, CTA 11/4, ECHO 6/9, cath 6/9    HPI: [name] is sent for consultation regarding a mediastinal mass.  Joshua Brady is a 46 year old gentleman with a history of chronic chest pain, dyspnea, hypertension, hyperlipidemia, obesity, obstructive sleep apnea, low testosterone, heart murmur and closed head injury.  He has been suffering from chest pain for about 2 years now.  He describes this as a rather constant pressure in the chest.  He has it both at rest and with exertion.  He says is pretty much constant.  He also has shortness of breath with even mild exertion.  Afterwards he is very fatigued.  Back in the summer he was having a very severe episode.  He was admitted and had a cardiac work-up including an echocardiogram and catheterization.  No cardiac source was found for his pain.  He did not have any significant coronary disease.  As part of his work-up he had a CT of the chest which showed a "middle" mediastinal mass/cyst.  A follow-up CT showed no significant change in size.  An MR showed fluid density in part and suggest that this was a benign lesion.  He recently had another severe episode and went to the emergency room.  Another CT showed the lesion to be approximately the same or possibly slightly smaller than it was back in June.  He complains of fatigue but no double vision or difficulty swallowing.  He says that his chest wall is tender to touch and his wife says that sometimes he will have pain even if she hugs him.  Zubrod Score: At the time of surgery this patients most appropriate activity status/level should be described as: []     0    Normal activity, no symptoms [x]     1    Restricted in physical strenuous activity but ambulatory, able to do out light work []     2    Ambulatory and capable of self care,  unable to do work activities, up and about >50 % of waking hours                              []     3    Only limited self care, in bed greater than 50% of waking hours []     4    Completely disabled, no self care, confined to bed or chair []     5    Moribund  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   Low testosterone 04/29/2018   Lumbar degenerative disc disease 02/06/2020   Palpitations 03/03/2018   Sleep apnea 03/11/2018   SOB (shortness of breath) on exertion 03/03/2018   Viral illness 09/11/2019    Past Surgical History:  Procedure Laterality Date   Broken Arm     LEFT HEART CATH AND CORONARY ANGIOGRAPHY N/A 11/22/2020   Procedure: LEFT HEART CATH AND CORONARY ANGIOGRAPHY;  Surgeon: Troy Sine,  MD;  Location: [facility];  Service: Cardiovascular;  Laterality: N/A;   LIVER BIOPSY      Family History  Problem Relation Age of Onset   Alcohol abuse Father    Other Neg Hx        low testosterone    Social History Social History   Tobacco Use   Smoking status: Never   Smokeless tobacco: Never  Vaping Use   Vaping Use: Never used  Substance Use Topics   Alcohol use: Yes   Drug use: No    Current Outpatient Medications  Medication Sig Dispense Refill   aspirin EC 81 MG tablet Take 1 tablet (81 mg total) by mouth daily. Swallow whole. 90 tablet 3   atorvastatin (LIPITOR) 40 MG tablet Take 1 tablet (40 mg total) by mouth daily. 30 tablet 6   carvedilol (COREG) 6.25 MG tablet Take 1 tablet (6.25 mg total) by mouth 2 (two) times daily with a meal. 60 tablet 6   clobetasol cream (TEMOVATE) 0.93 % Apply 1 application on to the skin 2 (two) times  daily. 30 g 1   Coenzyme Q10 (CO Q-10 PO) Take 1 tablet by mouth daily at 12 noon. Unknown strenght     fluticasone (FLONASE) 50 MCG/ACT nasal spray Place 2 sprays into each nostril once daily 16 g 6   guaiFENesin (MUCINEX) 600 MG 12 hr tablet Take 2 tablets (1,200 mg total) by mouth 2 (two) times daily. 30 tablet 2   OVER THE COUNTER MEDICATION Take 1 tablet by mouth daily at 12 noon. Beet root/Unknown strength     No current facility-administered medications for this visit.    Allergies  Allergen Reactions   Other     Ants - see notes from ER visit 02/2018   Prednisone    Topiramate     Anxiety, dizzy, depressed, suicidal thoughts, agitated    Review of Systems  Constitutional:  Positive for activity change, appetite change, fatigue and unexpected weight change (weight gain, 10 lbs in 3 months).  HENT:  Negative for trouble swallowing and voice change.   Respiratory:  Positive for shortness of breath.   Cardiovascular:  Positive for chest pain and palpitations. Negative for leg swelling.  Gastrointestinal:  Negative for abdominal distention and abdominal pain.  Genitourinary:  Positive for frequency.  Musculoskeletal:  Positive for arthralgias and myalgias.  Skin:  Positive for rash.  Neurological:  Positive for speech difficulty (slurred) and headaches.       Memory loss  Hematological:  Does not bruise/bleed easily.  Psychiatric/Behavioral:  The patient is nervous/anxious.   All other systems reviewed and are negative.  BP (!) 159/92 (BP Location: Left Arm, Patient Position: Sitting)    Pulse (!) 114    Resp 20    Ht 6' (1.829 m)    SpO2 95% Comment: RA   BMI 37.97 kg/m  Physical Exam Vitals reviewed.  Constitutional:      General: He is not in acute distress.    Appearance: He is obese.  HENT:     Head: Normocephalic and atraumatic.  Eyes:     General: No scleral icterus.    Extraocular Movements: Extraocular movements intact.  Cardiovascular:     Rate and Rhythm:  Normal rate and regular rhythm.     Heart sounds: Murmur (2/6 systolic) heard.  Pulmonary:     Effort: No respiratory distress.     Breath sounds: Normal breath sounds. No wheezing or rales.  Abdominal:  General: There is no distension.     Palpations: Abdomen is soft.     Tenderness: There is no abdominal tenderness.  Skin:    General: Skin is warm and dry.     Findings: Rash (desquamationg rash on hands) present.  Neurological:     General: No focal deficit present.     Mental Status: He is alert and oriented to person, place, and time.     Cranial Nerves: No cranial nerve deficit.     Motor: No weakness.    Diagnostic Tests: CT ANGIOGRAPHY CHEST WITH CONTRAST   TECHNIQUE: Multidetector CT imaging of the chest was performed using the standard protocol during bolus administration of intravenous contrast. Multiplanar CT image reconstructions and MIPs were obtained to evaluate the vascular anatomy.   CONTRAST:  123mL OMNIPAQUE IOHEXOL 350 MG/ML SOLN   COMPARISON:  April 18, 2021   FINDINGS: Cardiovascular: Satisfactory opacification of the pulmonary arteries to the segmental level. No evidence of pulmonary embolism. Normal heart size. No pericardial effusion.   Mediastinum/Nodes: A 4.9 cm x 4.7 cm x 2.2 cm nonenhancing, macrolobulated area of low attenuation (approximately 7.98 Hounsfield units) is again seen adjacent to the left atrial appendage. Thyroid gland, trachea, and esophagus demonstrate no significant findings.   Lungs/Pleura: Lungs are clear. No pleural effusion or pneumothorax.   Upper Abdomen: There is diffuse fatty infiltration of the liver parenchyma.   Multiple subcentimeter gallstones are seen within a contracted gallbladder.   Musculoskeletal: No chest wall abnormality. No acute or significant osseous findings.   Review of the MIP images confirms the above findings.   IMPRESSION: 1. No evidence of pulmonary embolism or acute  cardiopulmonary disease. 2. 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 3. Hepatic steatosis. 4. Cholelithiasis.     Electronically Signed   By: Virgina Norfolk M.D.   On: 06/15/2021 21:30  I personally reviewed all 3 of his CTs of the chest as well as the MRI of the chest.  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  Impression: Shogo Larkey is a 46 year old gentleman with a history of chronic chest pain, dyspnea, hypertension, hyperlipidemia, obesity, obstructive sleep apnea, low testosterone, heart murmur and closed head injury.  He presents with an atypical chest pain syndrome along with shortness of breath and fatigue.  He has had an extensive cardiac work-up including echocardiogram and catheterization, which showed no significant coronary disease or left ventricular dysfunction.    Mediastinal mass he was found to have a mass in the mediastinum along the left heart border on a CT of the chest.  Did have benign features by MR.  Radiology suggested it might be a pericardial cyst or lymphovascular malformation.  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  It is unclear to me if any of his symptoms are related to the mediastinal mass.  Certainly some components of it could possibly be related but there is certainly aspects that are extremely hard to attribute to the mass.  I emphasized to Mr. and Mrs. Crisanto that there was no guarantee that removal of the mass would improve any of his symptoms, and could possibly make some of them worse.  Some of his complaints could go along with myasthenia gravis.  He certainly does not have any classic history.  I do think it would be  reasonable to check acetylcholine receptor antibodies prior to surgery.  I offered him the  option of robotic assisted left VATS for resection of the mediastinal mass.  I described the procedure to him in detail including the need for general anesthesia, the incisions to be used, use of a drainage tube postoperatively, the expected hospital stay, and overall recovery.  Again I gave no indication that this would improve his symptoms.  I informed him of the indications, risks, benefits, and alternatives.  He and his wife understand the risks include, but are not limited to death, MI, DVT, PE, bleeding, need for transfusion, infection, phrenic nerve injury with diaphragm paralysis, cardiac arrhythmias, as well as possibility of other unforeseeable complications.  He wishes to proceed with surgical resection of the mass.  Plan: Check acetylcholine receptor antibodies Robotic left VATS for resection of mediastinal mass on Thursday, 07/03/2021  [name], MD [facility] [phone]

## 2021-06-23 NOTE — H&P (View-Only) (Signed)
PCP is Luetta Nutting, DO Referring Provider is Park Liter, MD  Chief Complaint  Patient presents with   Pericardial mass    Initial surgical consult, MR Chest 12/7, CTA 11/4, ECHO 6/9, cath 6/9    HPI: [name] is sent for consultation regarding a mediastinal mass.  Joshua Brady is a 46 year old gentleman with a history of chronic chest pain, dyspnea, hypertension, hyperlipidemia, obesity, obstructive sleep apnea, low testosterone, heart murmur and closed head injury.  He has been suffering from chest pain for about 2 years now.  He describes this as a rather constant pressure in the chest.  He has it both at rest and with exertion.  He says is pretty much constant.  He also has shortness of breath with even mild exertion.  Afterwards he is very fatigued.  Back in the summer he was having a very severe episode.  He was admitted and had a cardiac work-up including an echocardiogram and catheterization.  No cardiac source was found for his pain.  He did not have any significant coronary disease.  As part of his work-up he had a CT of the chest which showed a "middle" mediastinal mass/cyst.  A follow-up CT showed no significant change in size.  An MR showed fluid density in part and suggest that this was a benign lesion.  He recently had another severe episode and went to the emergency room.  Another CT showed the lesion to be approximately the same or possibly slightly smaller than it was back in June.  He complains of fatigue but no double vision or difficulty swallowing.  He says that his chest wall is tender to touch and his wife says that sometimes he will have pain even if she hugs him.  Zubrod Score: At the time of surgery this patients most appropriate activity status/level should be described as: []     0    Normal activity, no symptoms [x]     1    Restricted in physical strenuous activity but ambulatory, able to do out light work []     2    Ambulatory and capable of self care,  unable to do work activities, up and about >50 % of waking hours                              []     3    Only limited self care, in bed greater than 50% of waking hours []     4    Completely disabled, no self care, confined to bed or chair []     5    Moribund  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   Low testosterone 04/29/2018   Lumbar degenerative disc disease 02/06/2020   Palpitations 03/03/2018   Sleep apnea 03/11/2018   SOB (shortness of breath) on exertion 03/03/2018   Viral illness 09/11/2019    Past Surgical History:  Procedure Laterality Date   Broken Arm     LEFT HEART CATH AND CORONARY ANGIOGRAPHY N/A 11/22/2020   Procedure: LEFT HEART CATH AND CORONARY ANGIOGRAPHY;  Surgeon: Troy Sine,  MD;  Location: [facility];  Service: Cardiovascular;  Laterality: N/A;   LIVER BIOPSY      Family History  Problem Relation Age of Onset   Alcohol abuse Father    Other Neg Hx        low testosterone    Social History Social History   Tobacco Use   Smoking status: Never   Smokeless tobacco: Never  Vaping Use   Vaping Use: Never used  Substance Use Topics   Alcohol use: Yes   Drug use: No    Current Outpatient Medications  Medication Sig Dispense Refill   aspirin EC 81 MG tablet Take 1 tablet (81 mg total) by mouth daily. Swallow whole. 90 tablet 3   atorvastatin (LIPITOR) 40 MG tablet Take 1 tablet (40 mg total) by mouth daily. 30 tablet 6   carvedilol (COREG) 6.25 MG tablet Take 1 tablet (6.25 mg total) by mouth 2 (two) times daily with a meal. 60 tablet 6   clobetasol cream (TEMOVATE) 2.68 % Apply 1 application on to the skin 2 (two) times  daily. 30 g 1   Coenzyme Q10 (CO Q-10 PO) Take 1 tablet by mouth daily at 12 noon. Unknown strenght     fluticasone (FLONASE) 50 MCG/ACT nasal spray Place 2 sprays into each nostril once daily 16 g 6   guaiFENesin (MUCINEX) 600 MG 12 hr tablet Take 2 tablets (1,200 mg total) by mouth 2 (two) times daily. 30 tablet 2   OVER THE COUNTER MEDICATION Take 1 tablet by mouth daily at 12 noon. Beet root/Unknown strength     No current facility-administered medications for this visit.    Allergies  Allergen Reactions   Other     Ants - see notes from ER visit 02/2018   Prednisone    Topiramate     Anxiety, dizzy, depressed, suicidal thoughts, agitated    Review of Systems  Constitutional:  Positive for activity change, appetite change, fatigue and unexpected weight change (weight gain, 10 lbs in 3 months).  HENT:  Negative for trouble swallowing and voice change.   Respiratory:  Positive for shortness of breath.   Cardiovascular:  Positive for chest pain and palpitations. Negative for leg swelling.  Gastrointestinal:  Negative for abdominal distention and abdominal pain.  Genitourinary:  Positive for frequency.  Musculoskeletal:  Positive for arthralgias and myalgias.  Skin:  Positive for rash.  Neurological:  Positive for speech difficulty (slurred) and headaches.       Memory loss  Hematological:  Does not bruise/bleed easily.  Psychiatric/Behavioral:  The patient is nervous/anxious.   All other systems reviewed and are negative.  BP (!) 159/92 (BP Location: Left Arm, Patient Position: Sitting)    Pulse (!) 114    Resp 20    Ht 6' (1.829 m)    SpO2 95% Comment: RA   BMI 37.97 kg/m  Physical Exam Vitals reviewed.  Constitutional:      General: He is not in acute distress.    Appearance: He is obese.  HENT:     Head: Normocephalic and atraumatic.  Eyes:     General: No scleral icterus.    Extraocular Movements: Extraocular movements intact.  Cardiovascular:     Rate and Rhythm:  Normal rate and regular rhythm.     Heart sounds: Murmur (2/6 systolic) heard.  Pulmonary:     Effort: No respiratory distress.     Breath sounds: Normal breath sounds. No wheezing or rales.  Abdominal:  General: There is no distension.     Palpations: Abdomen is soft.     Tenderness: There is no abdominal tenderness.  Skin:    General: Skin is warm and dry.     Findings: Rash (desquamationg rash on hands) present.  Neurological:     General: No focal deficit present.     Mental Status: He is alert and oriented to person, place, and time.     Cranial Nerves: No cranial nerve deficit.     Motor: No weakness.    Diagnostic Tests: CT ANGIOGRAPHY CHEST WITH CONTRAST   TECHNIQUE: Multidetector CT imaging of the chest was performed using the standard protocol during bolus administration of intravenous contrast. Multiplanar CT image reconstructions and MIPs were obtained to evaluate the vascular anatomy.   CONTRAST:  177mL OMNIPAQUE IOHEXOL 350 MG/ML SOLN   COMPARISON:  April 18, 2021   FINDINGS: Cardiovascular: Satisfactory opacification of the pulmonary arteries to the segmental level. No evidence of pulmonary embolism. Normal heart size. No pericardial effusion.   Mediastinum/Nodes: A 4.9 cm x 4.7 cm x 2.2 cm nonenhancing, macrolobulated area of low attenuation (approximately 7.98 Hounsfield units) is again seen adjacent to the left atrial appendage. Thyroid gland, trachea, and esophagus demonstrate no significant findings.   Lungs/Pleura: Lungs are clear. No pleural effusion or pneumothorax.   Upper Abdomen: There is diffuse fatty infiltration of the liver parenchyma.   Multiple subcentimeter gallstones are seen within a contracted gallbladder.   Musculoskeletal: No chest wall abnormality. No acute or significant osseous findings.   Review of the MIP images confirms the above findings.   IMPRESSION: 1. No evidence of pulmonary embolism or acute  cardiopulmonary disease. 2. 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 3. Hepatic steatosis. 4. Cholelithiasis.     Electronically Signed   By: Virgina Norfolk M.D.   On: 06/15/2021 21:30  I personally reviewed all 3 of his CTs of the chest as well as the MRI of the chest.  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  Impression: Joshua Brady is a 46 year old gentleman with a history of chronic chest pain, dyspnea, hypertension, hyperlipidemia, obesity, obstructive sleep apnea, low testosterone, heart murmur and closed head injury.  He presents with an atypical chest pain syndrome along with shortness of breath and fatigue.  He has had an extensive cardiac work-up including echocardiogram and catheterization, which showed no significant coronary disease or left ventricular dysfunction.    Mediastinal mass he was found to have a mass in the mediastinum along the left heart border on a CT of the chest.  Did have benign features by MR.  Radiology suggested it might be a pericardial cyst or lymphovascular malformation.  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  It is unclear to me if any of his symptoms are related to the mediastinal mass.  Certainly some components of it could possibly be related but there is certainly aspects that are extremely hard to attribute to the mass.  I emphasized to Mr. and Mrs. Kanady that there was no guarantee that removal of the mass would improve any of his symptoms, and could possibly make some of them worse.  Some of his complaints could go along with myasthenia gravis.  He certainly does not have any classic history.  I do think it would be  reasonable to check acetylcholine receptor antibodies prior to surgery.  I offered him the  option of robotic assisted left VATS for resection of the mediastinal mass.  I described the procedure to him in detail including the need for general anesthesia, the incisions to be used, use of a drainage tube postoperatively, the expected hospital stay, and overall recovery.  Again I gave no indication that this would improve his symptoms.  I informed him of the indications, risks, benefits, and alternatives.  He and his wife understand the risks include, but are not limited to death, MI, DVT, PE, bleeding, need for transfusion, infection, phrenic nerve injury with diaphragm paralysis, cardiac arrhythmias, as well as possibility of other unforeseeable complications.  He wishes to proceed with surgical resection of the mass.  Plan: Check acetylcholine receptor antibodies Robotic left VATS for resection of mediastinal mass on Thursday, 07/03/2021  [name], MD [facility] [phone]

## 2021-06-24 ENCOUNTER — Other Ambulatory Visit: Payer: Self-pay | Admitting: Thoracic Surgery (Cardiothoracic Vascular Surgery)

## 2021-06-24 ENCOUNTER — Other Ambulatory Visit: Payer: Self-pay | Admitting: *Deleted

## 2021-06-24 DIAGNOSIS — J9859 Other diseases of mediastinum, not elsewhere classified: Secondary | ICD-10-CM

## 2021-06-26 ENCOUNTER — Other Ambulatory Visit (HOSPITAL_COMMUNITY): Payer: Self-pay

## 2021-06-27 ENCOUNTER — Other Ambulatory Visit (HOSPITAL_COMMUNITY): Payer: Self-pay

## 2021-06-27 ENCOUNTER — Other Ambulatory Visit (HOSPITAL_BASED_OUTPATIENT_CLINIC_OR_DEPARTMENT_OTHER): Payer: Self-pay

## 2021-06-30 NOTE — Progress Notes (Signed)
Surgical Instructions    Your procedure is scheduled on 07/03/21.  Report to [facility] Main Entrance "A" at 10:30 A.M., then check in with the Admitting office.  Call this number if you have problems the morning of surgery:  [phone]   If you have any questions prior to your surgery date call [phone]: Open Monday-Friday 8am-4pm    Remember:  Do not eat or drink after midnight the night before your surgery     Take these medicines the morning of surgery with A SIP OF WATER: atorvastatin (LIPITOR) carvedilol (COREG)    As of today, STOP taking any Aspirin (unless otherwise instructed by your surgeon) Aleve, Naproxen, Ibuprofen, Motrin, Advil, Goody's, BC's, all herbal medications, fish oil, and all vitamins.     After your COVID test   You are not required to quarantine however you are required to wear a well-fitting mask when you are out and around people not in your household.  If your mask becomes wet or soiled, replace with a new one.  Wash your hands often with soap and water for 20 seconds or clean your hands with an alcohol-based hand sanitizer that contains at least 60% alcohol.  Do not share personal items.  Notify your provider: if you are in close contact with someone who has COVID  or if you develop a fever of 100.4 or greater, sneezing, cough, sore throat, shortness of breath or body aches.           Do not wear jewelry  Do not wear lotions, powders, colognes, or deodorant. Do not shave 48 hours prior to surgery.  Men may shave face and neck. Do not bring valuables to the hospital.              [facility] is not responsible for any belongings or valuables.  Do NOT Smoke (Tobacco/Vaping)  24 hours prior to your procedure  If you use a CPAP at night, you may bring your mask for your overnight stay.   Contacts, glasses, hearing aids, dentures or partials may not be worn into surgery, please bring cases for these belongings   For patients admitted  to the hospital, discharge time will be determined by your treatment team.   Patients discharged the day of surgery will not be allowed to drive home, and someone needs to stay with them for 24 hours.  NO VISITORS WILL BE ALLOWED IN PRE-OP WHERE PATIENTS ARE PREPPED FOR SURGERY.  ONLY 1 SUPPORT PERSON MAY BE PRESENT IN THE WAITING ROOM WHILE YOU ARE IN SURGERY.  IF YOU ARE TO BE ADMITTED, ONCE YOU ARE IN YOUR ROOM YOU WILL BE ALLOWED TWO (2) VISITORS. 1 (ONE) VISITOR MAY STAY OVERNIGHT BUT MUST ARRIVE TO THE ROOM BY 8pm.  Minor children may have two parents present. Special consideration for safety and communication needs will be reviewed on a case by case basis.  Special instructions:    Oral Hygiene is also important to reduce your risk of infection.  Remember - BRUSH YOUR TEETH THE MORNING OF SURGERY WITH YOUR REGULAR TOOTHPASTE   [facility]- Preparing For Surgery  Before surgery, you can play an important role. Because skin is not sterile, your skin needs to be as free of germs as possible. You can reduce the number of germs on your skin by washing with CHG (chlorahexidine gluconate) Soap before surgery.  CHG is an antiseptic cleaner which kills germs and bonds with the skin to continue killing germs even after washing.  Please do not use if you have an allergy to CHG or antibacterial soaps. If your skin becomes reddened/irritated stop using the CHG.  Do not shave (including legs and underarms) for at least 48 hours prior to first CHG shower. It is OK to shave your face.  Please follow these instructions carefully.     Shower the NIGHT BEFORE SURGERY and the MORNING OF SURGERY with CHG Soap.   If you chose to wash your hair, wash your hair first as usual with your normal shampoo. After you shampoo, rinse your hair and body thoroughly to remove the shampoo.  Then [employer] and genitals (private parts) with your normal soap and rinse thoroughly to remove soap.  After that Use CHG Soap  as you would any other liquid soap. You can apply CHG directly to the skin and wash gently with a scrungie or a clean washcloth.   Apply the CHG Soap to your body ONLY FROM THE NECK DOWN.  Do not use on open wounds or open sores. Avoid contact with your eyes, ears, mouth and genitals (private parts). Wash Face and genitals (private parts)  with your normal soap.   Wash thoroughly, paying special attention to the area where your surgery will be performed.  Thoroughly rinse your body with warm water from the neck down.  DO NOT shower/wash with your normal soap after using and rinsing off the CHG Soap.  Pat yourself dry with a CLEAN TOWEL.  Wear CLEAN PAJAMAS to bed the night before surgery  Place CLEAN SHEETS on your bed the night before your surgery  DO NOT SLEEP WITH PETS.   Day of Surgery:  Take a shower with CHG soap. Wear Clean/Comfortable clothing the morning of surgery Do not apply any deodorants/lotions.   Remember to brush your teeth WITH YOUR REGULAR TOOTHPASTE.   Please read over the following fact sheets that you were given.

## 2021-07-01 ENCOUNTER — Ambulatory Visit (HOSPITAL_COMMUNITY)
Admission: RE | Admit: 2021-07-01 | Discharge: 2021-07-01 | Disposition: A | Discharge status: Home / Self Care | Payer: No Typology Code available for payment source | Source: Ambulatory Visit | Attending: Thoracic Surgery (Cardiothoracic Vascular Surgery) | Admitting: Thoracic Surgery (Cardiothoracic Vascular Surgery)

## 2021-07-01 ENCOUNTER — Other Ambulatory Visit: Payer: Self-pay

## 2021-07-01 ENCOUNTER — Encounter (HOSPITAL_COMMUNITY): Payer: Self-pay

## 2021-07-01 ENCOUNTER — Encounter (HOSPITAL_COMMUNITY)
Admission: RE | Admit: 2021-07-01 | Discharge: 2021-07-01 | Disposition: A | Discharge status: Home / Self Care | Payer: No Typology Code available for payment source | Source: Ambulatory Visit | Attending: Thoracic Surgery (Cardiothoracic Vascular Surgery) | Admitting: Thoracic Surgery (Cardiothoracic Vascular Surgery)

## 2021-07-01 VITALS — BP 136/95 | HR 86 | Temp 98.0°F | Resp 18 | Ht 72.0 in | Wt 289.3 lb

## 2021-07-01 DIAGNOSIS — J9859 Other diseases of mediastinum, not elsewhere classified: Secondary | ICD-10-CM | POA: Insufficient documentation

## 2021-07-01 DIAGNOSIS — I10 Essential (primary) hypertension: Secondary | ICD-10-CM | POA: Insufficient documentation

## 2021-07-01 DIAGNOSIS — R079 Chest pain, unspecified: Secondary | ICD-10-CM | POA: Insufficient documentation

## 2021-07-01 DIAGNOSIS — Z20822 Contact with and (suspected) exposure to covid-19: Secondary | ICD-10-CM | POA: Insufficient documentation

## 2021-07-01 DIAGNOSIS — R222 Localized swelling, mass and lump, trunk: Secondary | ICD-10-CM | POA: Insufficient documentation

## 2021-07-01 DIAGNOSIS — G4733 Obstructive sleep apnea (adult) (pediatric): Secondary | ICD-10-CM | POA: Insufficient documentation

## 2021-07-01 DIAGNOSIS — R7989 Other specified abnormal findings of blood chemistry: Secondary | ICD-10-CM | POA: Insufficient documentation

## 2021-07-01 DIAGNOSIS — R011 Cardiac murmur, unspecified: Secondary | ICD-10-CM | POA: Insufficient documentation

## 2021-07-01 DIAGNOSIS — R002 Palpitations: Secondary | ICD-10-CM | POA: Insufficient documentation

## 2021-07-01 DIAGNOSIS — E785 Hyperlipidemia, unspecified: Secondary | ICD-10-CM | POA: Insufficient documentation

## 2021-07-01 DIAGNOSIS — R0600 Dyspnea, unspecified: Secondary | ICD-10-CM | POA: Insufficient documentation

## 2021-07-01 DIAGNOSIS — Z6839 Body mass index (BMI) 39.0-39.9, adult: Secondary | ICD-10-CM | POA: Insufficient documentation

## 2021-07-01 DIAGNOSIS — Z01818 Encounter for other preprocedural examination: Secondary | ICD-10-CM

## 2021-07-01 DIAGNOSIS — I700 Atherosclerosis of aorta: Secondary | ICD-10-CM | POA: Insufficient documentation

## 2021-07-01 DIAGNOSIS — E669 Obesity, unspecified: Secondary | ICD-10-CM | POA: Insufficient documentation

## 2021-07-01 HISTORY — DX: Hyperglycemia, unspecified: R73.9

## 2021-07-01 HISTORY — DX: Anxiety disorder, unspecified: F41.9

## 2021-07-01 HISTORY — DX: Essential (primary) hypertension: I10

## 2021-07-01 LAB — CBC
HCT: 45.1 % (ref 39.0–52.0)
Hemoglobin: 14.5 g/dL (ref 13.0–17.0)
MCH: 28.2 pg (ref 26.0–34.0)
MCHC: 32.2 g/dL (ref 30.0–36.0)
MCV: 87.6 fL (ref 80.0–100.0)
Platelets: 349 10*3/uL (ref 150–400)
RBC: 5.15 MIL/uL (ref 4.22–5.81)
RDW: 12.5 % (ref 11.5–15.5)
WBC: 10.6 10*3/uL — ABNORMAL HIGH (ref 4.0–10.5)
nRBC: 0 % (ref 0.0–0.2)

## 2021-07-01 LAB — APTT: aPTT: 30 seconds (ref 24–36)

## 2021-07-01 LAB — COMPREHENSIVE METABOLIC PANEL
ALT: 61 U/L — ABNORMAL HIGH (ref 0–44)
AST: 51 U/L — ABNORMAL HIGH (ref 15–41)
Albumin: 4.1 g/dL (ref 3.5–5.0)
Alkaline Phosphatase: 91 U/L (ref 38–126)
Anion gap: 11 (ref 5–15)
BUN: 15 mg/dL (ref 6–20)
CO2: 25 mmol/L (ref 22–32)
Calcium: 9.5 mg/dL (ref 8.9–10.3)
Chloride: 101 mmol/L (ref 98–111)
Creatinine, Ser: 1.01 mg/dL (ref 0.61–1.24)
GFR, Estimated: >60 mL/min (ref >60)
Glucose, Bld: 92 mg/dL (ref 70–99)
Potassium: 4.1 mmol/L (ref 3.5–5.1)
Sodium: 137 mmol/L (ref 135–145)
Total Bilirubin: 0.6 mg/dL (ref 0.3–1.2)
Total Protein: 7.7 g/dL (ref 6.5–8.1)

## 2021-07-01 LAB — TYPE AND SCREEN
ABO/RH(D): O POS
Antibody Screen: NEGATIVE

## 2021-07-01 LAB — SURGICAL PCR SCREEN
MRSA, PCR: NEGATIVE
Staphylococcus aureus: NEGATIVE

## 2021-07-01 LAB — URINALYSIS, ROUTINE W REFLEX MICROSCOPIC
Bilirubin Urine: NEGATIVE
Glucose, UA: NEGATIVE mg/dL
Hgb urine dipstick: NEGATIVE
Ketones, ur: NEGATIVE mg/dL
Leukocytes,Ua: NEGATIVE
Nitrite: NEGATIVE
Protein, ur: NEGATIVE mg/dL
Specific Gravity, Urine: 1.015 (ref 1.005–1.030)
pH: 7.5 (ref 5.0–8.0)

## 2021-07-01 LAB — PROTIME-INR
INR: 0.9 (ref 0.8–1.2)
Prothrombin Time: 11.7 seconds (ref 11.4–15.2)

## 2021-07-01 LAB — HEMOGLOBIN A1C
Hgb A1c MFr Bld: 7.2 % — ABNORMAL HIGH (ref 4.8–5.6)
Mean Plasma Glucose: 159.94 mg/dL

## 2021-07-01 LAB — SARS CORONAVIRUS 2 (TAT 6-24 HRS): SARS Coronavirus 2: NEGATIVE

## 2021-07-01 IMAGING — DX DG CHEST 2V
2 series · 2 of 2 positions shown · non-contrast
Comparison: CT examination dated [DATE].

CLINICAL DATA: Mediastinal mass on left side.  Chest pain.

EXAM:
CHEST - 2 VIEW

[w chest pa]
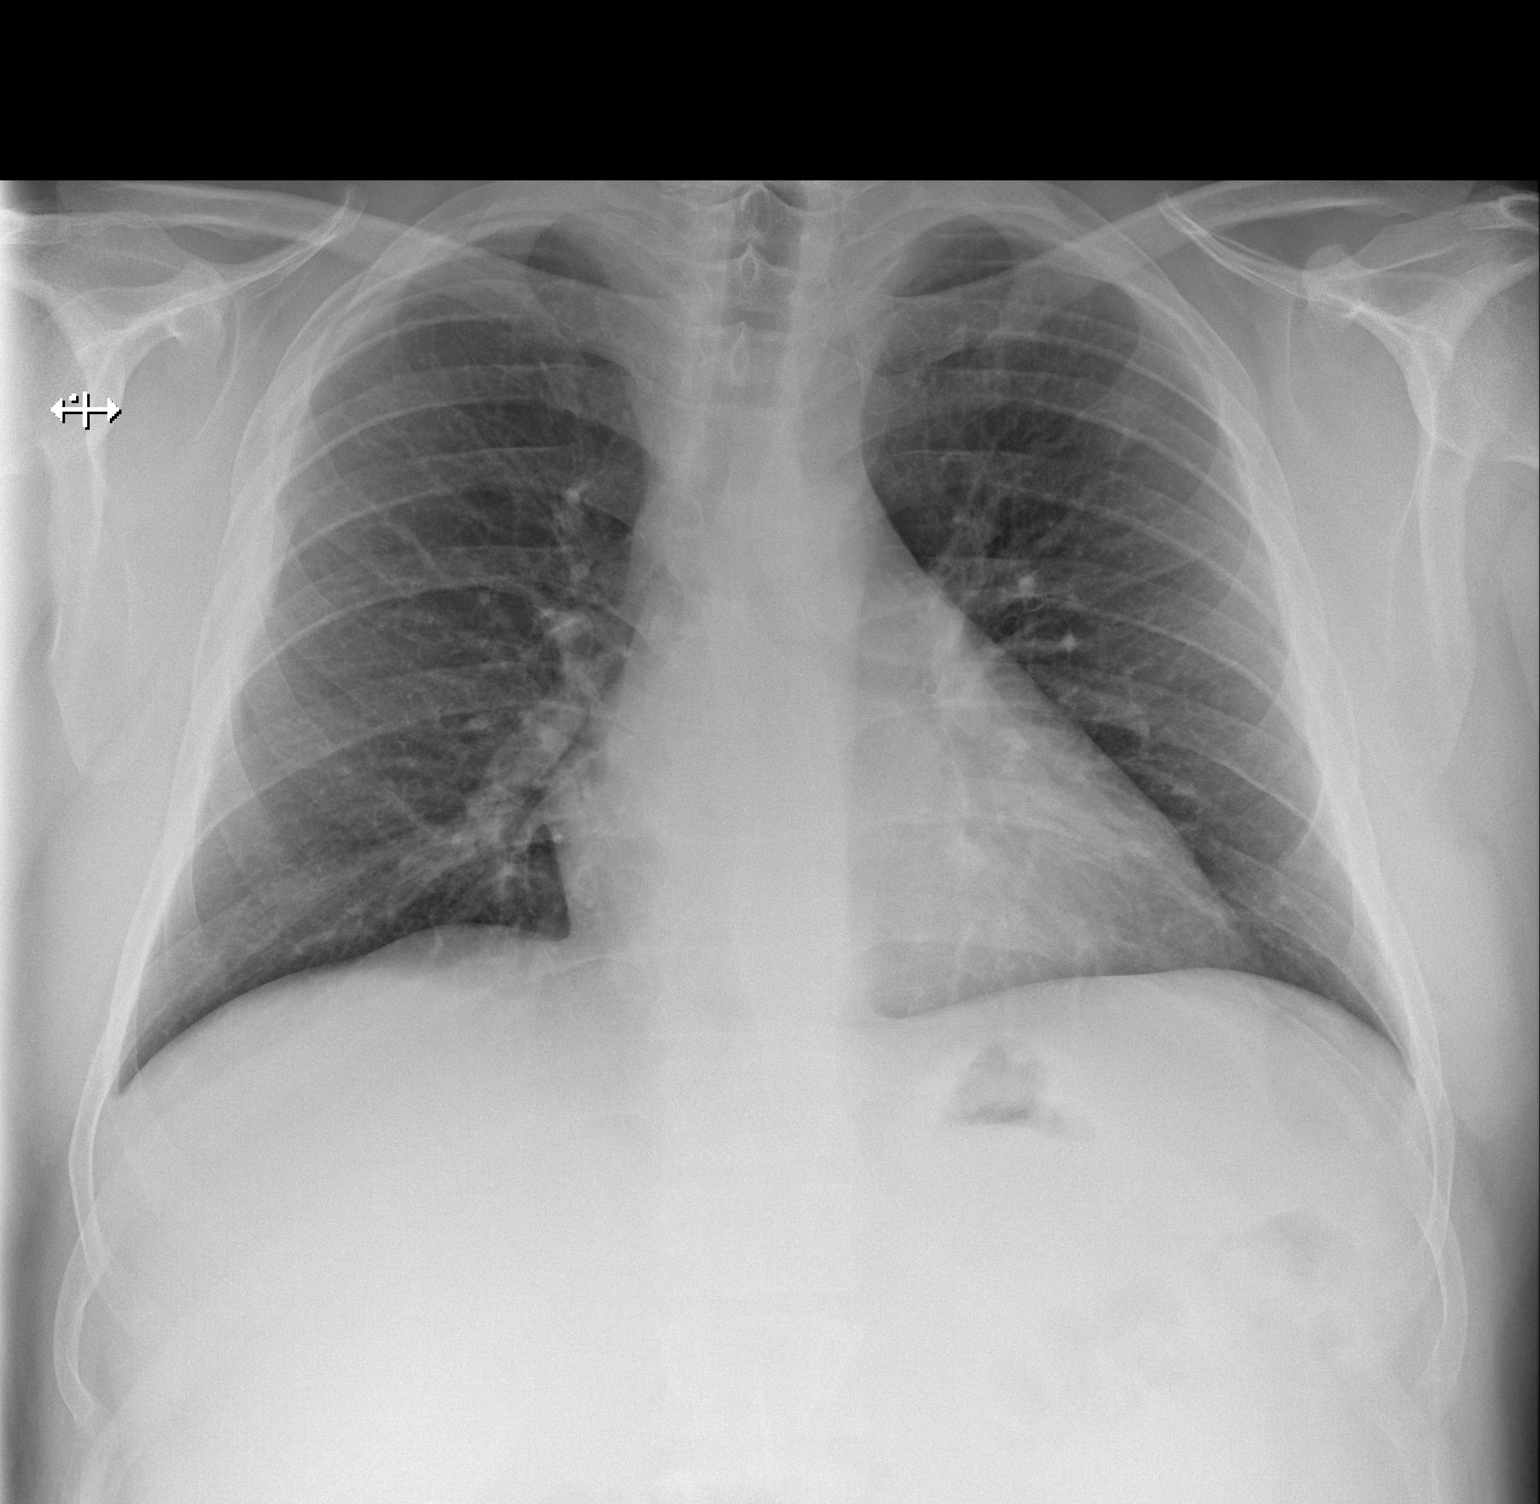

[w chest lat]
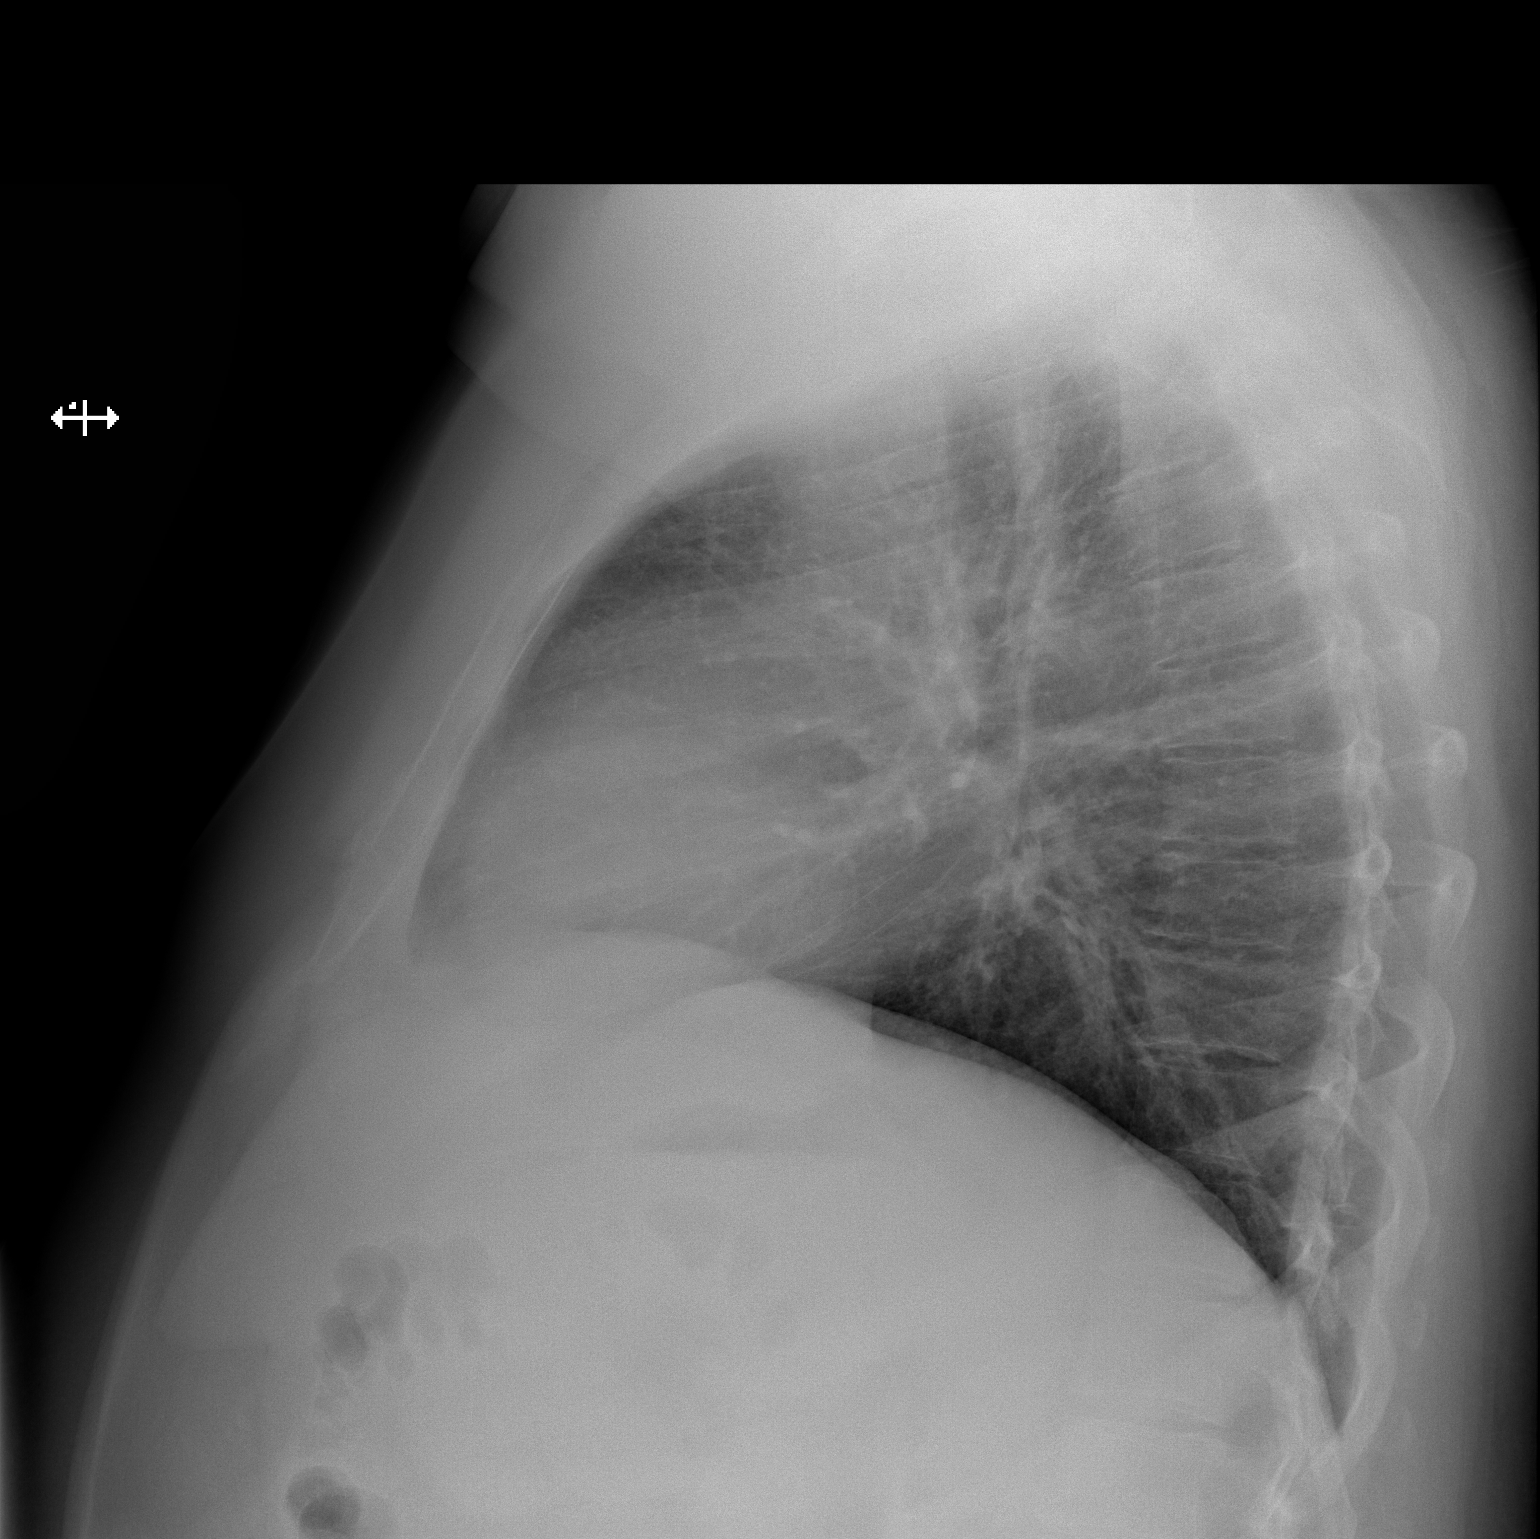

[2 of 2 positions shown; findings below may reference images not displayed]

FINDINGS: The heart size and mediastinal contours are within normal limits.
Hazy opacity left hilar region, likely representing soft tissue
density mass as seen on prior CT examination. Both lungs are clear.
The visualized skeletal structures are unremarkable.
IMPRESSION: 1.  No active cardiopulmonary disease.

2. Hazy opacity in the left hilar region, likely representing soft
tissue density mass, better seen on prior CT examination.

## 2021-07-01 NOTE — Progress Notes (Addendum)
PCP - Dr. Luetta Nutting Cardiologist - Dr. Agustin Cree  PPM/ICD - n/a Device Orders - n/a Rep Notified - n/a  Chest x-ray - 07/01/21 EKG - 06/15/21- Within one month Stress Test - 11/12/20 ECHO - 11/21/20 Cardiac Cath - 11/22/20  Sleep Study - 2019.Moderate OSA. CPAP - Does not have a CPAP. Moderate sleep apnea per patient and he was told he does not need a CPAP.    Fasting Blood Sugar - n/a. Patient denies having diabetes or pre-diabetes. Last A1 C 11/22/20= 6.3. Sigmund Hazel, PA made aware and stated ok to check A1 C today.  Checks Blood Sugar- n/a  Blood Thinner Instructions: n/a Aspirin Instructions: As of today stop taking Aspirin unless otherwise instructed by your surgeon.   ERAS Protcol - No NPO PRE-SURGERY Ensure or G2- n/a  COVID TEST- 07/01/21. Pending.    Anesthesia review: Yes. History of heart murmur. Spoke to Sigmund Hazel, PA and made her aware and she reviewed ECHO, stress, and cath report while patient was at PAT. BP 175/99 upon arrival to PAT. Patient states he is nervous and had to walk from the parking deck. Recheck BP at end of appointment= 136/95.  Patient denies shortness of breath, fever, cough and chest pain at PAT appointment   All instructions explained to the patient, with a verbal understanding of the material. Patient agrees to go over the instructions while at home for a better understanding. The opportunity to ask questions was provided.

## 2021-07-01 NOTE — Progress Notes (Addendum)
Anesthesia Chart Review:  Case: [id number] Date/Time: 07/03/21 1216   Procedure: XI ROBOTIC ASSISTED RESECTION OF MEDIASTINAL MASS (Left)   Anesthesia type: General   Pre-op diagnosis: MEDIASTINAL MASS   Location: [facility] OR ROOM 10 / [facility]   Surgeons: [name], MD       DISCUSSION: Patient is a 46 year old male scheduled for the above procedure. He was initially evaluated by Dr. Roxan Hockey on 06/23/21 for mediastinal mass. He has had ongoing issues with chest pains for ~ 2-3 years, fairly constant. He had a severe episode in 11/2020 prompting ED visit and admission for cardiac cath that showed 20% dLM-pLAD lesion and not felt to be the source of his symptoms. CTA chest showed multilobular densities in the left middle mediastinum, of uncertain etiology bur felt likely benign. Follow-up CTA chest 04/18/21 showed a slight increase in size in the mass and chest MRI recommended to better characterize.  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 Dr. Roxan Hockey thought it may be a cystic thymoma. Since thymoma was on the differential, he did have acetylcholine receptor antibody lab draw on 06/27/2021 (at [facility]) but results are still pending (Dr. Roxan Hockey aware). Robotic assisted left VATS for resection of his mediastinal mass scheduled.   Other history includes never smoker, HTN, dyslipidemia, heart murmur (mild aortic sclerosis without stenosis 11/2020 echo), palpitations, OSA (AHI 3.3/hr but 16.6/hr with REM sleep, did not meet criteria for CPAP, consider oral appliance 04/21/18), exertional dyspnea, CHI/concussion (07/30/14 fell on ice, no LOC). BMI is consistent with obesity. No significant CAD (20% dLM-pLAD) by 11/22/20 LHC. Suspected hepatic steatosis with early cirrhotic change on 04/18/21 CTA.   A1c added to PAT labs due to elevated glucose of 272 by 06/15/21 ED labs and previous A1c in the prediabetic range of 6.3% on 11/22/20. 07/01/21 A1c  results is now up to 7.2% (average glucose 160). He has likely progressed to DM2. Other labs show mildly elevated LFTs with AST 51 and ALT 61, which seems overall consistent with labs since 02/2021. A1c result communicated to [employer]. I left a voice message for Mr. Szeto to call me, as I wanted to review his A1c results. I will also sent a staff message to his PCP Dr. Rodman Key for follow-up purposes.   07/01/20 presurgical COVID-19 test in process.  Anesthesia team to evaluated on the day of surgery. (UPDATE 07/02/21 4:36 PM: COVID-19 test negative. Patient did not return call, so I have not been able to review his A1c results. I did enter an order for DM Coordinator consult and communicated with Thurmond Butts at [facility] regarding Dr. Roxan Hockey or his APP to discuss results with patient during admission.)   VS: BP (!) 136/95    Pulse 86    Temp 36.7 C (Oral)    Resp 18    Ht 6' (1.829 m)    Wt 131.2 kg    SpO2 97%    BMI 39.24 kg/m    PROVIDERS: Luetta Nutting, DO is PCP Jenne Campus, MD is cardiologist. Last visit 01/02/21 for chest pain and SOB follow-up. 11/2020 cath showed only 20% dLM-pLAD lesion and echo showed normal LVEF, normal RVSF, grade II DD.  Continue to increase activity. Continue statin, ASA. Follow-up in six month planned.   LABS: Preoperative labs noted.  (all labs ordered are listed, but only abnormal results are displayed)  Labs Reviewed  CBC - Abnormal; Notable for the following components:  Result Value   WBC 10.6 (*)    All other components within normal limits  COMPREHENSIVE METABOLIC PANEL - Abnormal; Notable for the following components:   AST 51 (*)    ALT 61 (*)    All other components within normal limits  SARS CORONAVIRUS 2 (TAT 6-24 HRS)  SURGICAL PCR SCREEN  PROTIME-INR  APTT  URINALYSIS, ROUTINE W REFLEX MICROSCOPIC  HEMOGLOBIN A1C  TYPE AND SCREEN    Sleep Study/NPSG 04/21/18: IMPRESSIONS - Increased upper airway resistance syndrome (AHI  3.3/h;  but RDI was elevated at 11.9/h); however, there was was moderate sleep apnea during REM sleep (AHI 16.6/h). - No significant central sleep apnea occurred during this study (CAI = 0.0/h). - Mild oxygen desaturation to a nadir of 86.0%. - Reduced sleep efficiency. - Abnormal sleep architecture with absent slow wave sleep, prolonged latency to REM sleep and  reduction in REM sleep. - The patient snored with loud snoring volume. - No cardiac abnormalities were noted during this study. - Increased periodic limb movements of sleep with an index of 21.3. Associated arousals were significant.   DIAGNOSIS - Sleep Apnea, unspecified type G47.30 - Periodic Limb Movement During Sleep (327.51 [G47.61 ICD-10]) - Nocturnal Hypoxemia (327.26 [G47.36 ICD-10])   RECOMMENDATIONS - At present patient does not meet criteria for CPAP; but with moderate sleep apnea during REM sleep consider alternatives to CPAP such as customized oral appliance.  Consider re-evaluation in future if symptoms persist.    IMAGES: CXR 07/01/21:  IMPRESSION: 1.  No active cardiopulmonary disease. 2. Hazy opacity in the left hilar region, likely representing soft tissue density mass, better seen on prior CT examination.   CTA Chest 06/15/21: IMPRESSION: 1. No evidence of pulmonary embolism or acute cardiopulmonary disease. 2. 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 3. Hepatic steatosis. 4. Cholelithiasis.   MRI Chest 05/21/21: IMPRESSION: There is a redemonstrated, multilobulated cystic lesion in the left middle mediastinum. Unfortunately, evaluation of this lesion is somewhat limited by cardiac motion artifact, however it demonstrates intrinsic fluid signal and is without appreciable associated contrast enhancement. This most likely reflects a pericardial cyst or lymphovascular  malformation and is almost certainly benign.   EKG: 06/15/21: SR   CV: Cardiac cath 11/22/20: Dist LM to Prox LAD lesion is 20% stenosed.   No significant coronary obstructive disease with minimal smooth 20% proximal LAD stenosis.   Normal dominant left circumflex coronary artery and nondominant RCA.   LVEDP 18 mmHg.   RECOMMENDATION: Medical therapy.  Suspect chest pain is noncardiac in origin.  Optimal Blood pressure control, and target LDL less than 70.   Echo 11/21/20: IMPRESSIONS     1. Left ventricular ejection fraction, by estimation, is 60 to 65%. The  left ventricle has normal function. Left ventricular endocardial border  not optimally defined to evaluate regional wall motion. Left ventricular  diastolic parameters are consistent  with Grade II diastolic dysfunction (pseudonormalization).   2. Right ventricular systolic function is normal. The right ventricular  size is normal. Tricuspid regurgitation signal is inadequate for assessing  PA pressure.   3. Left atrial size was mildly dilated.   4. The mitral valve was not well visualized. No evidence of mitral valve  regurgitation. No evidence of mitral stenosis.   5. The aortic valve is tricuspid. Aortic valve regurgitation is not  visualized. Mild aortic valve sclerosis is present, with no evidence of  aortic  valve stenosis.   6. The inferior vena cava is normal in size with greater than 50%  respiratory variability, suggesting right atrial pressure of 3 mmHg.   7. Technically difficult study with poor acoustic windows.    Holter monitor 03/17/18: Duration of test:           13 days Indication:                    Palpitations   Baseline rhythm: Sinus rhythm - Minimum heart rate: 45 BPM.  Average heart rate: 81 BPM.  Maximal heart rate 167 BPM. - Atrial arrhythmia: Infrequent premature supraventricular beats with one run of narrow complex tachycardia total of 5 beats only at rate of 145 - Ventricular arrhythmia:  Infrequent premature ventricular beats - Conduction abnormality: None - Symptoms: None   Conclusion:  Normal sinus rhythm with infrequent PVCs and PACs. 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25 MG tablet   clobetasol cream (TEMOVATE) 0.05 %   Coenzyme Q10 (CO Q-10 PO)   fluticasone (FLONASE) 50 MCG/ACT nasal spray   guaiFENesin (MUCINEX) 600 MG 12 hr tablet   OVER THE COUNTER MEDICATION   No current facility-administered medications for this encounter.    Myra Gianotti, PA-C Surgical Short Stay/Anesthesiology [facility] Phone [phone] [facility] Phone [phone] 07/01/2021 5:41 PM

## 2021-07-01 NOTE — Progress Notes (Signed)
Called lab to check on ABG that has not resulted. Spoke to [employer] in the lab who stated ABG was clotted and another one will have to be drawn day of surgery. Levonne Spiller, RN with Dr. Roxan Hockey made aware.

## 2021-07-02 NOTE — Anesthesia Preprocedure Evaluation (Addendum)
Anesthesia Evaluation  Patient identified by MRN, date of birth, ID band Patient awake    Reviewed: Allergy & Precautions, NPO status , Patient's Chart, lab work & pertinent test results  Airway Mallampati: III  TM Distance: >3 FB Neck ROM: Full    Dental no notable dental hx.    Pulmonary sleep apnea ,    Pulmonary exam normal breath sounds clear to auscultation       Cardiovascular hypertension, Pt. on home beta blockers + CAD  Normal cardiovascular exam Rhythm:Regular Rate:Normal  ECHO: Left ventricular ejection fraction, by estimation, is 60 to 65%. The left ventricle has normal function. Left ventricular endocardial border not optimally defined to evaluate regional wall motion. Left ventricular diastolic parameters are consistent with Grade II diastolic dysfunction (pseudonormalization). Right ventricular systolic function is normal. The right ventricular size is normal. Tricuspid regurgitation signal is inadequate for assessing PA pressure. Left atrial size was mildly dilated. The mitral valve was not well visualized. No evidence of mitral valve regurgitation. No evidence of mitral stenosis. The aortic valve is tricuspid. Aortic valve regurgitation is not visualized. Mild aortic valve sclerosis is present, with no evidence of aortic valve stenosis. The inferior vena cava is normal in size with greater than 50% respiratory variability, suggesting right atrial pressure of 3 mmHg. Technically difficult study with poor acoustic windows.   Neuro/Psych Anxiety negative neurological ROS     GI/Hepatic negative GI ROS, Neg liver ROS,   Endo/Other  negative endocrine ROS  Renal/GU negative Renal ROS     Musculoskeletal  (+) Arthritis ,   Abdominal (+) + obese,   Peds  Hematology negative hematology ROS (+)   Anesthesia Other Findings MEDIASTINAL MASS  Reproductive/Obstetrics                            Anesthesia Physical Anesthesia Plan  ASA: 2  Anesthesia Plan: General   Post-op Pain Management:    Induction: Intravenous  PONV Risk Score and Plan: 2 and Ondansetron, Dexamethasone, Midazolam and Treatment may vary due to age or medical condition  Airway Management Planned: Oral ETT  Additional Equipment: Arterial line, CVP and Ultrasound Guidance Line Placement  Intra-op Plan:   Post-operative Plan: Extubation in OR  Informed Consent: I have reviewed the patients History and Physical, chart, labs and discussed the procedure including the risks, benefits and alternatives for the proposed anesthesia with the patient or authorized representative who has indicated his/her understanding and acceptance.     Dental advisory given  Plan Discussed with: CRNA  Anesthesia Plan Comments: (Reviewed PAT note written by Myra Gianotti, PA-C. )      Anesthesia Quick Evaluation

## 2021-07-03 ENCOUNTER — Encounter: Payer: Self-pay | Admitting: Thoracic Surgery (Cardiothoracic Vascular Surgery)

## 2021-07-03 ENCOUNTER — Inpatient Hospital Stay (HOSPITAL_COMMUNITY)
Admission: RE | Admit: 2021-07-03 | Discharge: 2021-07-05 | DRG: 989 | Disposition: A | Discharge status: Home / Self Care | Payer: No Typology Code available for payment source | Source: Ambulatory Visit | Attending: Thoracic Surgery (Cardiothoracic Vascular Surgery) | Admitting: Thoracic Surgery (Cardiothoracic Vascular Surgery)

## 2021-07-03 ENCOUNTER — Other Ambulatory Visit: Payer: Self-pay

## 2021-07-03 ENCOUNTER — Inpatient Hospital Stay (HOSPITAL_COMMUNITY): Payer: No Typology Code available for payment source

## 2021-07-03 ENCOUNTER — Encounter (HOSPITAL_COMMUNITY): Payer: Self-pay | Admitting: Thoracic Surgery (Cardiothoracic Vascular Surgery)

## 2021-07-03 ENCOUNTER — Inpatient Hospital Stay (HOSPITAL_COMMUNITY): Payer: No Typology Code available for payment source | Admitting: Vascular Surgery

## 2021-07-03 ENCOUNTER — Encounter (HOSPITAL_COMMUNITY)
Admission: RE | Disposition: A | Discharge status: Home / Self Care | Payer: Self-pay | Source: Ambulatory Visit | Attending: Thoracic Surgery (Cardiothoracic Vascular Surgery)

## 2021-07-03 DIAGNOSIS — J939 Pneumothorax, unspecified: Secondary | ICD-10-CM

## 2021-07-03 DIAGNOSIS — G8929 Other chronic pain: Secondary | ICD-10-CM | POA: Diagnosis present

## 2021-07-03 DIAGNOSIS — Z888 Allergy status to other drugs, medicaments and biological substances status: Secondary | ICD-10-CM | POA: Diagnosis not present

## 2021-07-03 DIAGNOSIS — I10 Essential (primary) hypertension: Secondary | ICD-10-CM | POA: Diagnosis present

## 2021-07-03 DIAGNOSIS — Z79899 Other long term (current) drug therapy: Secondary | ICD-10-CM

## 2021-07-03 DIAGNOSIS — R7303 Prediabetes: Secondary | ICD-10-CM | POA: Diagnosis present

## 2021-07-03 DIAGNOSIS — Z7982 Long term (current) use of aspirin: Secondary | ICD-10-CM

## 2021-07-03 DIAGNOSIS — I318 Other specified diseases of pericardium: Secondary | ICD-10-CM | POA: Diagnosis present

## 2021-07-03 DIAGNOSIS — E7800 Pure hypercholesterolemia, unspecified: Secondary | ICD-10-CM | POA: Diagnosis present

## 2021-07-03 DIAGNOSIS — Z20822 Contact with and (suspected) exposure to covid-19: Secondary | ICD-10-CM | POA: Diagnosis present

## 2021-07-03 DIAGNOSIS — R222 Localized swelling, mass and lump, trunk: Secondary | ICD-10-CM | POA: Diagnosis present

## 2021-07-03 DIAGNOSIS — I2510 Atherosclerotic heart disease of native coronary artery without angina pectoris: Secondary | ICD-10-CM | POA: Diagnosis present

## 2021-07-03 DIAGNOSIS — D152 Benign neoplasm of mediastinum: Secondary | ICD-10-CM | POA: Diagnosis not present

## 2021-07-03 DIAGNOSIS — E669 Obesity, unspecified: Secondary | ICD-10-CM | POA: Diagnosis present

## 2021-07-03 DIAGNOSIS — Z6839 Body mass index (BMI) 39.0-39.9, adult: Secondary | ICD-10-CM

## 2021-07-03 DIAGNOSIS — J9859 Other diseases of mediastinum, not elsewhere classified: Secondary | ICD-10-CM | POA: Diagnosis present

## 2021-07-03 DIAGNOSIS — G4733 Obstructive sleep apnea (adult) (pediatric): Secondary | ICD-10-CM | POA: Diagnosis present

## 2021-07-03 LAB — BLOOD GAS, ARTERIAL
Acid-base deficit: 1.5 mmol/L (ref 0.0–2.0)
Bicarbonate: 22.6 mmol/L (ref 20.0–28.0)
Drawn by: 59793
FIO2: 21
O2 Saturation: 97.5 %
Patient temperature: 37
pCO2 arterial: 37.3 mmHg (ref 32.0–48.0)
pH, Arterial: 7.399 (ref 7.350–7.450)
pO2, Arterial: 97.3 mmHg (ref 83.0–108.0)

## 2021-07-03 LAB — GLUCOSE, CAPILLARY
Glucose-Capillary: 126 mg/dL — ABNORMAL HIGH (ref 70–99)
Glucose-Capillary: 180 mg/dL — ABNORMAL HIGH (ref 70–99)

## 2021-07-03 LAB — ABO/RH: ABO/RH(D): O POS

## 2021-07-03 IMAGING — DX DG CHEST 1V PORT
1 series · 1 of 1 positions shown · non-contrast
Comparison: Chest x-ray [DATE].  Chest CT [DATE].
COMPARISON: Chest x-ray [DATE].  Chest CT [DATE].

Addendum:
CLINICAL DATA: Postoperative. Mediastinal mass. Evaluate for
pneumothorax.

EXAM:
PORTABLE CHEST 1 VIEW

[chest]
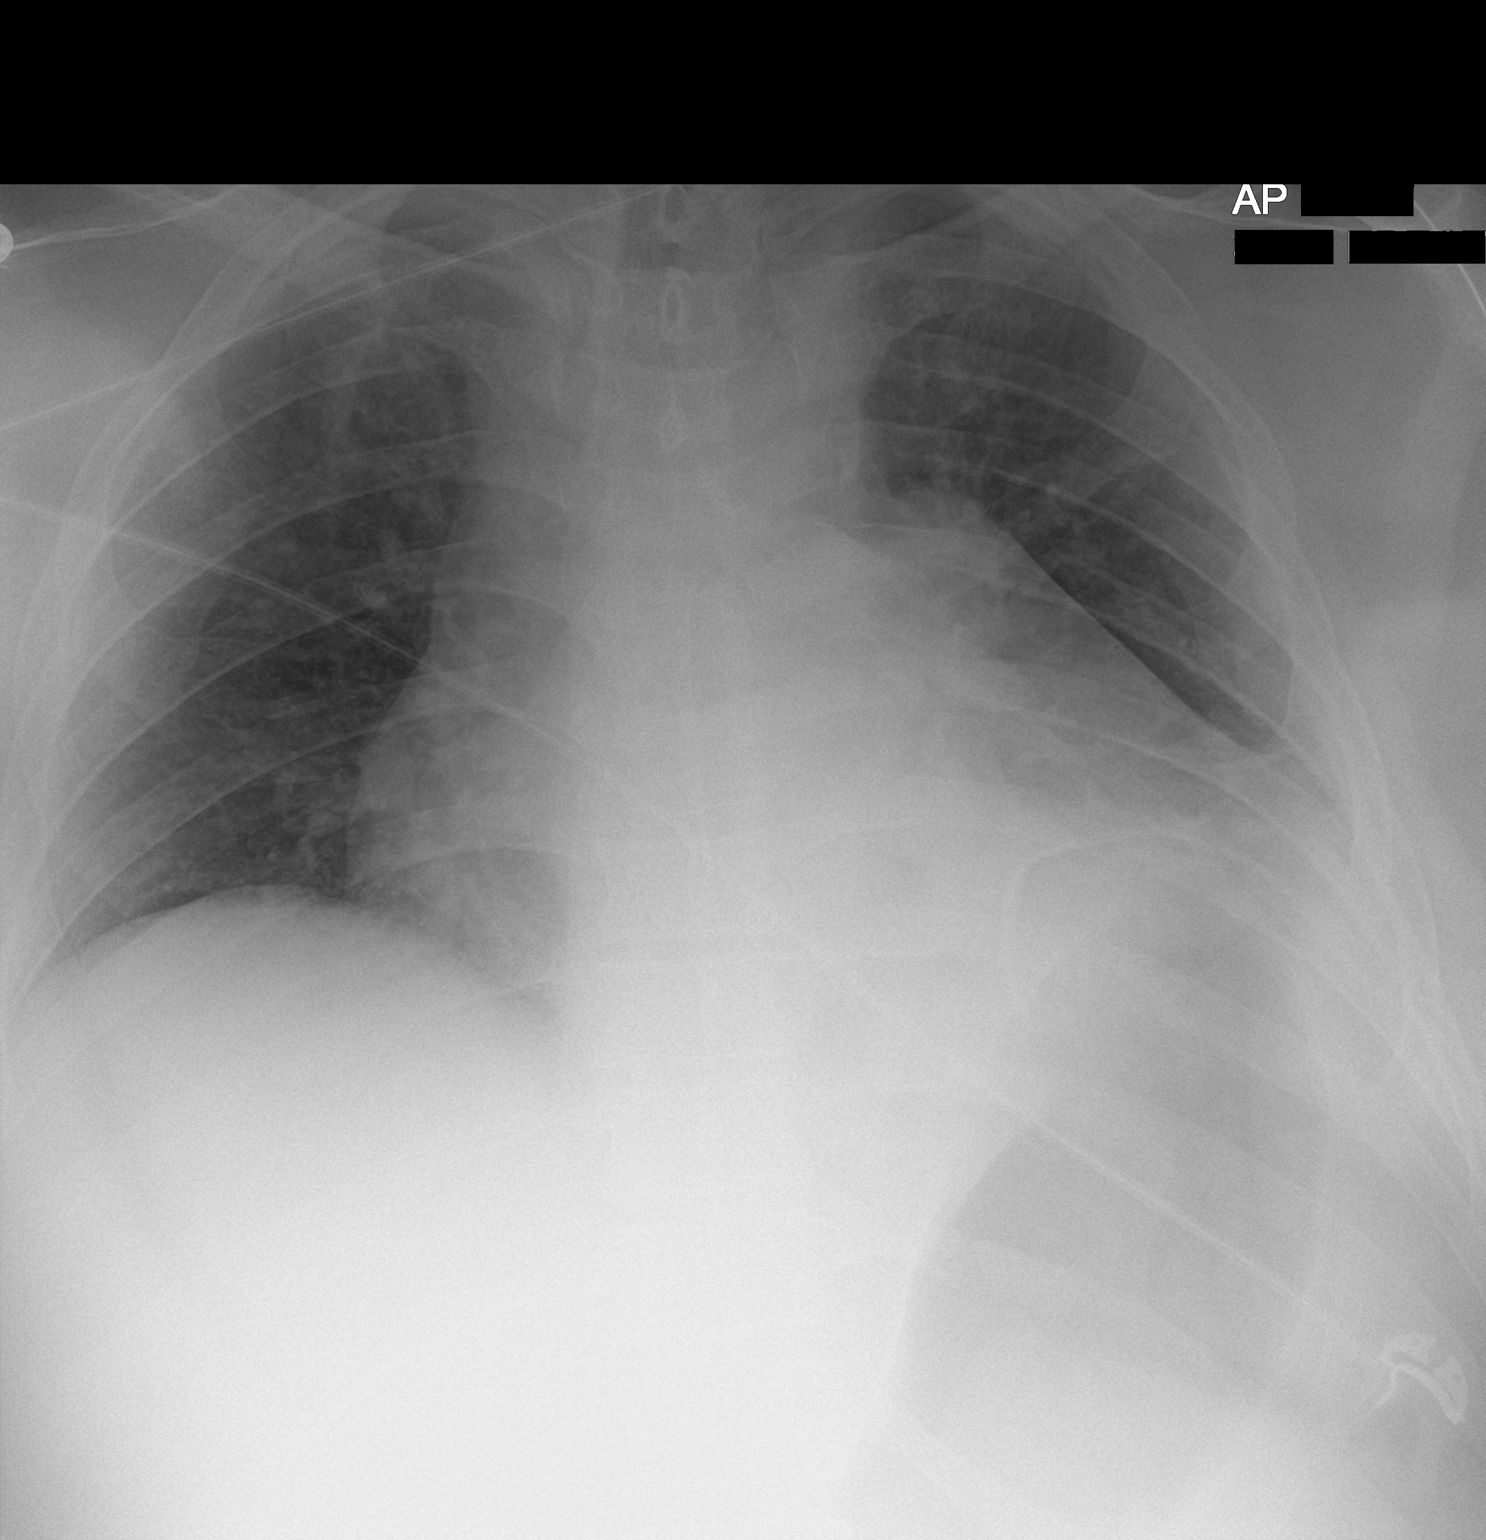

[1 of 1 positions shown; findings below may reference images not displayed]

FINDINGS: Lung volumes are low. Cardiac silhouette appears prominent, likely
related to low lung volumes and patient positioning. There is a
small left pneumothorax no focal lung infiltrate. No acute
fractures.
IMPRESSION: 1. Small left pneumothorax.

ADDENDUM:
These results were called by telephone at the time of interpretation
on [DATE] at [DATE] to provider KLEVER, who verbally
acknowledged these results.

*** End of Addendum ***
FINDINGS: Lung volumes are low. Cardiac silhouette appears prominent, likely
related to low lung volumes and patient positioning. There is a
small left pneumothorax no focal lung infiltrate. No acute
fractures.
IMPRESSION: 1. Small left pneumothorax.

## 2021-07-03 SURGERY — EXCISION, MASS, MEDIASTINUM, ROBOT-ASSISTED
Anesthesia: General | Site: Chest | Laterality: Left

## 2021-07-03 MED ORDER — CEFAZOLIN SODIUM-DEXTROSE 2-4 GM/100ML-% IV SOLN
2.0000 g | Freq: Three times a day (TID) | INTRAVENOUS | Status: AC
  Administered 2021-07-03 – 2021-07-04 (×2): 2 g via INTRAVENOUS
  Filled 2021-07-03 (×2): qty 100

## 2021-07-03 MED ORDER — CEFAZOLIN IN SODIUM CHLORIDE 3-0.9 GM/100ML-% IV SOLN
3.0000 g | INTRAVENOUS | Status: AC
  Administered 2021-07-03: 3 g via INTRAVENOUS
  Filled 2021-07-03: qty 100

## 2021-07-03 MED ORDER — FENTANYL CITRATE (PF) 100 MCG/2ML IJ SOLN
INTRAMUSCULAR | Status: AC
  Filled 2021-07-03: qty 2

## 2021-07-03 MED ORDER — KETOROLAC TROMETHAMINE 15 MG/ML IJ SOLN
INTRAMUSCULAR | Status: AC
  Filled 2021-07-03: qty 1

## 2021-07-03 MED ORDER — SUGAMMADEX SODIUM 200 MG/2ML IV SOLN
INTRAVENOUS | Status: DC | PRN
  Administered 2021-07-03: 400 mg via INTRAVENOUS

## 2021-07-03 MED ORDER — PHENYLEPHRINE 40 MCG/ML (10ML) SYRINGE FOR IV PUSH (FOR BLOOD PRESSURE SUPPORT)
PREFILLED_SYRINGE | INTRAVENOUS | Status: DC | PRN
  Administered 2021-07-03 (×4): 80 ug via INTRAVENOUS

## 2021-07-03 MED ORDER — AMISULPRIDE (ANTIEMETIC) 5 MG/2ML IV SOLN: 10.0000 mg | Freq: Once | INTRAVENOUS | Status: DC | PRN

## 2021-07-03 MED ORDER — LIDOCAINE 2% (20 MG/ML) 5 ML SYRINGE
INTRAMUSCULAR | Status: AC
  Filled 2021-07-03: qty 5

## 2021-07-03 MED ORDER — MIDAZOLAM HCL 2 MG/2ML IJ SOLN
INTRAMUSCULAR | Status: AC
  Filled 2021-07-03: qty 2

## 2021-07-03 MED ORDER — LIDOCAINE 2% (20 MG/ML) 5 ML SYRINGE
INTRAMUSCULAR | Status: DC | PRN
Start: 2021-07-03 — End: 2021-07-03
  Administered 2021-07-03: 60 mg via INTRAVENOUS

## 2021-07-03 MED ORDER — ONDANSETRON HCL 4 MG/2ML IJ SOLN
INTRAMUSCULAR | Status: AC
  Filled 2021-07-03: qty 2

## 2021-07-03 MED ORDER — OXYCODONE HCL 5 MG PO TABS
5.0000 mg | ORAL_TABLET | ORAL | Status: DC | PRN
  Administered 2021-07-04: 10 mg via ORAL
  Administered 2021-07-04: 5 mg via ORAL
  Filled 2021-07-03 (×2): qty 2

## 2021-07-03 MED ORDER — FENTANYL CITRATE PF 50 MCG/ML IJ SOSY
25.0000 ug | PREFILLED_SYRINGE | INTRAMUSCULAR | Status: DC | PRN
  Administered 2021-07-03 – 2021-07-04 (×3): 50 ug via INTRAVENOUS
  Filled 2021-07-03 (×3): qty 1

## 2021-07-03 MED ORDER — KETOROLAC TROMETHAMINE 15 MG/ML IJ SOLN
15.0000 mg | Freq: Four times a day (QID) | INTRAMUSCULAR | Status: DC
  Administered 2021-07-03 – 2021-07-05 (×6): 15 mg via INTRAVENOUS
  Filled 2021-07-03 (×6): qty 1

## 2021-07-03 MED ORDER — ACETAMINOPHEN 10 MG/ML IV SOLN
1000.0000 mg | Freq: Once | INTRAVENOUS | Status: DC | PRN
  Administered 2021-07-03: 1000 mg via INTRAVENOUS

## 2021-07-03 MED ORDER — CARVEDILOL 6.25 MG PO TABS
6.2500 mg | ORAL_TABLET | Freq: Once | ORAL | Status: AC
  Administered 2021-07-03: 6.25 mg via ORAL
  Filled 2021-07-03 (×2): qty 1

## 2021-07-03 MED ORDER — BUPIVACAINE LIPOSOME 1.3 % IJ SUSP
INTRAMUSCULAR | Status: AC
  Filled 2021-07-03: qty 20

## 2021-07-03 MED ORDER — ROCURONIUM BROMIDE 10 MG/ML (PF) SYRINGE
PREFILLED_SYRINGE | INTRAVENOUS | Status: DC | PRN
  Administered 2021-07-03: 20 mg via INTRAVENOUS
  Administered 2021-07-03 (×3): 10 mg via INTRAVENOUS
  Administered 2021-07-03: 20 mg via INTRAVENOUS
  Administered 2021-07-03: 100 mg via INTRAVENOUS
  Administered 2021-07-03: 10 mg via INTRAVENOUS

## 2021-07-03 MED ORDER — CHLORHEXIDINE GLUCONATE 0.12 % MT SOLN
15.0000 mL | Freq: Once | OROMUCOSAL | Status: AC
  Administered 2021-07-03: 15 mL via OROMUCOSAL
  Filled 2021-07-03: qty 15

## 2021-07-03 MED ORDER — PROPOFOL 10 MG/ML IV BOLUS
INTRAVENOUS | Status: AC
  Filled 2021-07-03: qty 20

## 2021-07-03 MED ORDER — SODIUM CHLORIDE 0.9 % IV SOLN
INTRAVENOUS | Status: AC | PRN
  Administered 2021-07-03: 1000 mL

## 2021-07-03 MED ORDER — ATORVASTATIN CALCIUM 40 MG PO TABS
40.0000 mg | ORAL_TABLET | Freq: Every day | ORAL | Status: DC
  Administered 2021-07-04 – 2021-07-05 (×2): 40 mg via ORAL
  Filled 2021-07-03 (×2): qty 1

## 2021-07-03 MED ORDER — BUPIVACAINE LIPOSOME 1.3 % IJ SUSP
INTRAMUSCULAR | Status: DC | PRN
  Administered 2021-07-03: 42 mL

## 2021-07-03 MED ORDER — ONDANSETRON HCL 4 MG/2ML IJ SOLN
INTRAMUSCULAR | Status: DC | PRN
  Administered 2021-07-03: 4 mg via INTRAVENOUS

## 2021-07-03 MED ORDER — BISACODYL 5 MG PO TBEC
10.0000 mg | DELAYED_RELEASE_TABLET | Freq: Every day | ORAL | Status: DC
  Administered 2021-07-05: 10 mg via ORAL
  Filled 2021-07-03 (×2): qty 2

## 2021-07-03 MED ORDER — ENOXAPARIN SODIUM 40 MG/0.4ML IJ SOSY
40.0000 mg | PREFILLED_SYRINGE | Freq: Every day | INTRAMUSCULAR | Status: DC
  Administered 2021-07-04 – 2021-07-05 (×2): 40 mg via SUBCUTANEOUS
  Filled 2021-07-03 (×2): qty 0.4

## 2021-07-03 MED ORDER — ACETAMINOPHEN 500 MG PO TABS
1000.0000 mg | ORAL_TABLET | Freq: Four times a day (QID) | ORAL | Status: DC
  Administered 2021-07-03 – 2021-07-05 (×6): 1000 mg via ORAL
  Filled 2021-07-03 (×5): qty 2

## 2021-07-03 MED ORDER — ACETAMINOPHEN 10 MG/ML IV SOLN
INTRAVENOUS | Status: AC
  Filled 2021-07-03: qty 100

## 2021-07-03 MED ORDER — ONDANSETRON HCL 4 MG/2ML IJ SOLN: 4.0000 mg | Freq: Four times a day (QID) | INTRAMUSCULAR | Status: DC | PRN

## 2021-07-03 MED ORDER — SENNOSIDES-DOCUSATE SODIUM 8.6-50 MG PO TABS
1.0000 | ORAL_TABLET | Freq: Every day | ORAL | Status: DC
  Administered 2021-07-03 – 2021-07-04 (×2): 1 via ORAL
  Filled 2021-07-03 (×2): qty 1

## 2021-07-03 MED ORDER — FENTANYL CITRATE (PF) 100 MCG/2ML IJ SOLN
INTRAMUSCULAR | Status: DC | PRN
  Administered 2021-07-03: 50 ug via INTRAVENOUS
  Administered 2021-07-03: 100 ug via INTRAVENOUS
  Administered 2021-07-03: 50 ug via INTRAVENOUS
  Administered 2021-07-03: 150 ug via INTRAVENOUS

## 2021-07-03 MED ORDER — PROMETHAZINE HCL 25 MG/ML IJ SOLN: 6.2500 mg | INTRAMUSCULAR | Status: DC | PRN

## 2021-07-03 MED ORDER — CARVEDILOL 6.25 MG PO TABS
6.2500 mg | ORAL_TABLET | Freq: Two times a day (BID) | ORAL | Status: DC
  Administered 2021-07-04 – 2021-07-05 (×3): 6.25 mg via ORAL
  Filled 2021-07-03 (×3): qty 1

## 2021-07-03 MED ORDER — ORAL CARE MOUTH RINSE: 15.0000 mL | Freq: Once | OROMUCOSAL | Status: AC

## 2021-07-03 MED ORDER — FENTANYL CITRATE (PF) 100 MCG/2ML IJ SOLN
25.0000 ug | INTRAMUSCULAR | Status: DC | PRN
  Administered 2021-07-03: 25 ug via INTRAVENOUS
  Administered 2021-07-03: 50 ug via INTRAVENOUS
  Administered 2021-07-03: 25 ug via INTRAVENOUS
  Administered 2021-07-03: 50 ug via INTRAVENOUS

## 2021-07-03 MED ORDER — BUPIVACAINE HCL (PF) 0.5 % IJ SOLN
INTRAMUSCULAR | Status: AC
  Filled 2021-07-03: qty 30

## 2021-07-03 MED ORDER — ROCURONIUM BROMIDE 10 MG/ML (PF) SYRINGE
PREFILLED_SYRINGE | INTRAVENOUS | Status: AC
  Filled 2021-07-03: qty 10

## 2021-07-03 MED ORDER — FENTANYL CITRATE (PF) 250 MCG/5ML IJ SOLN
INTRAMUSCULAR | Status: AC
  Filled 2021-07-03: qty 5

## 2021-07-03 MED ORDER — PHENYLEPHRINE 40 MCG/ML (10ML) SYRINGE FOR IV PUSH (FOR BLOOD PRESSURE SUPPORT)
PREFILLED_SYRINGE | INTRAVENOUS | Status: AC
  Filled 2021-07-03: qty 10

## 2021-07-03 MED ORDER — KETOROLAC TROMETHAMINE 15 MG/ML IJ SOLN
15.0000 mg | Freq: Four times a day (QID) | INTRAMUSCULAR | Status: DC
  Administered 2021-07-03: 15 mg via INTRAVENOUS

## 2021-07-03 MED ORDER — MIDAZOLAM HCL 2 MG/2ML IJ SOLN
INTRAMUSCULAR | Status: DC | PRN
  Administered 2021-07-03: 2 mg via INTRAVENOUS

## 2021-07-03 MED ORDER — OXYCODONE HCL 5 MG/5ML PO SOLN: 5.0000 mg | Freq: Once | ORAL | Status: DC | PRN

## 2021-07-03 MED ORDER — 0.9 % SODIUM CHLORIDE (POUR BTL) OPTIME
TOPICAL | Status: DC | PRN
  Administered 2021-07-03: 2000 mL

## 2021-07-03 MED ORDER — OXYCODONE HCL 5 MG PO TABS: 5.0000 mg | ORAL_TABLET | Freq: Once | ORAL | Status: DC | PRN

## 2021-07-03 MED ORDER — ASPIRIN EC 81 MG PO TBEC
81.0000 mg | DELAYED_RELEASE_TABLET | Freq: Every day | ORAL | Status: DC
  Administered 2021-07-04 – 2021-07-05 (×2): 81 mg via ORAL
  Filled 2021-07-03 (×2): qty 1

## 2021-07-03 MED ORDER — ACETAMINOPHEN 160 MG/5ML PO SOLN: 1000.0000 mg | Freq: Four times a day (QID) | ORAL | Status: DC

## 2021-07-03 MED ORDER — DEXAMETHASONE SODIUM PHOSPHATE 10 MG/ML IJ SOLN
INTRAMUSCULAR | Status: AC
  Filled 2021-07-03: qty 1

## 2021-07-03 MED ORDER — PROPOFOL 10 MG/ML IV BOLUS
INTRAVENOUS | Status: DC | PRN
  Administered 2021-07-03: 200 mg via INTRAVENOUS

## 2021-07-03 MED ORDER — DEXAMETHASONE SODIUM PHOSPHATE 10 MG/ML IJ SOLN
INTRAMUSCULAR | Status: DC | PRN
  Administered 2021-07-03: 5 mg via INTRAVENOUS

## 2021-07-03 MED ORDER — LACTATED RINGERS IV SOLN: INTRAVENOUS | Status: DC

## 2021-07-03 SURGICAL SUPPLY — 78 items
BLADE STERNUM SYSTEM 6 (BLADE) IMPLANT
CANNULA REDUC XI 12-8 STAPL (CANNULA) ×1
CANNULA REDUCER 12-8 DVNC XI (CANNULA) ×1 IMPLANT
CATH THORACIC 36FR (CATHETERS) IMPLANT
CATH THORACIC 36FR RT ANG (CATHETERS) IMPLANT
CONN ST 1/4X3/8  BEN (MISCELLANEOUS) ×1
CONN ST 1/4X3/8 BEN (MISCELLANEOUS) IMPLANT
COVER TIP SHEARS 8 DVNC (MISCELLANEOUS) IMPLANT
COVER TIP SHEARS 8MM DA VINCI (MISCELLANEOUS)
DEFOGGER SCOPE WARMER CLEARIFY (MISCELLANEOUS) ×2 IMPLANT
DERMABOND ADVANCED (GAUZE/BANDAGES/DRESSINGS) ×1
DERMABOND ADVANCED .7 DNX12 (GAUZE/BANDAGES/DRESSINGS) IMPLANT
DRAIN CHANNEL 28F RND 3/8 FF (WOUND CARE) ×1 IMPLANT
DRAPE ARM DVNC X/XI (DISPOSABLE) ×4 IMPLANT
DRAPE COLUMN DVNC XI (DISPOSABLE) ×1 IMPLANT
DRAPE CV SPLIT W-CLR ANES SCRN (DRAPES) ×2 IMPLANT
DRAPE DA VINCI XI ARM (DISPOSABLE) ×4
DRAPE DA VINCI XI COLUMN (DISPOSABLE) ×1
DRAPE HALF SHEET 40X57 (DRAPES) ×4 IMPLANT
DRAPE INCISE 23X17 IOBAN STRL (DRAPES) ×2
DRAPE INCISE 23X17 STRL (DRAPES) IMPLANT
DRAPE INCISE IOBAN 23X17 STRL (DRAPES) ×2 IMPLANT
DRAPE ORTHO SPLIT 77X108 STRL (DRAPES) ×1
DRAPE SURG ORHT 6 SPLT 77X108 (DRAPES) ×1 IMPLANT
DRSG AQUACEL AG ADV 3.5X14 (GAUZE/BANDAGES/DRESSINGS) IMPLANT
ELECT REM PT RETURN 9FT ADLT (ELECTROSURGICAL)
ELECTRODE REM PT RTRN 9FT ADLT (ELECTROSURGICAL) IMPLANT
FELT TEFLON 1X6 (MISCELLANEOUS) IMPLANT
GAUZE 4X4 16PLY ~~LOC~~+RFID DBL (SPONGE) ×2 IMPLANT
GAUZE KITTNER 4X5 RF (MISCELLANEOUS) ×2 IMPLANT
GAUZE SPONGE 4X4 12PLY STRL (GAUZE/BANDAGES/DRESSINGS) IMPLANT
GLOVE SURG MICRO LTX SZ7.5 (GLOVE) ×4 IMPLANT
GOWN STRL REUS W/ TWL LRG LVL3 (GOWN DISPOSABLE) ×1 IMPLANT
GOWN STRL REUS W/ TWL XL LVL3 (GOWN DISPOSABLE) ×1 IMPLANT
GOWN STRL REUS W/TWL 2XL LVL3 (GOWN DISPOSABLE) ×2 IMPLANT
GOWN STRL REUS W/TWL LRG LVL3 (GOWN DISPOSABLE) ×1
GOWN STRL REUS W/TWL XL LVL3 (GOWN DISPOSABLE) ×1
HEMOSTAT POWDER SURGIFOAM 1G (HEMOSTASIS) IMPLANT
HEMOSTAT SURGICEL 2X14 (HEMOSTASIS) ×4 IMPLANT
IRRIGATION STRYKERFLOW (MISCELLANEOUS) ×2 IMPLANT
IRRIGATOR STRYKERFLOW (MISCELLANEOUS) ×4
KIT SUCTION CATH 14FR (SUCTIONS) IMPLANT
NDL 25GX 5/8IN NON SAFETY (NEEDLE) ×1 IMPLANT
NDL SPNL 22GX3.5 QUINCKE BK (NEEDLE) ×1 IMPLANT
NEEDLE 25GX 5/8IN NON SAFETY (NEEDLE) ×2 IMPLANT
NEEDLE SPNL 22GX3.5 QUINCKE BK (NEEDLE) ×2 IMPLANT
PACK CHEST (CUSTOM PROCEDURE TRAY) ×2 IMPLANT
PAD ARMBOARD 7.5X6 YLW CONV (MISCELLANEOUS) ×4 IMPLANT
PAD ELECT DEFIB RADIOL ZOLL (MISCELLANEOUS) IMPLANT
SEAL CANN UNIV 5-8 DVNC XI (MISCELLANEOUS) ×3 IMPLANT
SEAL XI 5MM-8MM UNIVERSAL (MISCELLANEOUS) ×4
SEALER SYNCHRO 8 IS4000 DV (MISCELLANEOUS) ×1
SEALER SYNCHRO 8 IS4000 DVNC (MISCELLANEOUS) IMPLANT
SET TRI-LUMEN FLTR TB AIRSEAL (TUBING) ×2 IMPLANT
SOLUTION ELECTROLUBE (MISCELLANEOUS) ×1 IMPLANT
SPONGE T-LAP 18X18 ~~LOC~~+RFID (SPONGE) ×8 IMPLANT
SPONGE T-LAP 4X18 ~~LOC~~+RFID (SPONGE) ×2 IMPLANT
STAPLER CANNULA SEAL DVNC XI (STAPLE) IMPLANT
STAPLER CANNULA SEAL XI (STAPLE) ×1
SUT BONE WAX W31G (SUTURE) IMPLANT
SUT SILK  1 MH (SUTURE) ×1
SUT SILK 1 MH (SUTURE) IMPLANT
SUT SILK 2 0 SH CR/8 (SUTURE) IMPLANT
SUT STEEL 6MS V (SUTURE) IMPLANT
SUT STEEL SZ 6 DBL 3X14 BALL (SUTURE) IMPLANT
SUT VIC AB 1 CTX 36 (SUTURE)
SUT VIC AB 1 CTX36XBRD ANBCTR (SUTURE) IMPLANT
SUT VIC AB 2-0 CTX 36 (SUTURE) IMPLANT
SUT VIC AB 3-0 X1 27 (SUTURE) ×4 IMPLANT
SUT VICRYL 0 TIES 12 18 (SUTURE) ×1 IMPLANT
SUT VICRYL 0 UR6 27IN ABS (SUTURE) ×4 IMPLANT
SYR 20CC LL (SYRINGE) ×4 IMPLANT
SYSTEM RETRIEVAL ANCHOR 8 (MISCELLANEOUS) ×1 IMPLANT
SYSTEM SAHARA CHEST DRAIN ATS (WOUND CARE) IMPLANT
TOWEL GREEN STERILE (TOWEL DISPOSABLE) IMPLANT
TOWEL GREEN STERILE FF (TOWEL DISPOSABLE) IMPLANT
TRAY FOLEY SLVR 16FR TEMP STAT (SET/KITS/TRAYS/PACK) IMPLANT
TROCAR PORT AIRSEAL 12X150 (TUBING) ×2 IMPLANT

## 2021-07-03 NOTE — Brief Op Note (Addendum)
07/03/2021  3:54 PM  PATIENT:  Joshua Brady  46 y.o. male  PRE-OPERATIVE DIAGNOSIS:  MEDIASTINAL MASS  POST-OPERATIVE DIAGNOSIS:  MEDIASTINAL MASS  PROCEDURE:  XI ROBOTIC ASSISTED LEFT VATS for RESECTION OF MEDIASTINAL MASS   SURGEON:  Surgeon(s) and Role:    [name], MD - Primary  PHYSICIAN ASSISTANT: Lars Pinks PA-C  ANESTHESIA:   general  EBL:  50 mL   BLOOD ADMINISTERED:none  DRAINS:  25 Blake drain placed in the left pleural space    LOCAL MEDICATIONS USED:  OTHER Exparel  SPECIMEN:  Source of Specimen:  Mediastinal mass  DISPOSITION OF SPECIMEN:  PATHOLOGY  COUNTS CORRECT:  YES  DICTATION: .Dragon Dictation  PLAN OF CARE: Admit to inpatient   PATIENT DISPOSITION:  PACU - hemodynamically stable.   Delay start of Pharmacological VTE agent (>24hrs) due to surgical blood loss or risk of bleeding: no

## 2021-07-03 NOTE — Anesthesia Postprocedure Evaluation (Signed)
Anesthesia Post Note  Patient: Joshua Brady  Procedure(s) Performed: XI ROBOTIC ASSISTED RESECTION OF MEDIASTINAL MASS (Left: Chest)     Patient location during evaluation: PACU Anesthesia Type: General Level of consciousness: awake and alert Pain management: pain level controlled Vital Signs Assessment: post-procedure vital signs reviewed and stable Respiratory status: spontaneous breathing, nonlabored ventilation, respiratory function stable and patient connected to nasal cannula oxygen Cardiovascular status: blood pressure returned to baseline and stable Postop Assessment: no apparent nausea or vomiting Anesthetic complications: no   No notable events documented.  Last Vitals:  Vitals:   07/03/21 1715 07/03/21 1730  BP: (!) 162/97 (!) 158/101  Pulse: 92 93  Resp: 15 14  Temp:    SpO2: 97% 96%    Last Pain:  Vitals:   07/03/21 1730  TempSrc:   PainSc: [name]

## 2021-07-03 NOTE — Discharge Summary (Addendum)
Physician Discharge Summary       [address]       [place] [address]             [phone]    Patient ID: Joshua Brady MRN: [id number] DOB/AGE: [birth year] 48 y.o.  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 Roxan Hockey on 07/03/2021.  Pathology: Results pending   HPI: This is a 46 year old gentleman with a history of chronic chest pain, dyspnea, hypertension, hyperlipidemia, obesity, obstructive sleep apnea, low testosterone, heart murmur and closed head injury.  He has been suffering from chest pain for about 2 years now.  He describes this as a rather constant pressure in the chest.  He has it both at rest and with exertion.  He says is pretty much constant.  He also has shortness of breath with even mild exertion.  Afterwards he is very fatigued.  Back in the summer he was having a very severe episode.  He was admitted and had a cardiac work-up including an echocardiogram and catheterization.  No cardiac source was found for his pain.  He did not have any significant coronary disease.  As part of his work-up he had a CT of the chest which showed a "middle" mediastinal mass/cyst.   A follow-up CT showed no significant change in size.  An MR showed fluid density in part and suggest that this was a benign lesion.  He recently had another severe episode and went to the emergency room.  Another CT showed the lesion to be approximately the same or possibly slightly smaller than it was back in June.  He complains of fatigue but no double vision or difficulty swallowing.  He says that his chest wall is tender to touch and his wife says that sometimes he will have pain even if she hugs him.  It is unclear  if any of his symptoms are related to the mediastinal mass. Dr. Roxan Hockey offered him the option of robotic assisted left VATS for resection of the mediastinal mass. Potential risks, complications, and benefits of the surgery were discussed with the patient and he agreed to proceed.  Hospital Course: Patient underwent Xi robotic assisted left VATS for resection of mediastinal mass on 07/03/2021. He was extubated and transferred from the OR to PACU in stable condition. Chest tube was placed to water seal and there was no air leak. CXR showed low lung volumes, no pneumothorax. Chest tube was removed. Follow up chest x ray showed small pneumotx but o/w stable appearance.It was reviewed by Dr Roxan Hockey.  Patient was ambulating on room air. He was tolerating a diet. All wounds are clean, dry, and healing without  sign of infection. Patient is felt surgically stable for discharge today.     Latest Vital Signs: Blood pressure (!) 147/78, pulse (!) 103, temperature 98.5 F (36.9 C), temperature source Oral, resp. rate 17, height 6' (1.829 m), weight 131.4 kg, SpO2 92 %.  Physical Exam: General appearance: alert, cooperative, and no distress Neurologic: intact Heart: regular rate and rhythm Lungs: diminished breath sounds right base Wound: some bruising around incisions  Discharge Condition:Stable and discharged to home.  Recent laboratory studies:  Lab Results  Component Value  Date   WBC 13.2 (H) 07/05/2021   HGB 13.0 07/05/2021   HCT 39.1 07/05/2021   MCV 86.9 07/05/2021   PLT 270 07/05/2021   Lab Results  Component Value Date   NA 135 07/05/2021   K 4.0 07/05/2021   CL 103 07/05/2021   CO2 25 07/05/2021   CREATININE 0.85 07/05/2021   GLUCOSE 191 (H) 07/05/2021      Diagnostic Studies: DG Chest 2 View  Result Date: 07/01/2021 CLINICAL DATA:  Mediastinal mass on left side.  Chest pain. EXAM: CHEST - 2 VIEW COMPARISON:  CT examination dated June 15, 2021. FINDINGS: The heart size and mediastinal contours are within normal limits. Hazy opacity left hilar region, likely representing soft tissue density mass as seen on prior CT examination. Both lungs are clear. The visualized skeletal structures are unremarkable. IMPRESSION: 1.  No active cardiopulmonary disease. 2. Hazy opacity in the left hilar region, likely representing soft tissue density mass, better seen on prior CT examination. Electronically Signed   By: Keane Police D.O.   On: 07/01/2021 16:40   CT ANGIO CHEST PE W OR WO CONTRAST  Result Date: 06/15/2021 CLINICAL DATA:  Diagnosed with mass to the left chest in October 2022 with worsening chest pain. EXAM: CT ANGIOGRAPHY CHEST WITH CONTRAST TECHNIQUE: Multidetector CT imaging of the chest was performed using the standard protocol during bolus administration of intravenous contrast. Multiplanar CT image reconstructions and MIPs were obtained to evaluate the vascular anatomy. CONTRAST:  163mL OMNIPAQUE IOHEXOL 350 MG/ML SOLN COMPARISON:  April 18, 2021 FINDINGS: Cardiovascular: Satisfactory opacification of the pulmonary arteries to the segmental level. No evidence of pulmonary embolism. Normal heart size. No pericardial effusion. Mediastinum/Nodes: A 4.9 cm x 4.7 cm x 2.2 cm nonenhancing, macrolobulated area of low attenuation (approximately 7.98 Hounsfield units) is again seen adjacent to the left atrial appendage. Thyroid gland, trachea, and esophagus  demonstrate no significant findings. Lungs/Pleura: Lungs are clear. No pleural effusion or pneumothorax. Upper Abdomen: There is diffuse fatty infiltration of the liver parenchyma. Multiple subcentimeter gallstones are seen within a contracted gallbladder. Musculoskeletal: No chest wall abnormality. No acute or significant osseous findings. Review of the MIP images confirms the above findings. IMPRESSION: 1. No evidence of pulmonary embolism or acute cardiopulmonary disease. 2. 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 3. Hepatic steatosis. 4. Cholelithiasis. Electronically Signed   By: Virgina Norfolk M.D.   On: 06/15/2021 21:30   DG Chest Port 1 View  Result Date: 07/04/2021 CLINICAL DATA:  46 year old male postoperative day 1 status post VATS and resection of cystic mediastinal mass. EXAM: PORTABLE CHEST 1 VIEW COMPARISON:  Portable chest 07/03/2021 and earlier. FINDINGS: Portable AP semi upright view at 0610 hours. Stable postoperative mediastinal contour, with mild contour defect at the AP window. Visualized tracheal air column is within normal limits. Lower lung volumes. No definite pneumothorax now. Left basilar  chest tube is in place. No confluent pulmonary opacity. No acute osseous abnormality identified. Negative visible bowel gas. IMPRESSION: 1. Left chest tube in place with no definite pneumothorax. 2. Lower lung volumes. Postoperative contour change to the mediastinum. Electronically Signed   By: Genevie Ann M.D.   On: 07/04/2021 09:05   DG Chest Port 1 View  Addendum Date: 07/03/2021   ADDENDUM REPORT: 07/03/2021 18:07 ADDENDUM: These results were called by telephone at the time of interpretation on 07/03/2021 at 5:56 pm to provider Ellwood Handler, who verbally acknowledged these results. Electronically Signed   By: Ronney Asters M.D.   On: 07/03/2021 18:07    Result Date: 07/03/2021 CLINICAL DATA:  Postoperative. Mediastinal mass. Evaluate for pneumothorax. EXAM: PORTABLE CHEST 1 VIEW COMPARISON:  Chest x-ray 07/01/2021.  Chest CT 06/15/2021. FINDINGS: Lung volumes are low. Cardiac silhouette appears prominent, likely related to low lung volumes and patient positioning. There is a small left pneumothorax no focal lung infiltrate. No acute fractures. IMPRESSION: 1. Small left pneumothorax. Electronically Signed: By: Ronney Asters M.D. On: 07/03/2021 17:14   DG Chest Port 1V same Day  Result Date: 07/04/2021 CLINICAL DATA:  Left chest tube removal EXAM: PORTABLE CHEST 1 VIEW COMPARISON:  Previous studies including the examination of 07/04/2021 FINDINGS: Transverse diameter of heart is increased. There is homogeneous density in the medial left lung which has not changed. This may suggest atelectasis. There is interval removal of left chest tube. There is possible trace amount pneumothorax along the lateral margin of left upper lung fields. There is no demonstrable apical pneumothorax. There are linear densities in the left lower lung fields. Left hemidiaphragm is elevated. IMPRESSION: Interval removal of left chest tube. There is possible tiny loculated pneumothorax along the lateral margin of left upper lung fields. There is no evidence of apical pneumothorax. Patchy infiltrates are seen in the left lower lung fields suggesting atelectasis/pneumonia. Left hemidiaphragm is elevated due to decreased volume in the left lung. Electronically Signed   By: Elmer Picker M.D.   On: 07/04/2021 12:43    Discharge Instructions     Discharge patient   Complete by: As directed    Discharge disposition: 01-Home or Self Care   Discharge patient date: 07/05/2021      Discharge Medications: Allergies as of 07/05/2021       Reactions   Other    Ants - see notes from ER visit 02/2018   Prednisone    Topiramate    Anxiety, dizzy, depressed, suicidal thoughts,  agitated        Medication List     STOP taking these medications    guaiFENesin 600 MG 12 hr tablet Commonly known as: Mucinex       TAKE these medications    aspirin EC 81 MG tablet Take 1 tablet (81 mg total) by mouth daily. Swallow whole.   atorvastatin 40 MG tablet Commonly known as: LIPITOR Take 1 tablet (40 mg total) by mouth daily.   carvedilol 6.25 MG tablet Commonly known as: COREG Take 1 tablet (6.25 mg total) by mouth 2 (two) times daily with a meal.   clobetasol cream 0.05 % Commonly known as: TEMOVATE Apply 1 application on to the skin 2 (two) times daily.   CO Q-10 PO Take 1 tablet by mouth daily at 12 noon. Unknown strenght   fluticasone 50 MCG/ACT nasal spray Commonly known as: FLONASE Place 2 sprays into each nostril once daily   OVER THE COUNTER MEDICATION Take 1 tablet  by mouth daily at 12 noon. Beet root/Unknown strength   traMADol 50 MG tablet Commonly known as: Ultram Take 1 tablet (50 mg total) by mouth every 6 (six) hours as needed for up to 7 days.        Follow Up Appointments:  Follow-up Information     [name], MD. Go on 07/29/2021.   Specialty: Cardiothoracic Surgery Why: PA/LAT CXR to be taken (at [facility] which is in the same building as Dr. Leonarda Salon office, on the ground floor) on 02/14 30 minutes prior to office appointment;Appointment time is at 12:45 pm Contact information: [address]  [place] [address] [phone]         [facility] [place]. Go on 07/17/2021.   Specialty: Cardiothoracic Surgery Why: Appointment is for chest tube suture removal only. Appointment time is at 11:30 am Contact information: [place], [place] [phone]                Signed: Gaspar Bidding 07/06/2021, 10:10 AM

## 2021-07-03 NOTE — Interval H&P Note (Signed)
History and Physical Interval Note: Acetylcholine receptor antibodies negative  07/03/2021 12:06 PM  Joshua Brady  has presented today for surgery, with the diagnosis of MEDIASTINAL MASS.  The various methods of treatment have been discussed with the patient and family. After consideration of risks, benefits and other options for treatment, the patient has consented to  Procedure(s): XI ROBOTIC ASSISTED RESECTION OF MEDIASTINAL MASS (Left) as a surgical intervention.  The patient's history has been reviewed, patient examined, no change in status, stable for surgery.  I have reviewed the patient's chart and labs.  Questions were answered to the patient's satisfaction.     [name]

## 2021-07-03 NOTE — Transfer of Care (Signed)
Immediate Anesthesia Transfer of Care Note  Patient: Joshua Brady  Procedure(s) Performed: XI ROBOTIC ASSISTED RESECTION OF MEDIASTINAL MASS (Left: Chest)  Patient Location: PACU  Anesthesia Type:General  Level of Consciousness: drowsy and patient cooperative  Airway & Oxygen Therapy: Patient Spontanous Breathing and Patient connected to nasal cannula oxygen  Post-op Assessment: Report given to RN, Post -op Vital signs reviewed and stable and Patient moving all extremities  Post vital signs: Reviewed and stable  Last Vitals:  Vitals Value Taken Time  BP 154/108 07/03/21 1608  Temp    Pulse 92 07/03/21 1609  Resp 16 07/03/21 1609  SpO2 93 % 07/03/21 1609  Vitals shown include unvalidated device data.  Last Pain:  Vitals:   07/03/21 1032  TempSrc:   PainSc: 0-No pain      Patients Stated Pain Goal: 1 (70/26/37 8588)  Complications: No notable events documented.

## 2021-07-03 NOTE — Anesthesia Procedure Notes (Signed)
Procedure Name: Intubation Date/Time: 07/03/2021 12:43 PM Performed by: Moshe Salisbury, CRNA Pre-anesthesia Checklist: Patient identified, Emergency Drugs available, Suction available and Patient being monitored Patient Re-evaluated:Patient Re-evaluated prior to induction Oxygen Delivery Method: Circle System Utilized Preoxygenation: Pre-oxygenation with 100% oxygen Induction Type: IV induction Ventilation: Mask ventilation without difficulty Laryngoscope Size: Mac and 4 Grade View: Grade III Tube type: Oral Endobronchial tube: Left, Double lumen EBT, EBT position confirmed by auscultation and EBT position confirmed by fiberoptic bronchoscope and 39 Fr Number of attempts: 2 (First attempt into esophagus and immediately recognized and removed. Second attempt through vc.) Airway Equipment and Method: Stylet Placement Confirmation: ETT inserted through vocal cords under direct vision, positive ETCO2 and breath sounds checked- equal and bilateral Secured at: 29 cm Tube secured with: Tape Dental Injury: Teeth and Oropharynx as per pre-operative assessment

## 2021-07-03 NOTE — Progress Notes (Signed)
BP 168/113 upon arrival to Short Stay. Rechecked at 1045. BP 146/94. Patient states he did not take his am dose of Carvedilol 6.25 mg.  Dr. Roanna Banning made aware. Verbal order received to give patient Carvedilol 6.25 mg.

## 2021-07-03 NOTE — Op Note (Signed)
NAMEBECKY, BERBERIAN MEDICAL RECORD NO: [id number] ACCOUNT NO: [id number] DATE OF BIRTH: 01-30-1976 FACILITY: [facility] LOCATION: [facility] PHYSICIAN: Revonda Standard. Roxan Hockey, MD  Operative Report   DATE OF PROCEDURE: 07/03/2021  PREOPERATIVE DIAGNOSIS:  Cystic mediastinal mass.  POSTOPERATIVE DIAGNOSIS:  Cystic mediastinal mass.  PROCEDURE:  Robotic left VATS resection of mediastinal mass.  SURGEON:  Modesto Charon, MD  ASSISTANT: Lars Pinks, PA-C.  ANESTHESIA:  General.  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 Phrenic nerve preserved.  CLINICAL NOTE: Mr. Franchini is a 46 year old gentleman who presented with a 2-year history of chest pain.  He had a cardiac workup, which was negative.  A CT of the chest showed a mediastinal cystic mass. On followup, there was no significant change. MRI  showed fluid density suggesting a benign lesion. Even though it was unclear if the mass was related to his symptoms, he was offered surgical resection.  He wished to proceed.  DESCRIPTION OF PROCEDURE: Mr. Astorino was brought to the operating room on 07/03/2021.  He had induction of general anesthesia, and was intubated with a double lumen endotracheal tube.  Intravenous antibiotics were administered.  A Foley catheter was  placed.  Sequential compression devices were placed on the calves for DVT prophylaxis.  The left chest was elevated slightly and the chest was prepped and draped in the usual sterile fashion.  A Bair Hugger was in place for active warming.  A timeout was performed.  A solution containing 20 mL of liposomal bupivacaine and 30 mL of 0.5% bupivacaine was prepared. This was used for local at the incision sites.  Incision was made in the fifth interspace anterolaterally.  Chest was entered  bluntly using a hemostat.  An 8 mm port was inserted.  The thoracoscope was advanced in the chest.  Carbon dioxide was  insufflated per protocol.  Additional ports were placed in the 3rd and approximately the eighth interspace, again along the anterior  axillary line. Long ports were necessary due to the patient's body habitus. There was a large fat pad on the pericardium.  There was good deflation of the right lung.  Dissection was begun at the transition from the pericardial fat pad to the mediastinal  fat pad/cystic mass.  On viewing the mass, it was clear that this appeared to be multiple cysts encased in fatty tissue.  The cysts were relatively thin walled.  The dissection proceeded slowly due to the fat and difficulty with getting into the appropriate plane given the amount  of mediastinal fat.  Initially it was difficult to determine where the phrenic nerve was. As the dissection was carried along the inferior edge of the mass towards the hilum of the lung, the phrenic nerve was identified.  It was completely encircled by the  mass.  The mass was dissected off the phrenic nerve along its entire length and the phrenic nerve was preserved.  A plane then was developed anteriorly and dissection continued from inferior to superior. As larger feeding vessels were encountered, they  were divided with the SynchroSeal device.  The mass extended into the aortopulmonary window and dissection was carried along there.  There were nodes in the AP window that were removed along with the mass.  The recurrent nerve was identified and  preserved while dissecting in the AP window.  After the mass had been completely freed up, the robot was undocked.  The single sponge that had been placed during the procedure was  removed.  The specimen was manipulated into an 8 mm endoscopic retrieval  bag and removed through the original port incision.  The chest was copiously irrigated with saline.  Inspection was made for hemostasis.  There was no apparent bleeding.  A 28-French Blake drain was placed through the most inferior incision and directed   superiorly.  The scope was withdrawn.  The remaining ports were removed.  Dual lung ventilation was resumed.  The specimen was sent to pathology as a permanent only.  The incisions were closed in standard fashion.  All sponge, needle and instrument  counts were correct at the end of the procedure.  Experienced assistance was necessary for this case due to complexity.  Scheryl Darter served as the assistant and assisted with port placement, instrument exchange, suctioning, and wound closure.   The patient then was extubated in the operating room and taken to the postanesthetic care unit in good condition.   MUK D: 07/03/2021 6:08:31 pm T: 07/03/2021 11:52:00 pm  JOB: [id number]/ [id number]

## 2021-07-03 NOTE — Hospital Course (Addendum)
HPI: This is a 46 year old gentleman with a history of chronic chest pain, dyspnea, hypertension, hyperlipidemia, obesity, obstructive sleep apnea, low testosterone, heart murmur and closed head injury.  He has been suffering from chest pain for about 2 years now.  He describes this as a rather constant pressure in the chest.  He has it both at rest and with exertion.  He says is pretty much constant.  He also has shortness of breath with even mild exertion.  Afterwards he is very fatigued.  Back in the summer he was having a very severe episode.  He was admitted and had a cardiac work-up including an echocardiogram and catheterization.  No cardiac source was found for his pain.  He did not have any significant coronary disease.  As part of his work-up he had a CT of the chest which showed a "middle" mediastinal mass/cyst.  A follow-up CT showed no significant change in size.  An MR showed fluid density in part and suggest that this was a benign lesion.  He recently had another severe episode and went to the emergency room.  Another CT showed the lesion to be approximately the same or possibly slightly smaller than it was back in June.  He complains of fatigue but no double vision or difficulty swallowing.  He says that his chest wall is tender to touch and his wife says that sometimes he will have pain even if she hugs him.  It is unclear  if any of his symptoms are related to the mediastinal mass. Dr. Roxan Hockey offered him the option of robotic assisted left VATS for resection of the mediastinal mass. Potential risks, complications, and benefits of the surgery were discussed with the patient and he agreed to proceed.  Hospital Course: Patient underwent Xi robotic assisted left VATS for resection of mediastinal mass on 07/03/2021. He was extubated and transferred from the OR to PACU in stable condition. Chest tube was placed to water seal and there was no air leak. CXR showed low lung volumes, no  pneumothorax. Chest tube was removed. Follow up chest x ray showed small pneumotx but o/w stable appearance.It was reviewed by Dr Roxan Hockey.  Patient was ambulating on room air. He was tolerating a diet. All wounds are clean, dry, and healing without sign of infection. Patient is felt surgically stable for discharge today.

## 2021-07-03 NOTE — Progress Notes (Signed)
Inpatient Diabetes Program Recommendations  AACE/ADA: New Consensus Statement on Inpatient Glycemic Control  Target Ranges:  Prepandial:   less than 140 mg/dL      Peak postprandial:   less than 180 mg/dL (1-2 hours)      Critically ill patients:  140 - 180 mg/dL    Latest Reference Range & Units 11/22/20 04:23 02/27/21 07:09 06/15/21 19:20 07/01/21 12:57 07/01/21 14:00  Glucose 70 - 99 mg/dL 111 (H) 147 (H) 272 (H) 92   Hemoglobin A1C 4.8 - 5.6 % 6.3 (H)    7.2 (H)    Review of Glycemic Control  Diabetes history: No Outpatient Diabetes medications: NA Current orders for Inpatient glycemic control: CBG monitoring   Inpatient Diabetes Program Recommendations:    Insulin: Please consider ordering Novolog 0-9 units Q4H.  HbgA1C: A1C 7.2% on 07/01/21 indicating an average glucose of 160 mg/dl over the past 2-3 months. Prior A1C noted in chart was 6.3% on 11/22/20. IF attending provider plans to dx patient with new DM dx please inform patient and nursing staff so patient can be educated on DM while inpatient.  NOTE: Noted consult for diabetes coordinator for "He is for resection of mediastinal mass on 07/03/21 ~ 12:30 PM. No reported DM history. Last A1c in June was consistent with pre-diabetes. A1c with pre-op labs 7.2%. Result called to Dr. Leonarda Salon office and forwarded to PCP already. As of 07/02/21 9:34 AM, still waiting call back from Mr. Lance to review results, but you can check my note for updates.  Myra Gianotti, PA-C [phone] (Pre-Anesthesia Testing)." Patient is currently listed in [facility] OR for mediastinal mass resection. Initial glucose 126 mg/dl today at 10:21 am. Patient received Decadron 5 mg at 13:53 today which is likely to contribute to hyperglycemia. Diabetes coordinator will plan to follow up with patient on 07/04/21 regarding A1C.  Thanks, Barnie Alderman, RN, MSN, CDE Diabetes Coordinator Inpatient Diabetes Program [phone] (Team Pager from 8am to 5pm)

## 2021-07-04 ENCOUNTER — Inpatient Hospital Stay (HOSPITAL_COMMUNITY): Payer: No Typology Code available for payment source

## 2021-07-04 LAB — BASIC METABOLIC PANEL
Anion gap: 10 (ref 5–15)
BUN: 17 mg/dL (ref 6–20)
CO2: 23 mmol/L (ref 22–32)
Calcium: 9.1 mg/dL (ref 8.9–10.3)
Chloride: 102 mmol/L (ref 98–111)
Creatinine, Ser: 1.15 mg/dL (ref 0.61–1.24)
GFR, Estimated: >60 mL/min (ref >60)
Glucose, Bld: 221 mg/dL — ABNORMAL HIGH (ref 70–99)
Potassium: 4.6 mmol/L (ref 3.5–5.1)
Sodium: 135 mmol/L (ref 135–145)

## 2021-07-04 LAB — CBC
HCT: 41.8 % (ref 39.0–52.0)
Hemoglobin: 13.8 g/dL (ref 13.0–17.0)
MCH: 28.4 pg (ref 26.0–34.0)
MCHC: 33 g/dL (ref 30.0–36.0)
MCV: 86 fL (ref 80.0–100.0)
Platelets: 300 10*3/uL (ref 150–400)
RBC: 4.86 MIL/uL (ref 4.22–5.81)
RDW: 12.7 % (ref 11.5–15.5)
WBC: 14.2 10*3/uL — ABNORMAL HIGH (ref 4.0–10.5)
nRBC: 0 % (ref 0.0–0.2)

## 2021-07-04 IMAGING — DX DG CHEST 1V PORT SAME DAY
1 series · 1 of 1 positions shown · non-contrast
Comparison: Previous studies including the examination of
[DATE]

CLINICAL DATA: Left chest tube removal

EXAM:
PORTABLE CHEST 1 VIEW

[chest ap]
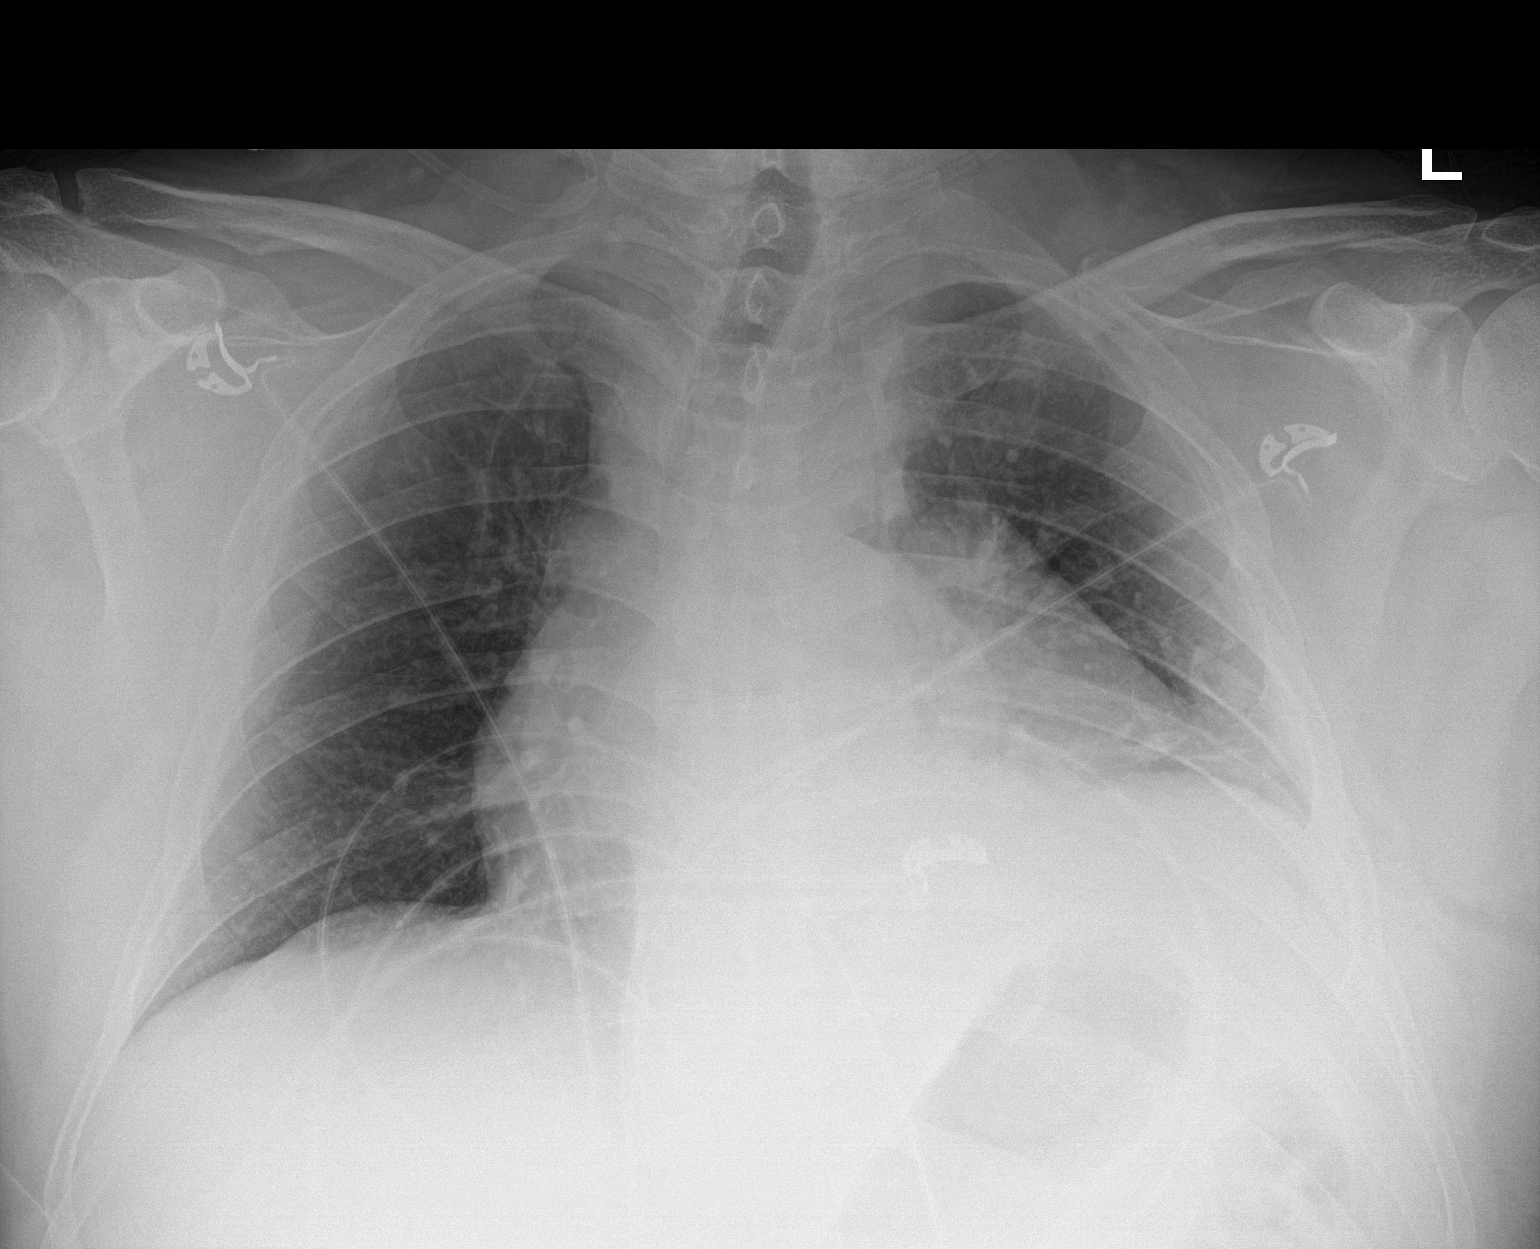

[1 of 1 positions shown; findings below may reference images not displayed]

FINDINGS: Transverse diameter of heart is increased. There is homogeneous
density in the medial left lung which has not changed. This may
suggest atelectasis. There is interval removal of left chest tube.
There is possible trace amount pneumothorax along the lateral margin
of left upper lung fields. There is no demonstrable apical
pneumothorax. There are linear densities in the left lower lung
fields. Left hemidiaphragm is elevated.
IMPRESSION: Interval removal of left chest tube. There is possible tiny
loculated pneumothorax along the lateral margin of left upper lung
fields. There is no evidence of apical pneumothorax. Patchy
infiltrates are seen in the left lower lung fields suggesting
atelectasis/pneumonia. Left hemidiaphragm is elevated due to
decreased volume in the left lung.

## 2021-07-04 NOTE — Progress Notes (Addendum)
° °   °  [address]       [place] [address]             [phone]       1 Day Post-Op Procedure(s) (LRB): XI ROBOTIC ASSISTED RESECTION OF MEDIASTINAL MASS (Left)  Subjective: Patient with incisional/left chest tube pain this am. He already ate breakfast without problems  Objective: Vital signs in last 24 hours: Temp:  [97.7 F (36.5 C)-97.9 F (36.6 C)] 97.7 F (36.5 C) (01/20 0415) Pulse Rate:  [75-102] 99 (01/20 0415) Cardiac Rhythm: Normal sinus rhythm (01/20 0700) Resp:  [14-20] 20 (01/20 0415) BP: (131-168)/(80-113) 159/96 (01/20 0415) SpO2:  [92 %-98 %] 95 % (01/20 0415) Arterial Line BP: (163-165)/(88-105) 163/105 (01/19 1630) Weight:  [131.2 kg-131.4 kg] 131.4 kg (01/19 2121)     Intake/Output from previous day: 01/19 0701 - 01/20 0700 In: 1600 [I.V.:1600] Out: 875 [Urine:725; Blood:50; Chest Tube:100]   Physical Exam:  Cardiovascular: RRR Pulmonary: Clear to auscultation bilaterally Abdomen: Soft, non tender, bowel sounds present. Extremities: SCDs in place Wounds: Clean and dry.  No erythema or signs of infection. Chest Tube: to water seal,no air leak  Lab Results: CBC: Recent Labs    07/01/21 1257 07/04/21 0059  WBC 10.6* 14.2*  HGB 14.5 13.8  HCT 45.1 41.8  PLT 349 300   BMET:  Recent Labs    07/01/21 1257 07/04/21 0059  NA 137 135  K 4.1 4.6  CL 101 102  CO2 25 23  GLUCOSE 92 221*  BUN 15 17  CREATININE 1.01 1.15  CALCIUM 9.5 9.1    PT/INR:  Recent Labs    07/01/21 1257  LABPROT 11.7  INR 0.9   ABG:  INR: Will add last result for INR, ABG once components are confirmed Will add last 4 CBG results once components are confirmed  Assessment/Plan:  1. CV - ST. On Coreg 6.25 mg bid as taken prior to surgery 2.  Pulmonary - On 2 liters of oxygen via [place]. Wean as able over next 1-2 days. Chest tube with 100 cc since surgery. Chest tube is to water seal and no air leak. CXR this am appears to show trace left  apical pneumothorax. Will remove chest tube.Await pathology. Encourage incentive spirometer 3. Decrease IVF, remove foley 4. Lovenox for DVT prophylaxis 5. Ambulate tid  Donielle M ZimmermanPA-C 07/04/2021,7:40 AM [phone]   Patient seen and examined, agree with above Likely has some loculated fluid medially in chest Left hemidiaphragm slightly elevated Dc chest tube  Remo Lipps C. Roxan Hockey, MD [facility] [phone]

## 2021-07-04 NOTE — Progress Notes (Signed)
SATURATION QUALIFICATIONS: (This note is used to comply with regulatory documentation for home oxygen)  Patient Saturations on Room Air at Rest = 93%  Patient Saturations on Room Air while Ambulating = 93%    

## 2021-07-04 NOTE — Plan of Care (Signed)
°  Problem: Education: Goal: Knowledge of General Education information will improve Description: Including pain rating scale, medication(s)/side effects and non-pharmacologic comfort measures Outcome: Progressing   Problem: Health Behavior/Discharge Planning: Goal: Ability to manage health-related needs will improve Outcome: Progressing   Problem: Clinical Measurements: Goal: Ability to maintain clinical measurements within normal limits will improve Outcome: Progressing Goal: Will remain free from infection Outcome: Progressing Goal: Diagnostic test results will improve Outcome: Progressing Goal: Cardiovascular complication will be avoided Outcome: Progressing   Problem: Activity: Goal: Risk for activity intolerance will decrease Outcome: Progressing   Problem: Nutrition: Goal: Adequate nutrition will be maintained Outcome: Progressing   Problem: Coping: Goal: Level of anxiety will decrease Outcome: Progressing   Problem: Elimination: Goal: Will not experience complications related to bowel motility Outcome: Progressing Goal: Will not experience complications related to urinary retention Outcome: Progressing   Problem: Safety: Goal: Ability to remain free from injury will improve Outcome: Progressing   Problem: Skin Integrity: Goal: Risk for impaired skin integrity will decrease Outcome: Progressing   Problem: Clinical Measurements: Goal: Respiratory complications will improve Outcome: Not Progressing   Problem: Pain Managment: Goal: General experience of comfort will improve Outcome: Not Progressing

## 2021-07-05 LAB — COMPREHENSIVE METABOLIC PANEL
ALT: 49 U/L — ABNORMAL HIGH (ref 0–44)
AST: 44 U/L — ABNORMAL HIGH (ref 15–41)
Albumin: 3.2 g/dL — ABNORMAL LOW (ref 3.5–5.0)
Alkaline Phosphatase: 71 U/L (ref 38–126)
Anion gap: 7 (ref 5–15)
BUN: 16 mg/dL (ref 6–20)
CO2: 25 mmol/L (ref 22–32)
Calcium: 8.8 mg/dL — ABNORMAL LOW (ref 8.9–10.3)
Chloride: 103 mmol/L (ref 98–111)
Creatinine, Ser: 0.85 mg/dL (ref 0.61–1.24)
GFR, Estimated: >60 mL/min (ref >60)
Glucose, Bld: 191 mg/dL — ABNORMAL HIGH (ref 70–99)
Potassium: 4 mmol/L (ref 3.5–5.1)
Sodium: 135 mmol/L (ref 135–145)
Total Bilirubin: 0.5 mg/dL (ref 0.3–1.2)
Total Protein: 6.4 g/dL — ABNORMAL LOW (ref 6.5–8.1)

## 2021-07-05 LAB — CBC
HCT: 39.1 % (ref 39.0–52.0)
Hemoglobin: 13 g/dL (ref 13.0–17.0)
MCH: 28.9 pg (ref 26.0–34.0)
MCHC: 33.2 g/dL (ref 30.0–36.0)
MCV: 86.9 fL (ref 80.0–100.0)
Platelets: 270 10*3/uL (ref 150–400)
RBC: 4.5 MIL/uL (ref 4.22–5.81)
RDW: 12.8 % (ref 11.5–15.5)
WBC: 13.2 10*3/uL — ABNORMAL HIGH (ref 4.0–10.5)
nRBC: 0 % (ref 0.0–0.2)

## 2021-07-05 MED ORDER — TRAMADOL HCL 50 MG PO TABS: 50.0000 mg | ORAL_TABLET | Freq: Four times a day (QID) | ORAL | 0 refills | Status: AC | PRN

## 2021-07-05 NOTE — Plan of Care (Signed)

## 2021-07-05 NOTE — Progress Notes (Signed)
PIV removed. Discharge instructions completed. Patient verbalized understanding of medication regimen, follow up appointments and discharge instructions. Patient belongings gathered and packed to discharge.  

## 2021-07-05 NOTE — Progress Notes (Signed)
2 Days Post-Op Procedure(s) (LRB): XI ROBOTIC ASSISTED RESECTION OF MEDIASTINAL MASS (Left) Subjective: Some incisional pain well controlled with Tylenol and toradol, "hands are getting better"  Objective: Vital signs in last 24 hours: Temp:  [97.7 F (36.5 C)-98.5 F (36.9 C)] 98.5 F (36.9 C) (01/21 0758) Pulse Rate:  [79-103] 103 (01/21 0758) Cardiac Rhythm: Normal sinus rhythm (01/21 0700) Resp:  [16-20] 17 (01/21 0758) BP: (141-160)/(78-93) 147/78 (01/21 0758) SpO2:  [91 %-94 %] 92 % (01/21 0758)  Hemodynamic parameters for last 24 hours:    Intake/Output from previous day: 01/20 0701 - 01/21 0700 In: 960 [P.O.:960] Out: -  Intake/Output this shift: Total I/O In: 240 [P.O.:240] Out: -   General appearance: alert, cooperative, and no distress Neurologic: intact Heart: regular rate and rhythm Lungs: diminished breath sounds right base Wound: some bruising around incisions  Lab Results: Recent Labs    07/04/21 0059 07/05/21 0104  WBC 14.2* 13.2*  HGB 13.8 13.0  HCT 41.8 39.1  PLT 300 270   BMET:  Recent Labs    07/04/21 0059 07/05/21 0104  NA 135 135  K 4.6 4.0  CL 102 103  CO2 23 25  GLUCOSE 221* 191*  BUN 17 16  CREATININE 1.15 0.85  CALCIUM 9.1 8.8*    PT/INR: No results for input(s): LABPROT, INR in the last 72 hours. ABG    Component Value Date/Time   PHART 7.399 07/03/2021 1030   HCO3 22.6 07/03/2021 1030   ACIDBASEDEF 1.5 07/03/2021 1030   O2SAT 97.5 07/03/2021 1030   CBG (last 3)  Recent Labs    07/03/21 1021 07/03/21 1613  GLUCAP 126* 180*    Assessment/Plan: S/P Procedure(s) (LRB): XI ROBOTIC ASSISTED RESECTION OF MEDIASTINAL MASS (Left) POD # 2 resection of mediastinal mass Path pending Pain well controlled Doing well Will dc home today   LOS: 2 days    [name] 07/05/2021

## 2021-07-07 ENCOUNTER — Other Ambulatory Visit (HOSPITAL_COMMUNITY): Payer: Self-pay

## 2021-07-07 ENCOUNTER — Telehealth: Payer: Self-pay | Admitting: General Practice

## 2021-07-07 ENCOUNTER — Encounter: Payer: Self-pay | Admitting: Thoracic Surgery (Cardiothoracic Vascular Surgery)

## 2021-07-07 ENCOUNTER — Telehealth: Payer: Self-pay

## 2021-07-07 LAB — SURGICAL PATHOLOGY

## 2021-07-07 NOTE — Telephone Encounter (Signed)
Transition Care Management Follow-up Telephone Call Date of discharge and from where: 07/05/21 from [facility] How have you been since you were released from the hospital?  Any questions or concerns? No  Items Reviewed: Did the pt receive and understand the discharge instructions provided? Yes  Medications obtained and verified? No  Other? No  Any new allergies since your discharge? No  Dietary orders reviewed? Yes Do you have support at home? Yes   Home Care and Equipment/Supplies: Were home health services ordered? no  Functional Questionnaire: (I = Independent and D = Dependent) ADLs: I  Bathing/Dressing- I  Meal Prep- I  Eating- I  Maintaining continence- I  Transferring/Ambulation- I  Managing Meds- I  Follow up appointments reviewed:  PCP Hospital f/u appt confirmed? No   Specialist Hospital f/u appt confirmed? Yes  Scheduled to see the Dr. Gorden Harms on 07/24/21. Are transportation arrangements needed? No  If their condition worsens, is the pt aware to call PCP or go to the Emergency Dept.? Yes Was the patient provided with contact information for the PCP's office or ED? Yes Was to pt encouraged to call back with questions or concerns? Yes

## 2021-07-07 NOTE — Telephone Encounter (Signed)
Patient contacted the office through [place] concerned about his incision on his left chest. He is s/p VATS/mediastinal mass resection 07/03/21 and was discharged from the hospital on 1/21. He states that it is hard around the incision sites and has a reddened area that is somewhat painful, but mainly just pressure at the site. Advised patient to send a picture to look at the incision.   Upon looking at the incision and spoke with Ellwood Handler, PA, incision does not look infected but may have some swelling, possible hematoma at the site. Per Junie Panning, patient is to come into the office on Friday, 07/11/21 to have his sutures removed and to look at the site. Advised that if in the meantime patient displayed any sign/symptoms of infection he is to contact the office back for further evaluation. He acknowledged receipt. Cancelled appointment for suture removal for 2/2 as it has been rescheduled.

## 2021-07-09 DIAGNOSIS — R739 Hyperglycemia, unspecified: Secondary | ICD-10-CM | POA: Insufficient documentation

## 2021-07-11 ENCOUNTER — Ambulatory Visit (INDEPENDENT_AMBULATORY_CARE_PROVIDER_SITE_OTHER): Payer: Self-pay

## 2021-07-11 ENCOUNTER — Other Ambulatory Visit: Payer: Self-pay

## 2021-07-11 ENCOUNTER — Telehealth: Payer: Self-pay

## 2021-07-11 VITALS — BP 116/81 | HR 100 | Resp 20

## 2021-07-11 DIAGNOSIS — Z4802 Encounter for removal of sutures: Secondary | ICD-10-CM

## 2021-07-11 NOTE — Telephone Encounter (Signed)
STD form completed and faxed to [facility] [employer] @ [phone]. LOA begins 07/03/21 through 09/01/21.

## 2021-07-11 NOTE — Progress Notes (Signed)
Removed 1 suture from chest tube incision site with no signs of infection and pt tolerated well.

## 2021-07-16 ENCOUNTER — Ambulatory Visit: Payer: No Typology Code available for payment source | Admitting: Cardiology

## 2021-07-16 ENCOUNTER — Other Ambulatory Visit: Payer: Self-pay

## 2021-07-16 ENCOUNTER — Encounter: Payer: Self-pay | Admitting: Cardiology

## 2021-07-16 VITALS — BP 122/80 | HR 90 | Ht 72.0 in | Wt 283.0 lb

## 2021-07-16 DIAGNOSIS — R0789 Other chest pain: Secondary | ICD-10-CM | POA: Diagnosis not present

## 2021-07-16 DIAGNOSIS — I2510 Atherosclerotic heart disease of native coronary artery without angina pectoris: Secondary | ICD-10-CM

## 2021-07-16 DIAGNOSIS — G4733 Obstructive sleep apnea (adult) (pediatric): Secondary | ICD-10-CM | POA: Diagnosis not present

## 2021-07-16 DIAGNOSIS — R002 Palpitations: Secondary | ICD-10-CM

## 2021-07-16 DIAGNOSIS — I318 Other specified diseases of pericardium: Secondary | ICD-10-CM

## 2021-07-16 NOTE — Patient Instructions (Signed)
Medication Instructions:  Your physician recommends that you continue on your current medications as directed. Please refer to the Current Medication list given to you today.  *If you need a refill on your cardiac medications before your next appointment, please call your pharmacy*   Lab Work: Labs in 1 month: Fasting Lipids If you have labs (blood work) drawn today and your tests are completely normal, you will receive your results only by: [place] (if you have MyChart) OR A paper copy in the mail If you have any lab test that is abnormal or we need to change your treatment, we will call you to review the results.   Testing/Procedures: None   Follow-Up: At [facility], you and your health needs are our priority.  As part of our continuing mission to provide you with exceptional heart care, we have created designated Provider Care Teams.  These Care Teams include your primary Cardiologist (physician) and Advanced Practice Providers (APPs -  Physician Assistants and Nurse Practitioners) who all work together to provide you with the care you need, when you need it.  We recommend signing up for the patient portal called "MyChart".  Sign up information is provided on this After Visit Summary.  MyChart is used to connect with patients for Virtual Visits (Telemedicine).  Patients are able to view lab/test results, encounter notes, upcoming appointments, etc.  Non-urgent messages can be sent to your provider as well.   To learn more about what you can do with MyChart, go to [email or web address].    Your next appointment:   6 month(s)  The format for your next appointment:   In Person  Provider:   Jenne Campus, MD    Other Instructions None

## 2021-07-16 NOTE — Addendum Note (Signed)
Addended by: Edwyna Shell I on: 07/16/2021 09:38 AM   Modules accepted: Orders

## 2021-07-16 NOTE — Progress Notes (Signed)
Cardiology Office Note:    Date:  07/16/2021   ID:  Joshua Brady, DOB 1976-05-12, MRN [id number]  PCP:  Luetta Nutting, DO  Cardiologist:  Jenne Campus, MD    Referring MD: Emeterio Reeve, DO   No chief complaint on file. 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 male with past medical history significant for coronary artery disease.  He did have a cardiac catheterization few years ago which showed only 20% proximal LAD, dyslipidemia, palpitation, obstructive sleep apnea.  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  Chest pain that he had is gone actually he tells me that the moment he woke up after surgery he is already felt better he is very enthusiastic and happy with the care that he got a month is gone.  He does not exercise on the regular basis now because he is still recovering from surgery but is eager to go back to his exercises.  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   Low testosterone 04/29/2018   Lumbar degenerative disc disease 02/06/2020   Palpitations 03/03/2018   Sleep apnea 03/11/2018   SOB (shortness of breath) on exertion 03/03/2018   Viral illness 09/11/2019    Past Surgical History:  Procedure Laterality Date   Broken Arm     LEFT HEART CATH AND CORONARY ANGIOGRAPHY N/A 11/22/2020   Procedure: LEFT HEART CATH AND CORONARY ANGIOGRAPHY;  Surgeon: Troy Sine, MD;  Location: [facility];  Service: Cardiovascular;  Laterality: N/A;   LIVER BIOPSY      Current Medications: Current Meds  Medication Sig   aspirin EC 81 MG tablet Take 1 tablet (81 mg total) by mouth daily. Swallow whole.   atorvastatin (LIPITOR) 40 MG tablet Take 1 tablet (40 mg total) by mouth daily.   carvedilol (COREG) 6.25 MG tablet Take 1 tablet (6.25 mg total) by mouth 2 (two) times daily with a meal.   clobetasol cream (TEMOVATE) 9.73 % Apply 1 application on to the skin 2 (two) times daily.   Coenzyme Q10 (CO Q-10 PO) Take 1 tablet by mouth daily at 12 noon. Unknown strenght   fluticasone (FLONASE) 50 MCG/ACT nasal spray Place 2 sprays into each nostril once daily   OVER THE COUNTER MEDICATION Take 1 tablet by mouth daily at 12 noon. Beet root/Unknown strength     Allergies:   Other, Prednisone, and Topiramate   Social History   Socioeconomic History   Marital status: Married    Spouse name: Garek Schuneman   Number of children: 2   Years of education: Not on file   Highest education level: Not on file  Occupational History   Not on file  Tobacco Use   Smoking status: Never    Passive exposure: Never   Smokeless tobacco: Never  Vaping Use   Vaping Use: Never used  Substance and Sexual Activity   Alcohol use: Yes    Comment: social   Drug use: No   Sexual activity: Yes    Birth control/protection: None  Other Topics Concern   Not on file  Social History Narrative   Not on file   Social Determinants of Health   Financial Resource Strain: Not on file  Food Insecurity: Not on file  Transportation Needs: Not on file  Physical Activity: Not on file  Stress: Not on file  Social Connections: Not on file     Family History: The patient's family history includes Alcohol abuse in his father; Myelodysplastic syndrome in his father. There is no history of Other. ROS:   Please see the history of present illness.    All 14 point review of systems negative except as described per  history of present illness  EKGs/Labs/Other Studies Reviewed:      Recent Labs: 06/15/2021: B Natriuretic Peptide 10.2; Magnesium 1.8 07/05/2021: ALT 49; BUN 16; Creatinine, Ser 0.85; Hemoglobin 13.0; Platelets 270; Potassium 4.0; Sodium 135  Recent Lipid Panel    Component Value Date/Time   CHOL 170 01/02/2021 0844   TRIG 163 (H) 01/02/2021 0844   HDL 36 (L) 01/02/2021 0844   CHOLHDL 4.7 01/02/2021 0844   CHOLHDL 6.8 (H) 03/03/2018 0914   VLDL 45 (H) 01/21/2017 0859   LDLCALC 105 (H) 01/02/2021 0844   LDLCALC 192 (H) 03/03/2018 0914    Physical Exam:    VS:  BP 122/80 (BP Location: Left Arm)    Pulse 90    Ht 6' (1.829 m)    Wt 283 lb (128.4 kg)    SpO2 96%    BMI 38.38 kg/m     Wt Readings from Last 3 Encounters:  07/16/21 283 lb (128.4 kg)  07/03/21 289 lb 11 oz (131.4 kg)  07/01/21 289 lb 4.8 oz (131.2 kg)     GEN:  Well nourished, well developed in no acute distress HEENT: Normal NECK: No JVD; No carotid bruits LYMPHATICS: No lymphadenopathy CARDIAC: RRR, no murmurs, no rubs, no gallops RESPIRATORY: Poor air entry left base otherwise normal he does have difficulty taking breath because of pain still ABDOMEN: Soft, non-tender, non-distended MUSCULOSKELETAL:  No edema; No deformity  SKIN: Warm and dry LOWER EXTREMITIES: no swelling NEUROLOGIC:  Alert and oriented x 3 PSYCHIATRIC:  Normal affect   ASSESSMENT:    1. Coronary artery disease involving native coronary artery of native heart without angina pectoris   2. Pericardial mass   3. Obstructive sleep apnea syndrome   4. Atypical chest pain   5. Palpitations    PLAN:    In order of problems listed above:  Coronary artery disease he does have 20% proximal LAD.  He is on high intensity statin I will ask him to have fasting lipid profile done but however will wait for about 2 months before rechecking it after surgery. Pericardial mass s/p surgery he is doing excellently after that the is very happy with the  results of the surgery. Obstructive sleep apnea: That being followed by internal medicine team Chest pain is gone after surgery Palpitations denies having any I did review record from hospitalization for this visit Incision after surgery are healing well   Medication Adjustments/Labs and Tests Ordered: Current medicines are reviewed at length with the patient today.  Concerns regarding medicines are outlined above.  No orders of the defined types were placed in this encounter.  Medication changes: No orders of the defined  types were placed in this encounter.   Signed, Park Liter, MD, [facility] 07/16/2021 9:30 AM    [place]

## 2021-07-25 ENCOUNTER — Other Ambulatory Visit: Payer: Self-pay | Admitting: Physician Assistant

## 2021-07-25 ENCOUNTER — Other Ambulatory Visit (HOSPITAL_BASED_OUTPATIENT_CLINIC_OR_DEPARTMENT_OTHER): Payer: Self-pay

## 2021-07-25 MED ORDER — ATORVASTATIN CALCIUM 40 MG PO TABS
40.0000 mg | ORAL_TABLET | Freq: Every day | ORAL | 6 refills | Status: DC
  Filled 2021-07-25: qty 30, 30d supply, fill #0
  Filled 2021-09-01: qty 30, 30d supply, fill #1
  Filled 2021-10-03: qty 30, 30d supply, fill #2
  Filled 2021-11-03: qty 30, 30d supply, fill #3
  Filled 2021-12-04 – 2021-12-17 (×2): qty 30, 30d supply, fill #4
  Filled 2022-01-15: qty 30, 30d supply, fill #5

## 2021-07-29 ENCOUNTER — Other Ambulatory Visit: Payer: Self-pay

## 2021-07-29 ENCOUNTER — Ambulatory Visit (INDEPENDENT_AMBULATORY_CARE_PROVIDER_SITE_OTHER): Payer: Self-pay | Admitting: Thoracic Surgery (Cardiothoracic Vascular Surgery)

## 2021-07-29 ENCOUNTER — Encounter: Payer: Self-pay | Admitting: Thoracic Surgery (Cardiothoracic Vascular Surgery)

## 2021-07-29 ENCOUNTER — Other Ambulatory Visit: Payer: Self-pay | Admitting: *Deleted

## 2021-07-29 ENCOUNTER — Ambulatory Visit
Admission: RE | Admit: 2021-07-29 | Discharge: 2021-07-29 | Disposition: A | Discharge status: Home / Self Care | Payer: No Typology Code available for payment source | Source: Ambulatory Visit | Attending: Thoracic Surgery (Cardiothoracic Vascular Surgery) | Admitting: Thoracic Surgery (Cardiothoracic Vascular Surgery)

## 2021-07-29 VITALS — BP 145/97 | HR 89 | Resp 20 | Ht 72.0 in | Wt 281.0 lb

## 2021-07-29 DIAGNOSIS — J9859 Other diseases of mediastinum, not elsewhere classified: Secondary | ICD-10-CM

## 2021-07-29 DIAGNOSIS — Z09 Encounter for follow-up examination after completed treatment for conditions other than malignant neoplasm: Secondary | ICD-10-CM

## 2021-07-29 IMAGING — CR DG CHEST 2V
2 series · 2 of 2 positions shown · non-contrast
Comparison: [DATE]

CLINICAL DATA: Mediastinal mass postop resection.

EXAM:
CHEST - 2 VIEW

[w chest pa]
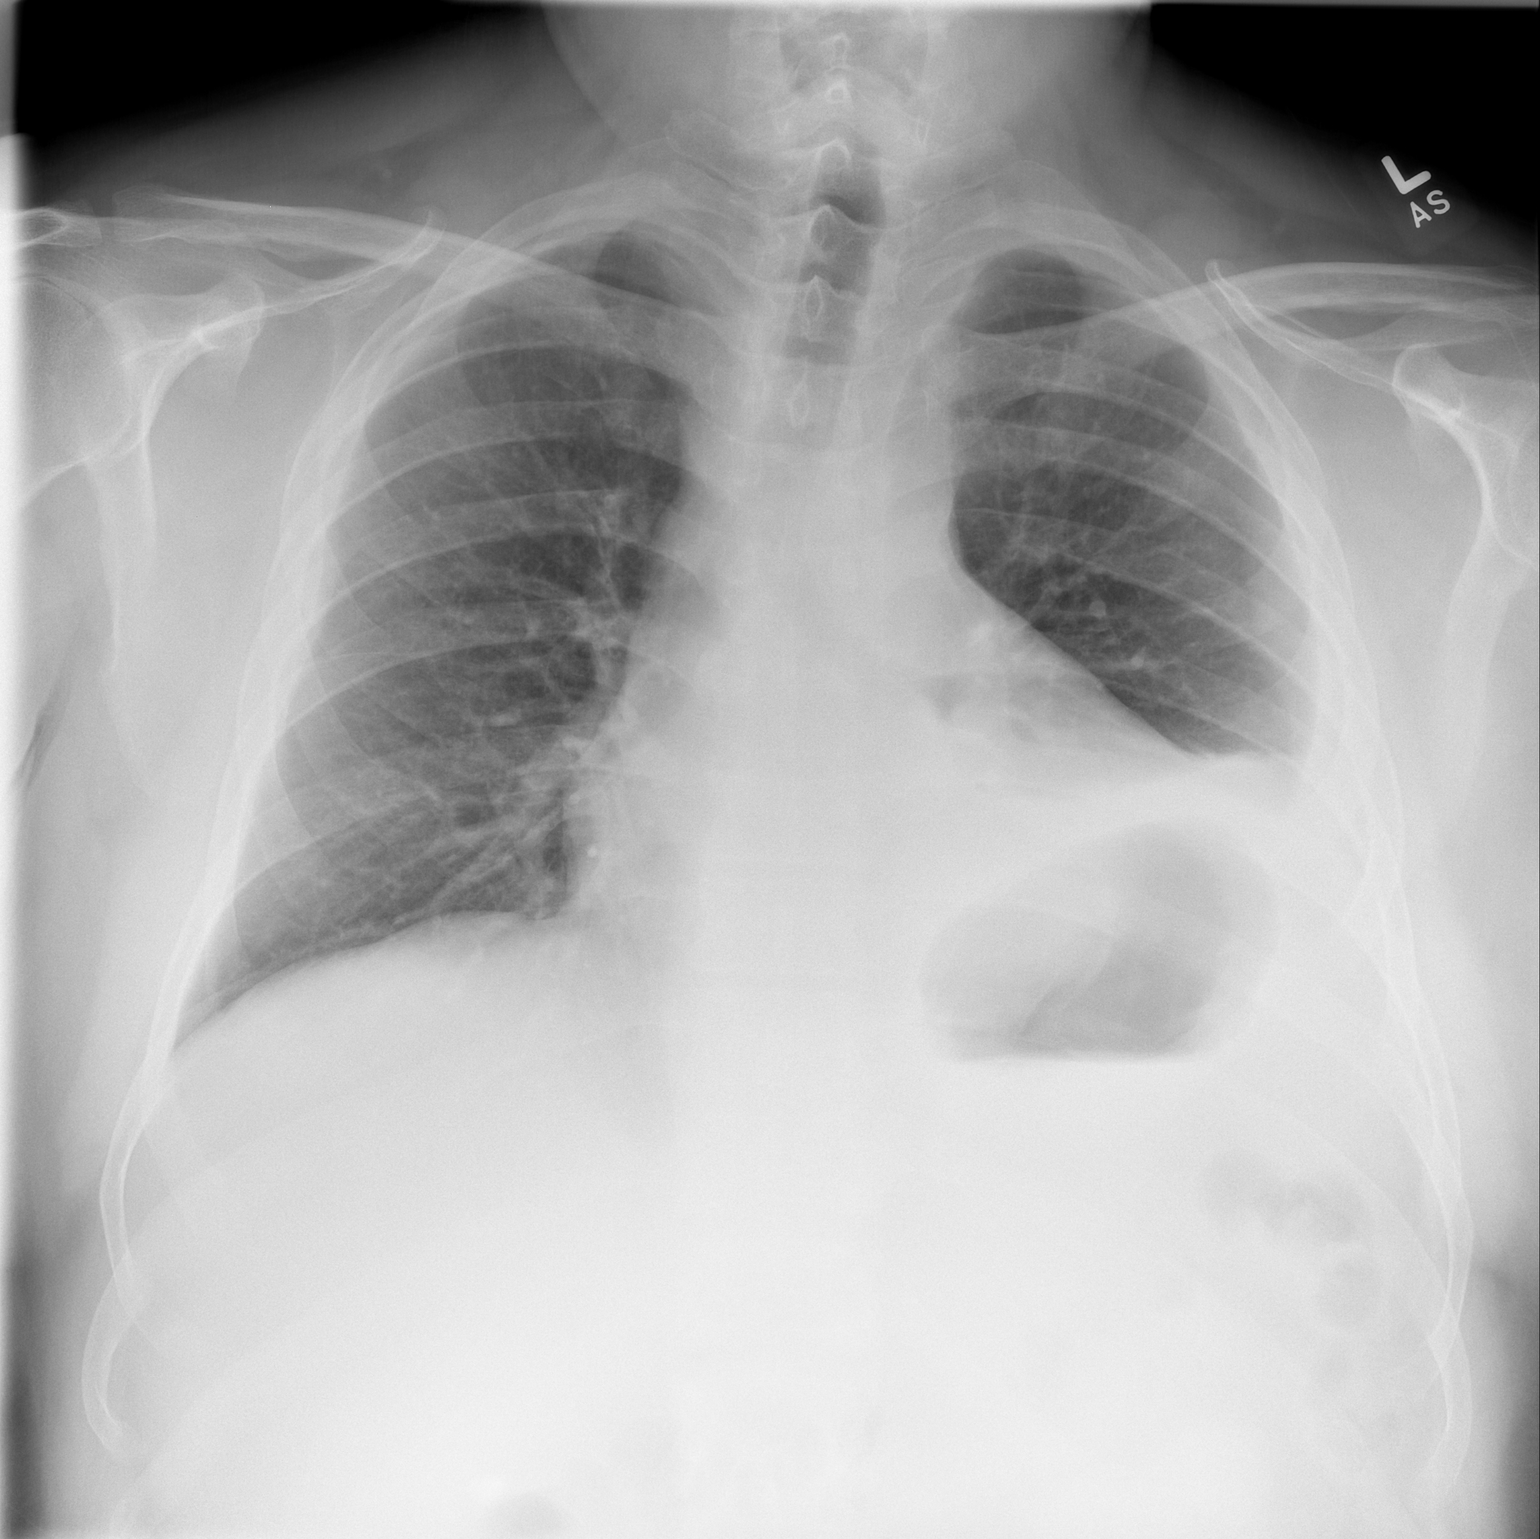

[w chest lat *]
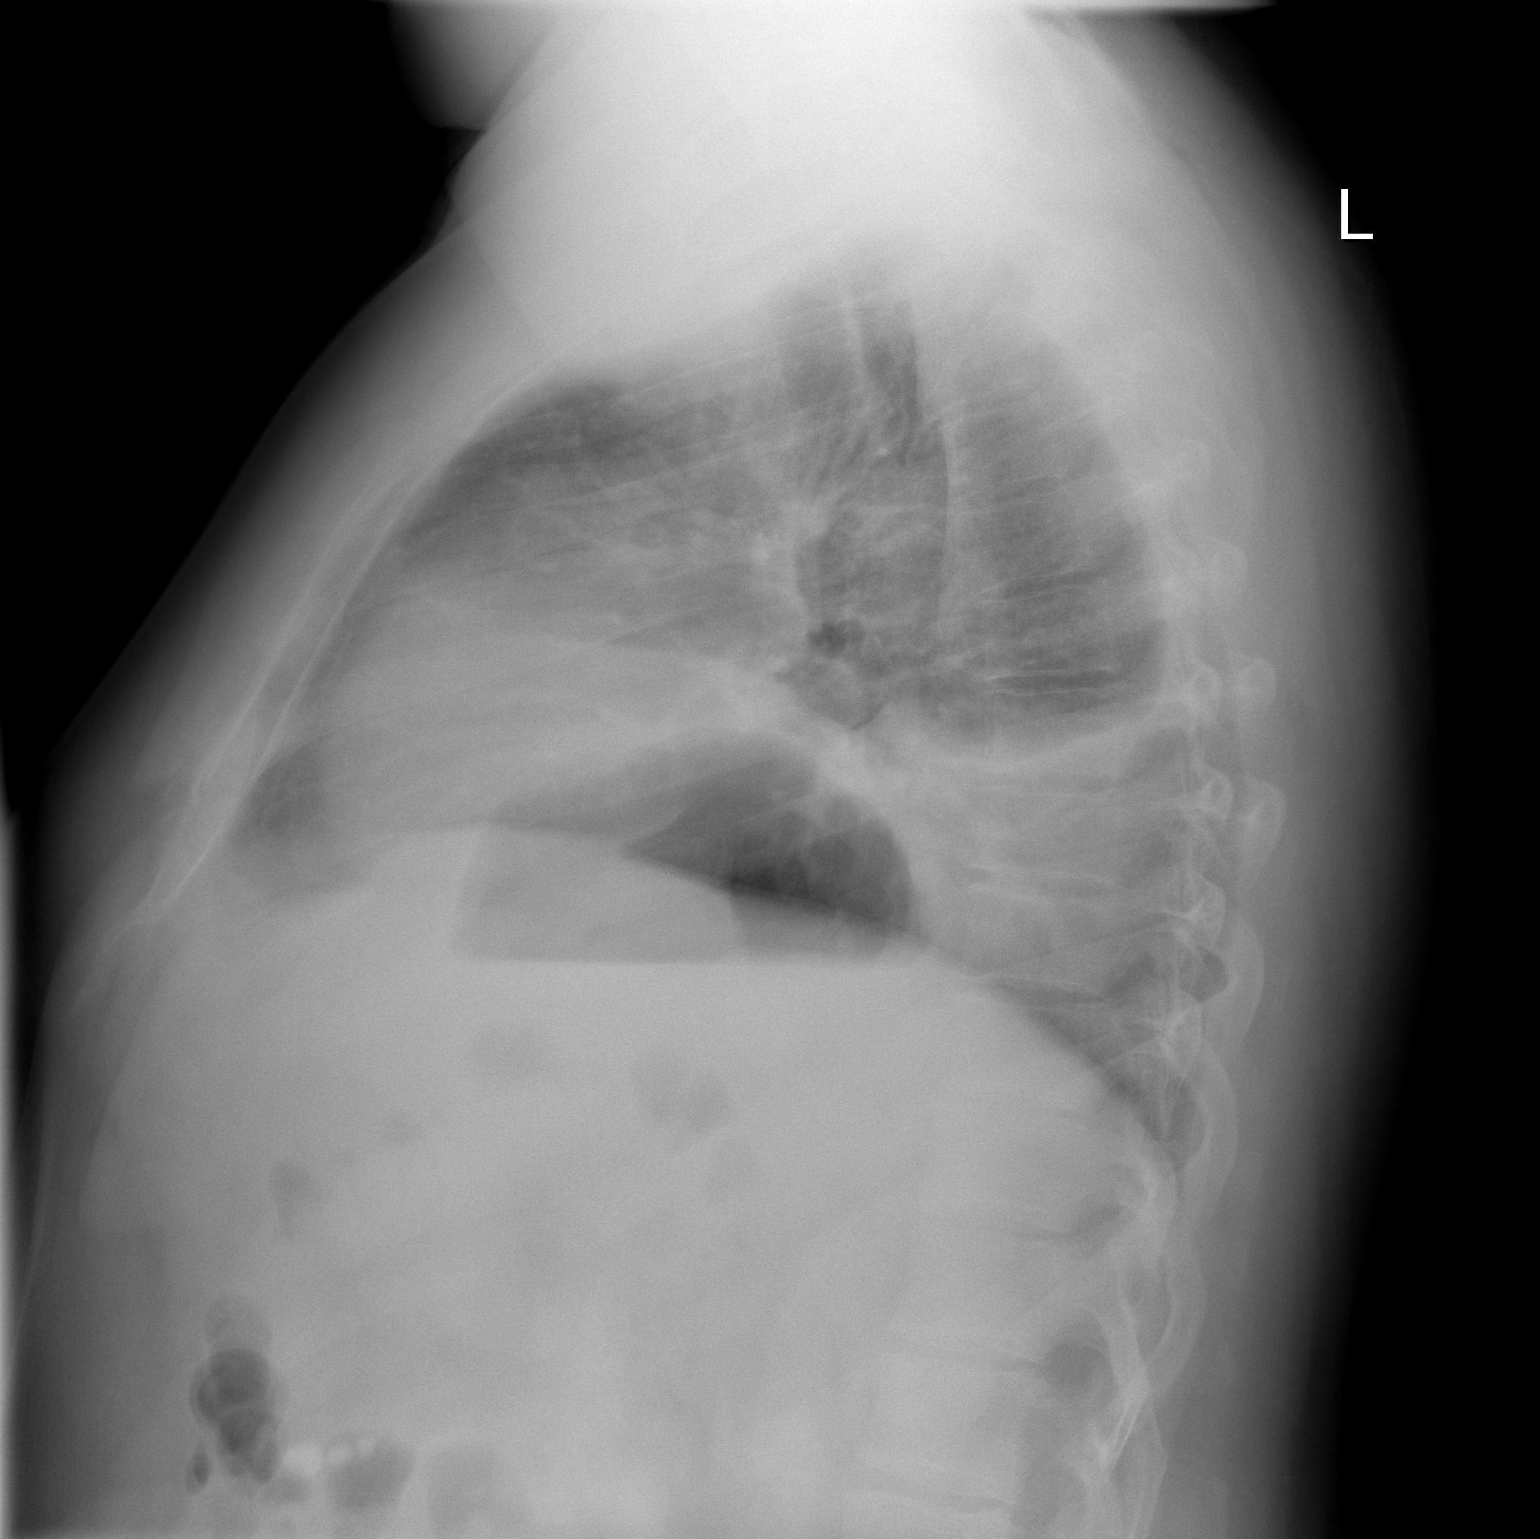

[2 of 2 positions shown; findings below may reference images not displayed]

FINDINGS: Small left pleural effusion with progression. Left lower lobe
atelectasis with progression. No pneumothorax on the left.

Right lung remains clear
IMPRESSION: Mild progression of small left effusion and left lower lobe
atelectasis. No pneumothorax.

## 2021-07-29 NOTE — Progress Notes (Signed)
[address]       [place] [address]             [phone]     HPI: [name] returns for scheduled follow-up visit  Joshua Brady is a 46 year old man with history of CAD, dyspnea, hypertension, hyperlipidemia, obesity, obstructive sleep apnea, low testosterone, heart murmur, and closed head injury.  He had about 2-year history of atypical chest pain.  Ultimately he was found to have a cystic mediastinal mass.  I did a robotic resection on 07/03/2021.  The cystic mass was wrapped around the phrenic nerve.  It was dissected off that the nerve was preserved.  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  Interestingly his pain resolved immediately.  He does have some incisional pain and also some paresthesias in the parasternal area.  He is not taking any medication for that.  His primary complaint is that he gets short of breath with ambulation.  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 Swallow whole. 90 tablet 3   atorvastatin (LIPITOR) 40 MG tablet Take 1 tablet (40 mg total) by mouth daily. 30 tablet 6   carvedilol (COREG) 6.25 MG tablet Take 1 tablet (6.25 mg total) by mouth 2 (two) times  daily with a meal. 60 tablet 6   clobetasol cream (TEMOVATE) 1.51 % Apply 1 application on to the skin 2 (two) times daily. 30 g 1   Coenzyme Q10 (CO Q-10 PO) Take 1 tablet by mouth daily at 12 noon. Unknown strenght     fluticasone (FLONASE) 50 MCG/ACT nasal spray Place 2 sprays into each nostril once daily 16 g 6   OVER THE COUNTER MEDICATION Take 1 tablet by mouth daily at 12 noon. Beet root/Unknown strength     No current facility-administered medications for this visit.    Physical Exam BP (!) 145/97 (BP Location: Left Arm, Patient Position: Sitting)    Pulse 89    Resp 20    Ht 6' (1.829 m)    Wt 281 lb (127.5 kg)    SpO2 92% Comment: RA   BMI 38.40 kg/m  46 year old man in no acute distress Alert and oriented x3 with no focal deficits Cardiac regular rate and rhythm with normal S1 and S2, 2/6 systolic murmur Lungs diminished at left base but otherwise clear Incisions healing well   Diagnostic Tests: CHEST - 2 VIEW   COMPARISON:  07/04/2021   FINDINGS: Small left pleural effusion with progression. Left lower lobe atelectasis with progression. No pneumothorax on the left.   Right lung remains clear   IMPRESSION: Mild progression of small left effusion  and left lower lobe atelectasis. No pneumothorax.     Electronically Signed   By: Franchot Gallo M.D.   On: 07/29/2021 12:40 I personally reviewed the chest x-ray images.  There is elevation of the left hemidiaphragm.  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  He had a robotic assisted resection just under a month ago.  His postoperative course was uncomplicated.  Overall he is doing well.  He does have some pain but that is improving.  He also has some paresthesias in the left parasternal area.  He is not having to take any pain medication.  His primary issue is shortness of breath.  His chest x-ray does show some elevation of  the left hemidiaphragm.  This tumor did have to be peeled off the phrenic nerve.  Typically in this sort of situation the phrenic nerve will recover firing and the diaphragm will start functioning again.  However, it often takes 6 to 9 months for that to happen.  He works as a Technical brewer.  He is not ready to return to work yet.  Plan: Return in 1 month with chest x-ray.  I do not expect to see any changes that soon.  We will discuss plans for return to work at that point.  Joshua Nakayama, MD [facility] [phone]

## 2021-08-25 ENCOUNTER — Other Ambulatory Visit: Payer: Self-pay | Admitting: Thoracic Surgery (Cardiothoracic Vascular Surgery)

## 2021-08-25 DIAGNOSIS — J9859 Other diseases of mediastinum, not elsewhere classified: Secondary | ICD-10-CM

## 2021-08-26 ENCOUNTER — Ambulatory Visit (INDEPENDENT_AMBULATORY_CARE_PROVIDER_SITE_OTHER): Payer: Self-pay | Admitting: Thoracic Surgery (Cardiothoracic Vascular Surgery)

## 2021-08-26 ENCOUNTER — Ambulatory Visit: Payer: No Typology Code available for payment source | Admitting: Thoracic Surgery (Cardiothoracic Vascular Surgery)

## 2021-08-26 ENCOUNTER — Ambulatory Visit
Admission: RE | Admit: 2021-08-26 | Discharge: 2021-08-26 | Disposition: A | Discharge status: Home / Self Care | Payer: No Typology Code available for payment source | Source: Ambulatory Visit | Attending: Thoracic Surgery (Cardiothoracic Vascular Surgery) | Admitting: Thoracic Surgery (Cardiothoracic Vascular Surgery)

## 2021-08-26 ENCOUNTER — Other Ambulatory Visit (HOSPITAL_BASED_OUTPATIENT_CLINIC_OR_DEPARTMENT_OTHER): Payer: Self-pay

## 2021-08-26 ENCOUNTER — Other Ambulatory Visit: Payer: Self-pay

## 2021-08-26 VITALS — BP 144/87 | HR 104 | Resp 20 | Ht 72.0 in | Wt 283.0 lb

## 2021-08-26 DIAGNOSIS — J9859 Other diseases of mediastinum, not elsewhere classified: Secondary | ICD-10-CM

## 2021-08-26 DIAGNOSIS — Z09 Encounter for follow-up examination after completed treatment for conditions other than malignant neoplasm: Secondary | ICD-10-CM

## 2021-08-26 IMAGING — DX DG CHEST 2V
2 series · 2 of 2 positions shown · non-contrast
Comparison: Multiple previous chest x-rays and CT scan [DATE]

CLINICAL DATA: History of mediastinal mass

EXAM:
CHEST - 2 VIEW

[dg chest 2 view (1 of 2)]
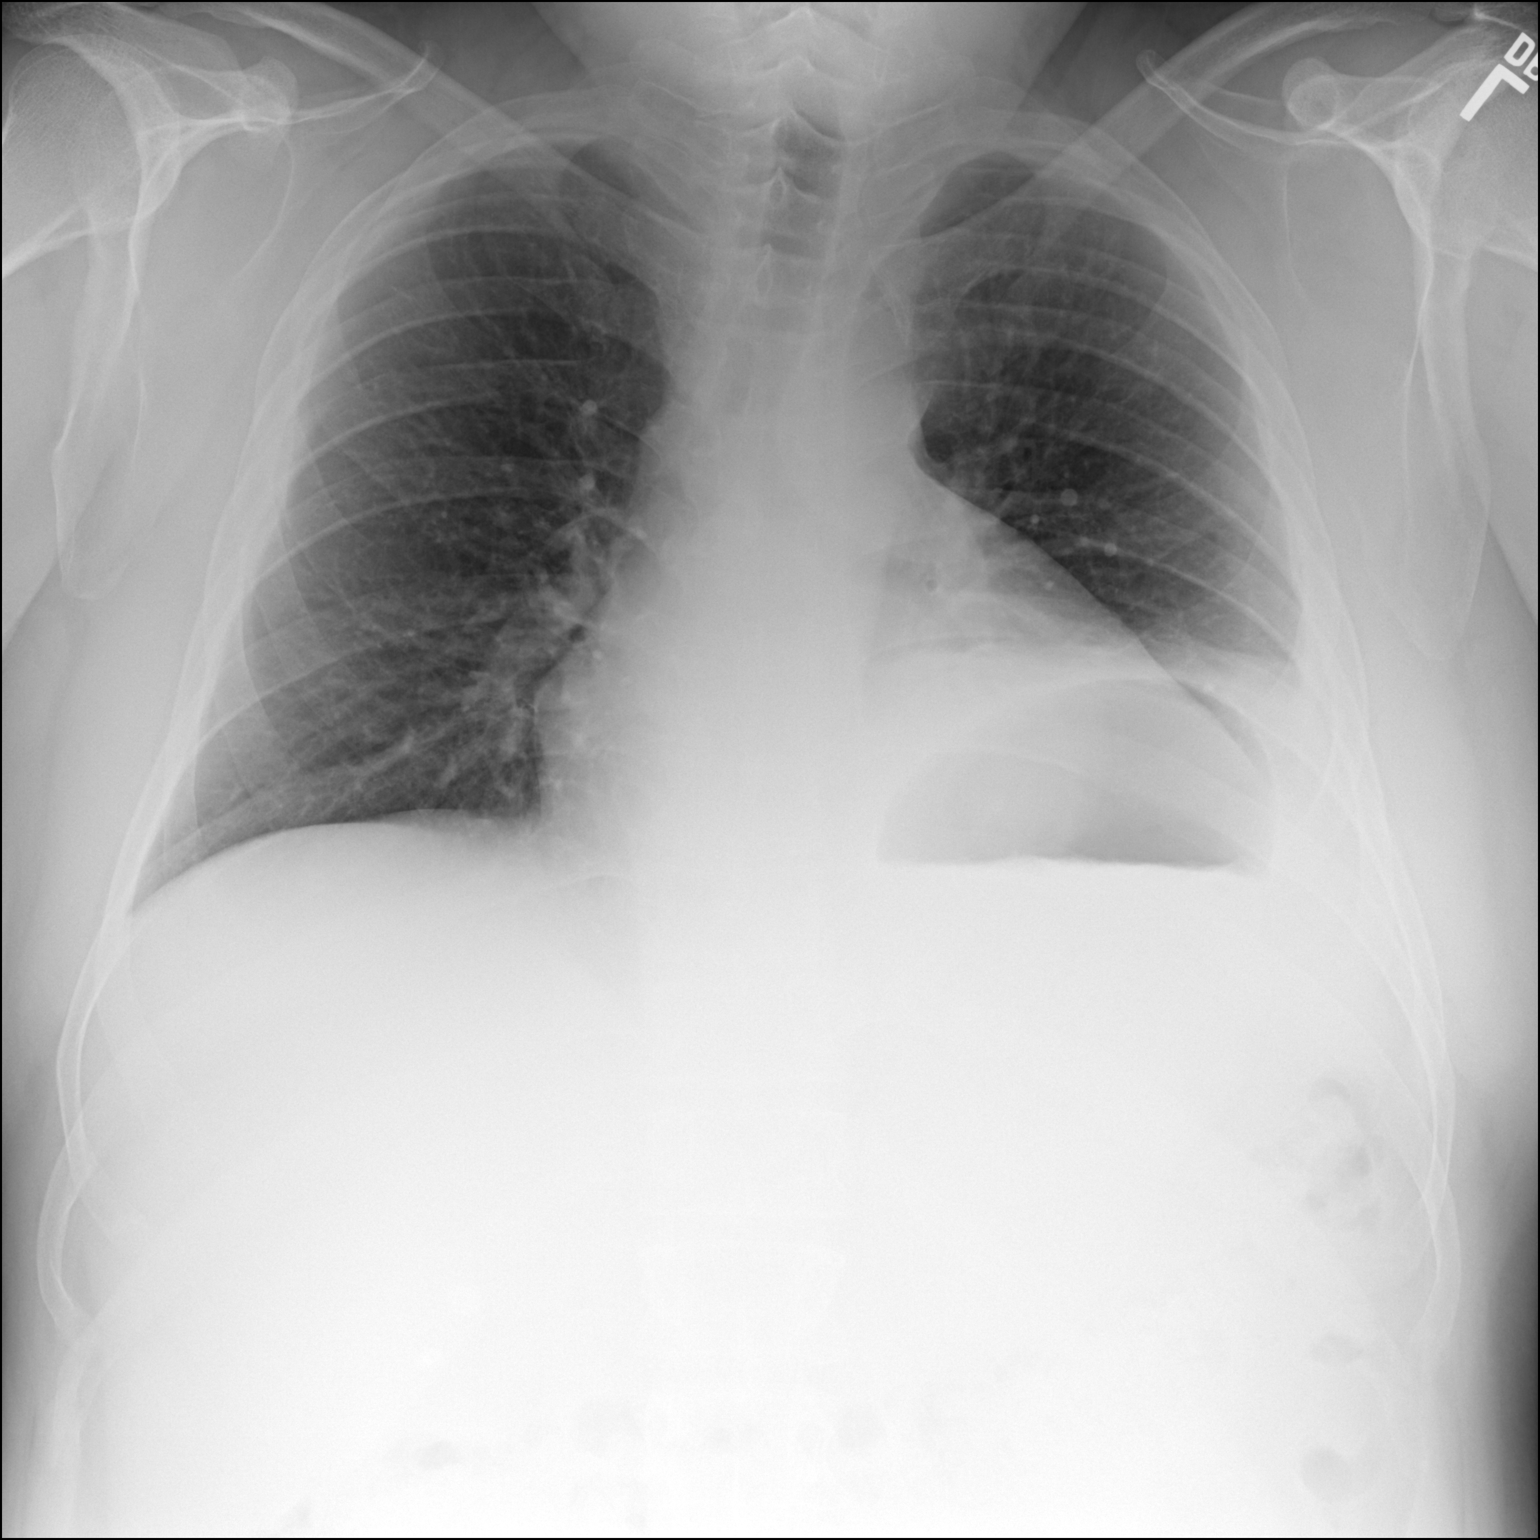

[dg chest 2 view (2 of 2)]
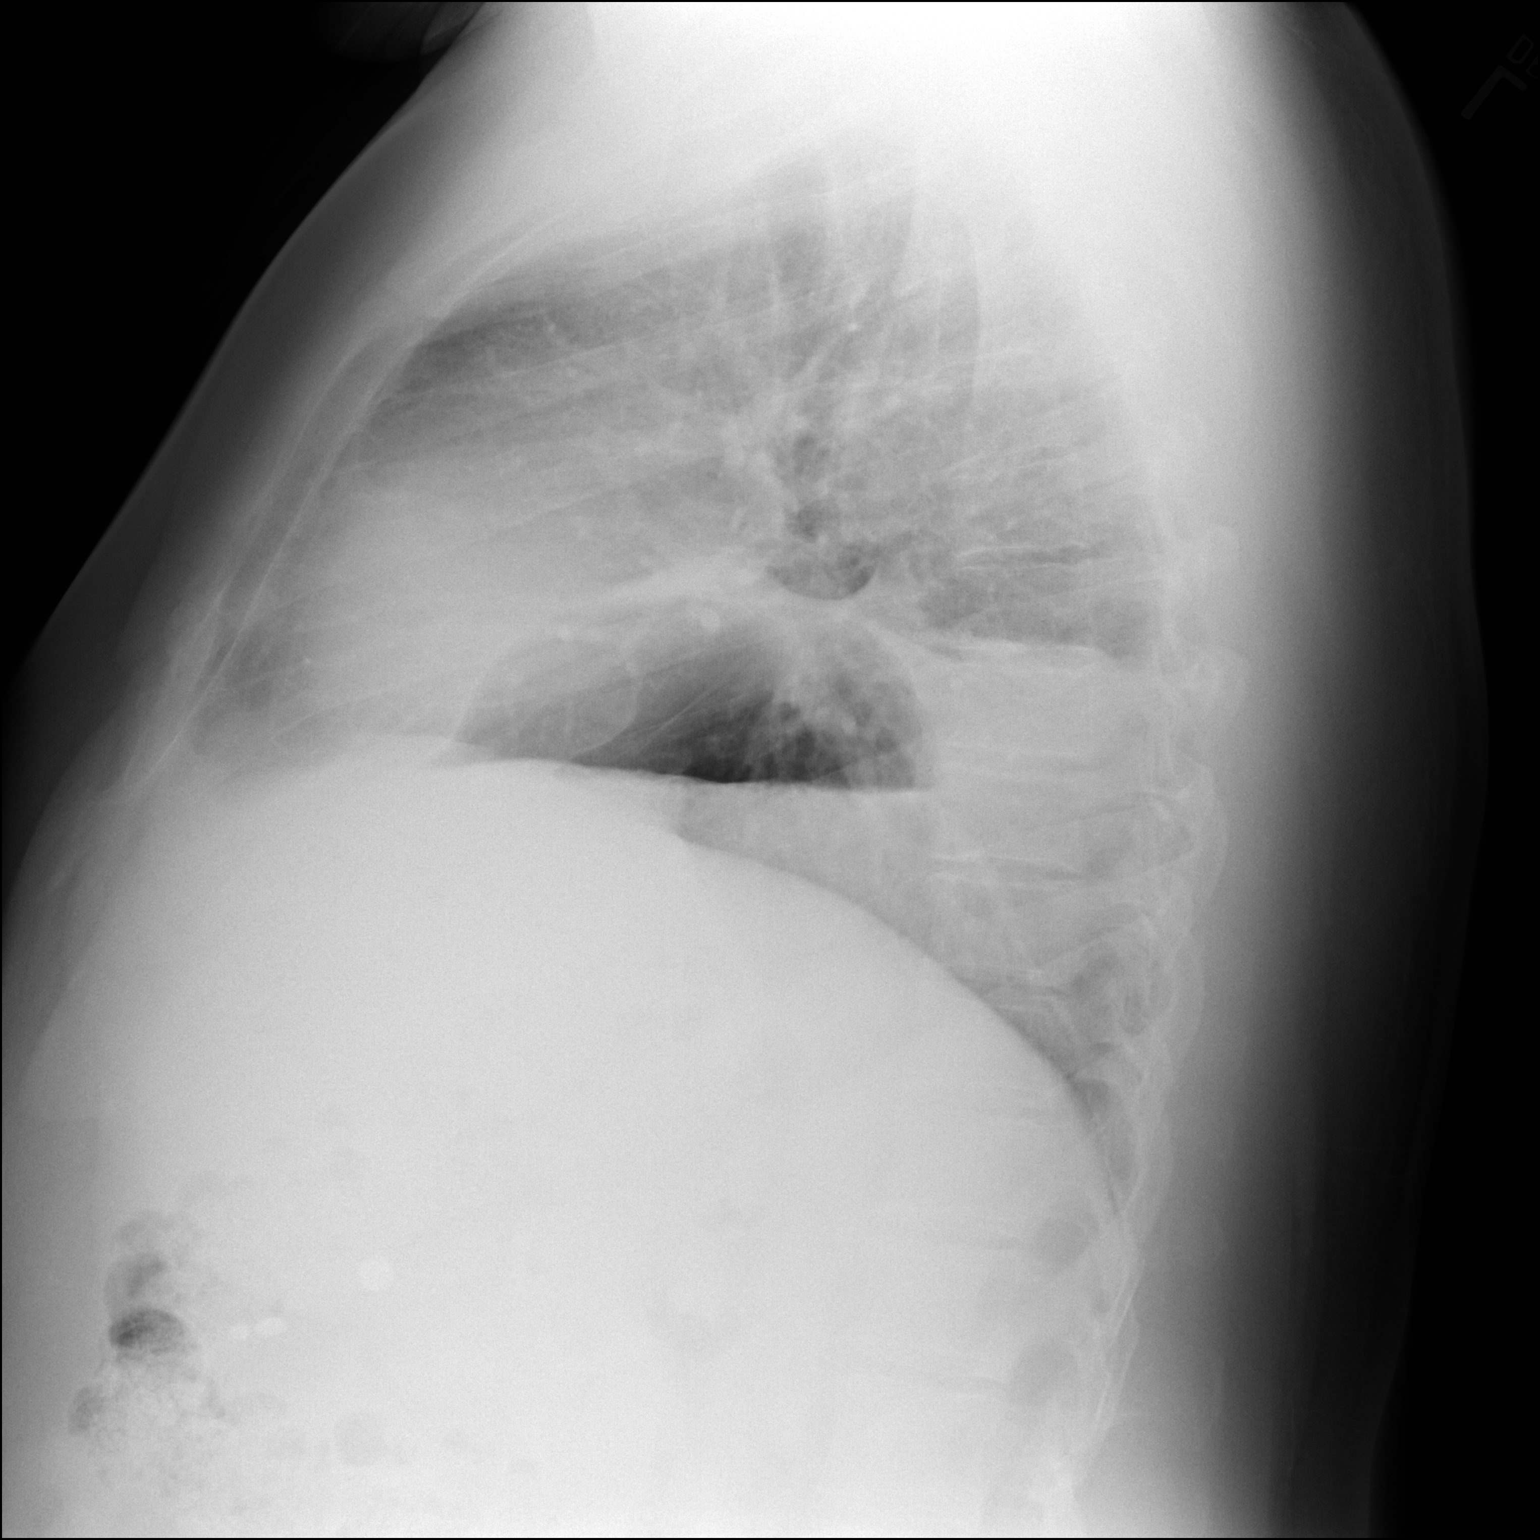

[2 of 2 positions shown; findings below may reference images not displayed]

FINDINGS: The cardiac silhouette, mediastinal and hilar contours are within
normal limits and stable. Stable elevation of the left hemidiaphragm
with some overlying vascular crowding and atelectasis. Persistent
left pleural effusion.

The bony thorax is intact.
IMPRESSION: Persistent elevation of the left hemidiaphragm with left pleural
effusion and overlying vascular crowding and atelectasis.

## 2021-08-26 MED ORDER — PANTOPRAZOLE SODIUM 40 MG PO TBEC
40.0000 mg | DELAYED_RELEASE_TABLET | Freq: Every day | ORAL | 3 refills | Status: DC
  Filled 2021-08-26: qty 30, 30d supply, fill #0

## 2021-08-26 NOTE — Progress Notes (Signed)
? ?   ?[address] ?      [place] [address] ?            [phone]   ? ?  ?HPI: [name] returns for a scheduled postoperative follow-up visit after his recent surgery. ? ?Joshua Brady is a 46 year old with a history of CAD, dyspnea, hypertension, hyperlipidemia, obesity, obstructive sleep apnea, heart murmur, closed head injury, and a cystic mediastinal mass. ? ?He presented with a 2-year history of atypical chest pain and was ultimately found to have a cystic mediastinal mass.  That was resected robotically from the left chest on 07/03/2021.  The mass was wrapped around the phrenic nerve.  The nerve was preserved but the mass was dissected off.  It turned out to be a benign cystic lesion. ? ?His atypical chest pain resolved immediately.  However he has had shortness of breath with an elevated left hemidiaphragm. ? ?Continues to do well from a pain standpoint.  He does still get short of breath with relatively light activity such as walking.  He also complains of worsening reflux since his surgery.  He has been taking Tums for that but is not on any proton pump inhibitor.  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 ? ?Past Medical History:  ?Diagnosis Date  ? Anxiety   ? Borderline high cholesterol 01/25/2017  ? 10-yr ASCVD 4.1%  ? Daytime somnolence 03/11/2018  ? Dyslipidemia (high LDL; low HDL) 01/25/2017  ? 10-yr ASCVD risk 4.1% (01/2017)  ? Head injury 2016  ? Heart murmur   ? History of brain concussion 01/21/2017  ? History of low back pain 01/21/2017  ? Hyperglycemia   ? Hypertension   ? Pre-hypertension per patient  ? 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 Low testosterone 04/29/2018  ? Lumbar degenerative disc disease 02/06/2020  ? Palpitations 03/03/2018  ? Sleep apnea 03/11/2018  ? SOB (shortness of breath) on exertion 03/03/2018  ? Viral illness 09/11/2019  ? ? ?Current Outpatient Medications  ?Medication Sig Dispense Refill  ? aspirin EC 81 MG  tablet Take 1 tablet (81 mg total) by mouth daily. Swallow whole. 90 tablet 3  ? atorvastatin (LIPITOR) 40 MG tablet Take 1 tablet (40 mg total) by mouth daily. 30 tablet 6  ? carvedilol (COREG) 6.25 MG tablet Take 1 tablet (6.25 mg total) by mouth 2 (two) times daily with a meal. 60 tablet 6  ? clobetasol cream (TEMOVATE) 5.63 % Apply 1 application on to the skin 2 (two) times daily. 30 g 1  ? Coenzyme Q10 (CO Q-10 PO) Take 1 tablet by mouth daily at 12 noon. Unknown strenght    ? fluticasone (FLONASE) 50 MCG/ACT nasal spray Place 2 sprays into each nostril once daily 16 g 6  ? OVER THE COUNTER MEDICATION Take 1 tablet by mouth daily at 12 noon. Beet root/Unknown strength    ? pantoprazole (PROTONIX) 40 MG tablet Take 1 tablet (40 mg total) by mouth daily. 30 tablet 3  ? ?No current facility-administered medications for this visit.  ? ? ?Physical Exam ?Constitutional:   ?   General: He is not in acute distress. ?   Appearance: He is obese.  ?HENT:  ?   Head: Normocephalic and atraumatic.  ?Eyes:  ?   Extraocular Movements: Extraocular movements intact.  ?Cardiovascular:  ?   Rate and Rhythm: Normal rate and regular rhythm.  ?Pulmonary:  ?   Comments: Decreased breath  sounds at left base. ?Skin: ?   Comments: Incisions well-healed  ?Neurological:  ?   General: No focal deficit present.  ?   Mental Status: He is oriented to person, place, and time.  ? ? ?Diagnostic Tests: ?CHEST - 2 VIEW ?  ?COMPARISON:  Multiple previous chest x-rays and CT scan 04/18/2021 ?  ?FINDINGS: ?The cardiac silhouette, mediastinal and hilar contours are within ?normal limits and stable. Stable elevation of the left hemidiaphragm ?with some overlying vascular crowding and atelectasis. Persistent ?left pleural effusion. ?  ?The bony thorax is intact. ?  ?IMPRESSION: ?Persistent elevation of the left hemidiaphragm with left pleural ?effusion and overlying vascular crowding and atelectasis. ?  ?  ?Electronically Signed ?  By: Marijo Sanes  M.D. ?  On: 08/26/2021 16:19 ?I personally reviewed the CT images.  No change in the elevated left hemidiaphragm ? ?Impression: ?Joshua Brady is a 46 year old with a history of CAD, dyspnea, hypertension, hyperlipidemia, obesity, obstructive sleep apnea, heart murmur, closed head injury, and a cystic mediastinal mass. ? ? ?Elevated left hemidiaphragm-no change on today's x-ray.  Likely will take at least 6 months to improve.  He does understand there is a possibility it may never resolve. ? ?Reflux-we will start Protonix 40 mg daily, can use Tums or over-the-counter antacids as needed ? ?We discussed return to work.  He does not have the stamina to do so currently.  We will set a tentative return to work date of November 13, 2021.  We will reassess him in a couple months to see if that will be possible. ? ?Plan: ? ?Protonix 40 mg daily ?Return in 2 months with PA and lateral chest x-ray ? ?[name], MD ?[facility] ?[phone] ? ? ? ? ?

## 2021-09-01 ENCOUNTER — Other Ambulatory Visit (HOSPITAL_BASED_OUTPATIENT_CLINIC_OR_DEPARTMENT_OTHER): Payer: Self-pay

## 2021-09-01 ENCOUNTER — Other Ambulatory Visit: Payer: Self-pay | Admitting: Physician Assistant

## 2021-09-02 ENCOUNTER — Other Ambulatory Visit (HOSPITAL_BASED_OUTPATIENT_CLINIC_OR_DEPARTMENT_OTHER): Payer: Self-pay

## 2021-09-02 MED ORDER — CARVEDILOL 6.25 MG PO TABS
6.2500 mg | ORAL_TABLET | Freq: Two times a day (BID) | ORAL | 2 refills | Status: DC
  Filled 2021-09-02: qty 180, 90d supply, fill #0
  Filled 2021-12-04 – 2021-12-17 (×2): qty 180, 90d supply, fill #1
  Filled 2022-05-01: qty 180, 90d supply, fill #2

## 2021-09-29 ENCOUNTER — Ambulatory Visit: Payer: No Typology Code available for payment source | Admitting: Family Medicine

## 2021-10-03 ENCOUNTER — Other Ambulatory Visit (HOSPITAL_BASED_OUTPATIENT_CLINIC_OR_DEPARTMENT_OTHER): Payer: Self-pay

## 2021-10-07 ENCOUNTER — Other Ambulatory Visit (HOSPITAL_BASED_OUTPATIENT_CLINIC_OR_DEPARTMENT_OTHER): Payer: Self-pay

## 2021-10-07 MED ORDER — AZITHROMYCIN 250 MG PO TABS
ORAL_TABLET | ORAL | 0 refills | Status: DC
  Filled 2021-10-07: qty 6, 5d supply, fill #0

## 2021-10-07 MED ORDER — IBUPROFEN 800 MG PO TABS
ORAL_TABLET | ORAL | 0 refills | Status: DC
  Filled 2021-10-07: qty 21, 7d supply, fill #0

## 2021-10-07 MED ORDER — ACETAMINOPHEN-CODEINE #3 300-30 MG PO TABS
ORAL_TABLET | ORAL | 0 refills | Status: DC
  Filled 2021-10-07: qty 16, 4d supply, fill #0

## 2021-10-15 ENCOUNTER — Encounter: Payer: Self-pay | Admitting: Family Medicine

## 2021-10-15 ENCOUNTER — Other Ambulatory Visit (HOSPITAL_BASED_OUTPATIENT_CLINIC_OR_DEPARTMENT_OTHER): Payer: Self-pay

## 2021-10-15 ENCOUNTER — Ambulatory Visit (INDEPENDENT_AMBULATORY_CARE_PROVIDER_SITE_OTHER): Payer: No Typology Code available for payment source | Admitting: Family Medicine

## 2021-10-15 VITALS — BP 149/109 | HR 72 | Ht 72.0 in | Wt 285.0 lb

## 2021-10-15 DIAGNOSIS — K219 Gastro-esophageal reflux disease without esophagitis: Secondary | ICD-10-CM

## 2021-10-15 DIAGNOSIS — R7309 Other abnormal glucose: Secondary | ICD-10-CM | POA: Diagnosis not present

## 2021-10-15 DIAGNOSIS — R7303 Prediabetes: Secondary | ICD-10-CM | POA: Diagnosis not present

## 2021-10-15 LAB — POCT GLYCOSYLATED HEMOGLOBIN (HGB A1C): Hemoglobin A1C: 6.2 % — AB (ref 4.0–5.6)

## 2021-10-15 MED ORDER — METOCLOPRAMIDE HCL 5 MG PO TABS
5.0000 mg | ORAL_TABLET | Freq: Three times a day (TID) | ORAL | 1 refills | Status: DC | PRN
  Filled 2021-10-15: qty 60, 10d supply, fill #0
  Filled 2022-02-20: qty 60, 10d supply, fill #1

## 2021-10-15 MED ORDER — PANTOPRAZOLE SODIUM 40 MG PO TBEC
40.0000 mg | DELAYED_RELEASE_TABLET | Freq: Every day | ORAL | 3 refills | Status: DC
  Filled 2021-10-15: qty 30, 30d supply, fill #0
  Filled 2021-11-19: qty 30, 30d supply, fill #1
  Filled 2021-12-17: qty 30, 30d supply, fill #2
  Filled 2022-01-15: qty 30, 30d supply, fill #3

## 2021-10-15 NOTE — Assessment & Plan Note (Signed)
A1c improved compared to previous reading.  Low carbohydrate diet recommended. ?

## 2021-10-15 NOTE — Patient Instructions (Signed)
Start pantoprazole daily.  ?Use reglan 5-'10mg'$  daily as needed.  ?A1c is 6.2%.  This is in the prediabetes range.  Continue to work on dietary changes to help with this.  We can consider Ozempic to help with blood sugar and weight control if this remains elevated.   ?

## 2021-10-15 NOTE — Progress Notes (Signed)
?Joshua Brady - 46 y.o. male MRN [id number]  Date of birth: 1975-08-22 ? ?Subjective ?Chief Complaint  ?Patient presents with  ? Nausea  ? Emesis  ? Diabetes  ? ? ?HPI ?Joshua Brady is a 46 year old male here today with complaint of reflux and nausea.  He has had increased heartburn and reflux symptoms that are worse at night.  He has associated nausea and vomiting with this.  Vomiting can be quite forceful at times.  He was noted to have elevated A1c at 7.2%.  Prior to his previous surgery.  History of prediabetes but no diagnosis of diabetes.  He has not noticed any blood in his emesis.  Denies dark stools. ? ?ROS:  A comprehensive ROS was completed and negative except as noted per HPI ? ?Allergies  ?Allergen Reactions  ? Other   ?  Ants - see notes from ER visit 02/2018  ? Prednisone   ? Topiramate   ?  Anxiety, dizzy, depressed, suicidal thoughts, agitated  ? ? ?Past Medical History:  ?Diagnosis Date  ? Anxiety   ? Borderline high cholesterol 01/25/2017  ? 10-yr ASCVD 4.1%  ? Daytime somnolence 03/11/2018  ? Dyslipidemia (high LDL; low HDL) 01/25/2017  ? 10-yr ASCVD risk 4.1% (01/2017)  ? Head injury 2016  ? Heart murmur   ? History of brain concussion 01/21/2017  ? History of low back pain 01/21/2017  ? Hyperglycemia   ? Hypertension   ? Pre-hypertension per patient  ? 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 Low testosterone 04/29/2018  ? Lumbar degenerative disc disease 02/06/2020  ? Palpitations 03/03/2018  ? Sleep apnea 03/11/2018  ? SOB (shortness of breath) on exertion 03/03/2018  ? Viral illness 09/11/2019  ? ? ?Past Surgical History:  ?Procedure Laterality Date  ? Broken Arm    ? LEFT HEART CATH AND CORONARY ANGIOGRAPHY N/A 11/22/2020  ? Procedure: LEFT HEART CATH AND CORONARY ANGIOGRAPHY;  Surgeon: Troy Sine, MD;  Location: [facility];  Service: Cardiovascular;  Laterality: N/A;  ? LIVER BIOPSY    ? ? ?Social History  ? ?Socioeconomic History  ? Marital status: Married  ?  Spouse name:  [name]  ? Number of children: 2  ? Years of education: Not on file  ? Highest education level: Not on file  ?Occupational History  ? Not on file  ?Tobacco Use  ? Smoking status: Never  ?  Passive exposure: Never  ? Smokeless tobacco: Never  ?Vaping Use  ? Vaping Use: Never used  ?Substance and Sexual Activity  ? Alcohol use: Yes  ?  Comment: social  ? Drug use: No  ? Sexual activity: Yes  ?  Birth control/protection: None  ?Other Topics Concern  ? Not on file  ?Social History Narrative  ? Not on file  ? ?Social Determinants of Health  ? ?Financial Resource Strain: Not on file  ?Food Insecurity: Not on file  ?Transportation Needs: Not on file  ?Physical Activity: Not on file  ?Stress: Not on file  ?Social Connections: Not on file  ? ? ?Family History  ?Problem Relation Age of Onset  ? Alcohol abuse Father   ? Myelodysplastic syndrome Father   ? Other Neg Hx   ?     low testosterone  ? ? ?Health Maintenance  ?Topic Date Due  ? COVID-19 Vaccine (1) 02/13/2022 (Originally 02/27/1976)  ? COLONOSCOPY (Pts 45-57yr Insurance coverage will need to be confirmed)  10/16/2022 (Originally 08/28/2018)  ? Hepatitis C Screening  10/16/2022 (Originally 08/28/1991)  ? INFLUENZA VACCINE  01/13/2022  ? TETANUS/TDAP  10/31/2025  ? HIV Screening  Completed  ? HPV VACCINES  Aged Out  ? ? ? ?----------------------------------------------------------------------------------------------------------------------------------------------------------------------------------------------------------------- ?Physical Exam ?BP (!) 149/109   Pulse 72   Ht 6' (1.829 m)   Wt 285 lb (129.3 kg)   SpO2 96%   BMI 38.65 kg/m?  ? ?Physical Exam ?Constitutional:   ?   Appearance: Normal appearance.  ?Eyes:  ?   General: No scleral icterus. ?Cardiovascular:  ?   Rate and Rhythm: Normal rate and regular rhythm.  ?Pulmonary:  ?   Effort: Pulmonary effort is normal.  ?   Breath sounds: Normal breath sounds.  ?Abdominal:  ?   General: There is no  distension.  ?   Palpations: Abdomen is soft.  ?   Tenderness: There is no abdominal tenderness.  ?Musculoskeletal:  ?   Cervical back: Neck supple.  ?Neurological:  ?   Mental Status: He is alert.  ?Psychiatric:     ?   Mood and Affect: Mood normal.     ?   Behavior: Behavior normal.  ? ? ?------------------------------------------------------------------------------------------------------------------------------------------------------------------------------------------------------------------- ?Assessment and Plan ? ?GERD (gastroesophageal reflux disease) ?Is having increased reflux symptoms which is causing him to have nausea and vomiting.  Starting pantoprazole daily.  We will add Reglan as needed for nausea as it may be a component of gastroparesis contributing.  He does have a history of elevated glucose although this is improved from last A1c.  We discussed that if symptoms or not improving with these measures I would recommend referral to GI for endoscopy. ? ?Prediabetes ?A1c improved compared to previous reading.  Low carbohydrate diet recommended. ? ? ?Meds ordered this encounter  ?Medications  ? metoCLOPramide (REGLAN) 5 MG tablet  ?  Sig: Take 1-2 tablets (5-10 mg total) by mouth every 8 (eight) hours as needed for nausea.  ?  Dispense:  60 tablet  ?  Refill:  1  ? pantoprazole (PROTONIX) 40 MG tablet  ?  Sig: Take 1 tablet (40 mg total) by mouth daily.  ?  Dispense:  30 tablet  ?  Refill:  3  ? ? ?No follow-ups on file. ? ? ? ?This visit occurred during the SARS-CoV-2 public health emergency.  Safety protocols were in place, including screening questions prior to the visit, additional usage of staff PPE, and extensive cleaning of exam room while observing appropriate contact time as indicated for disinfecting solutions.  ? ?

## 2021-10-15 NOTE — Assessment & Plan Note (Signed)
Is having increased reflux symptoms which is causing him to have nausea and vomiting.  Starting pantoprazole daily.  We will add Reglan as needed for nausea as it may be a component of gastroparesis contributing.  He does have a history of elevated glucose although this is improved from last A1c.  We discussed that if symptoms or not improving with these measures I would recommend referral to GI for endoscopy. ?

## 2021-10-27 ENCOUNTER — Other Ambulatory Visit: Payer: Self-pay | Admitting: Thoracic Surgery (Cardiothoracic Vascular Surgery)

## 2021-10-27 DIAGNOSIS — J9859 Other diseases of mediastinum, not elsewhere classified: Secondary | ICD-10-CM

## 2021-10-28 ENCOUNTER — Ambulatory Visit (INDEPENDENT_AMBULATORY_CARE_PROVIDER_SITE_OTHER): Payer: No Typology Code available for payment source | Admitting: Thoracic Surgery (Cardiothoracic Vascular Surgery)

## 2021-10-28 ENCOUNTER — Encounter: Payer: Self-pay | Admitting: Thoracic Surgery (Cardiothoracic Vascular Surgery)

## 2021-10-28 ENCOUNTER — Ambulatory Visit
Admission: RE | Admit: 2021-10-28 | Discharge: 2021-10-28 | Disposition: A | Discharge status: Home / Self Care | Payer: No Typology Code available for payment source | Source: Ambulatory Visit | Attending: Thoracic Surgery (Cardiothoracic Vascular Surgery) | Admitting: Thoracic Surgery (Cardiothoracic Vascular Surgery)

## 2021-10-28 VITALS — BP 144/91 | HR 78 | Resp 20 | Ht 72.0 in | Wt 286.0 lb

## 2021-10-28 DIAGNOSIS — J9859 Other diseases of mediastinum, not elsewhere classified: Secondary | ICD-10-CM | POA: Diagnosis not present

## 2021-10-28 IMAGING — DX DG CHEST 2V
2 series · 2 of 2 positions shown · non-contrast
Comparison: [DATE].

CLINICAL DATA: Shortness of breath. Status post mediastinal
surgery.

EXAM:
CHEST - 2 VIEW

[dg chest 2 view (1 of 2)]
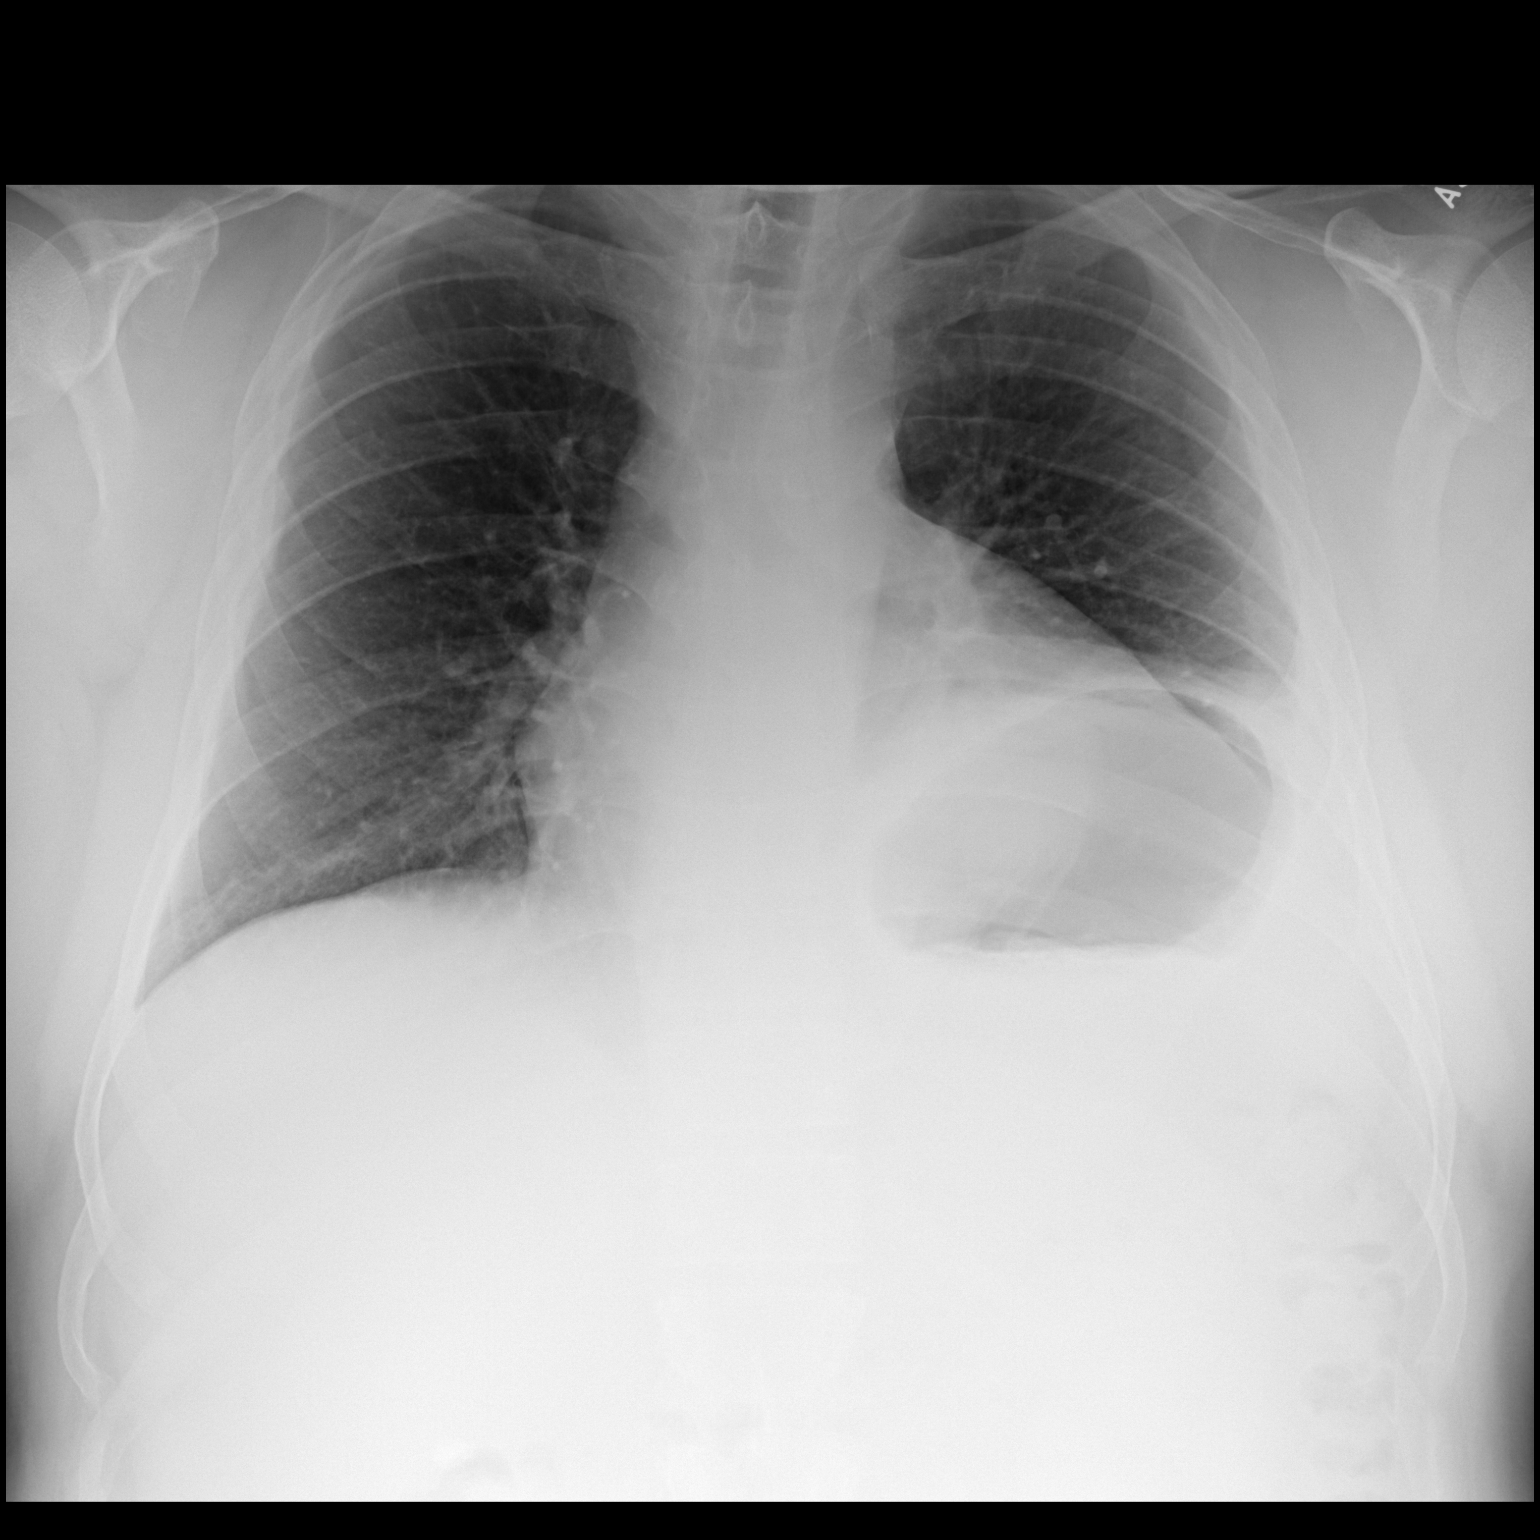

[dg chest 2 view (2 of 2)]
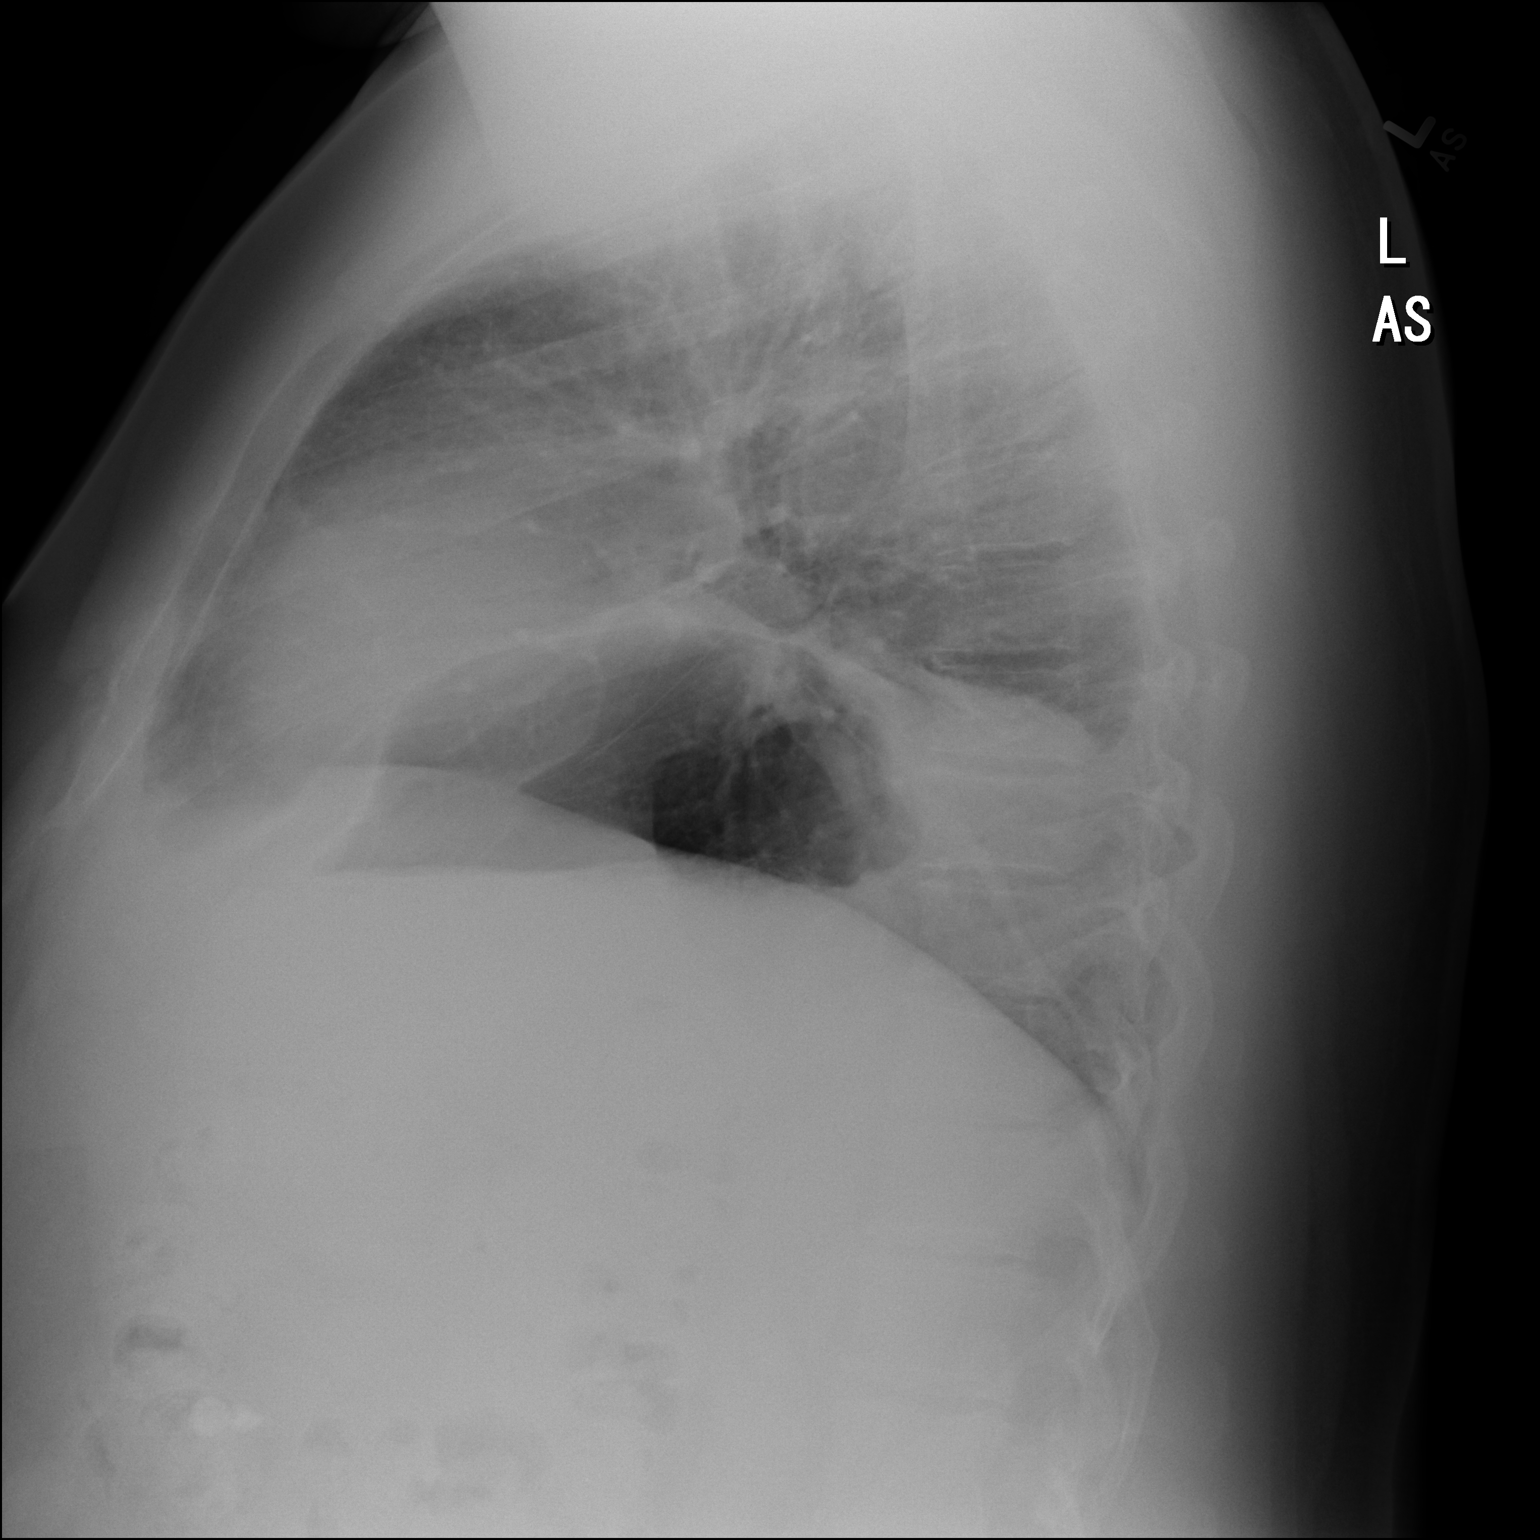

[2 of 2 positions shown; findings below may reference images not displayed]

FINDINGS: The heart size and mediastinal contours are within normal limits.
Right lung is clear. Stable elevated left hemidiaphragm is noted
with minimal left basilar subsegmental atelectasis. The visualized
skeletal structures are unremarkable.
IMPRESSION: Stable elevated left hemidiaphragm with minimal left basilar
subsegmental atelectasis.

## 2021-10-28 NOTE — Progress Notes (Signed)
? ?   ?  [address] ?      [place] [address] ?            [phone]   ? ?  ?Oct 28, 2021 ? ?To whom it may concern, ? ?Joshua Brady, DOB 1975/12/26, is currently under my care.  He has an elevated left hemidiaphragm following chest surgery.  His physical activity is limited currently and he is not able to lift heavy objects. ? ?He will need to remain out of work that requires physical activity until at least February 13, 2022.  He would potentially be able to do "desk job" duties prior to that. ? ?Sincerely ? ?Revonda Standard Roxan Hockey, MD ?[facility] ?[phone] ? ?

## 2021-10-28 NOTE — Progress Notes (Signed)
? ?   ?[address] ?      [place] [address] ?            [phone]   ? ? ?HPI: [name] returns for a scheduled follow-up visit ? ?Joshua Brady is a 46 year old man with a history of coronary disease, dyspnea, hypertension, hyperlipidemia, obesity, OSA, heart murmur, closed head injury, and a cystic mediastinal mass.  He had resection of a cystic mediastinal mass in January.  The mass was wrapped around the phrenic nerve.  Postoperatively he had an elevated left hemidiaphragm. ? ?In the interim since his last visit he does feel better.  He is not having any pain.  He does still get short of breath with activity.  He is able to do some light activities but still cannot lift anything heavy and gets tired even with working for very brief periods. ? ?Past Medical History:  ?Diagnosis Date  ? Anxiety   ? Borderline high cholesterol 01/25/2017  ? 10-yr ASCVD 4.1%  ? Daytime somnolence 03/11/2018  ? Dyslipidemia (high LDL; low HDL) 01/25/2017  ? 10-yr ASCVD risk 4.1% (01/2017)  ? Head injury 2016  ? Heart murmur   ? History of brain concussion 01/21/2017  ? History of low back pain 01/21/2017  ? Hyperglycemia   ? Hypertension   ? Pre-hypertension per patient  ? 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 Low testosterone 04/29/2018  ? Lumbar degenerative disc disease 02/06/2020  ? Palpitations 03/03/2018  ? Sleep apnea 03/11/2018  ? SOB (shortness of breath) on exertion 03/03/2018  ? Viral illness 09/11/2019  ? ? ?Current Outpatient Medications  ?Medication Sig Dispense Refill  ? acetaminophen-codeine (TYLENOL #3) 300-30 MG tablet Take 1 tablet by mouth every 6 hours as needed for pain 16 tablet 0  ? aspirin EC 81 MG tablet Take 1 tablet (81 mg total) by mouth daily. Swallow whole. 90 tablet 3  ? atorvastatin (LIPITOR) 40 MG tablet Take 1 tablet (40 mg total) by mouth daily. 30 tablet 6  ? carvedilol (COREG) 6.25 MG tablet Take 1 tablet (6.25 mg total) by mouth 2 (two) times daily with a meal. 180  tablet 2  ? Coenzyme Q10 (CO Q-10 PO) Take 1 tablet by mouth daily at 12 noon. Unknown strenght    ? ibuprofen (ADVIL) 800 MG tablet Take 1 tablet by mouth every 8 hours as needed 21 tablet 0  ? metoCLOPramide (REGLAN) 5 MG tablet Take 1-2 tablets (5-10 mg total) by mouth every 8 (eight) hours as needed for nausea. 60 tablet 1  ? OVER THE COUNTER MEDICATION Take 1 tablet by mouth daily at 12 noon. Beet root/Unknown strength    ? pantoprazole (PROTONIX) 40 MG tablet Take 1 tablet (40 mg total) by mouth daily. 30 tablet 3  ? ?No current facility-administered medications for this visit.  ? ? ?Physical Exam ?BP (!) 144/91 (BP Location: Right Arm, Patient Position: Sitting)   Pulse 78   Resp 20   Ht 6' (1.829 m)   Wt 286 lb (129.7 kg)   SpO2 95% Comment: Ra  BMI 38.79 kg/m?  ?46 year old man in no acute distress ?Alert and oriented x3 with no focal deficits ?Diminished breath sounds at left base, otherwise clear ? ?Diagnostic Tests: ?I personally reviewed the chest x-ray.  Elevated left hemidiaphragm. ? ?Impression: ?Joshua Brady is a 46 year old man with a history of coronary disease, dyspnea, hypertension, hyperlipidemia, obesity, OSA, heart murmur, closed head injury, and a cystic mediastinal mass.  He had resection of a cystic mediastinal mass in January.  The mass was wrapped around the phrenic nerve.  Postoperatively he had an elevated left hemidiaphragm. ? 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  Either way, it was benign and has been resected. ? ?Elevated left hemidiaphragm-no change on chest x-ray today.  Usually takes greater than 6 months for function to return.  If no return to function after a year can consider plication of the diaphragm. ? ?His work involves lifting so he is not ready to return to work on June 1 as hoped.  We will plan to keep him out until September and hope he has some improvement by then. ? ?I did encourage him to  push himself.  There is no reason for him not to try to maximize his fitness.  He may have some top end limitations but he is not going to hurt himself by exerting. ? ?Plan: ?Return in 3 months with PA and lateral chest x-ray ? ?[name], MD ?[facility] ?[phone] ? ? ? ?

## 2021-11-03 ENCOUNTER — Other Ambulatory Visit (HOSPITAL_BASED_OUTPATIENT_CLINIC_OR_DEPARTMENT_OTHER): Payer: Self-pay

## 2021-11-19 ENCOUNTER — Other Ambulatory Visit (HOSPITAL_BASED_OUTPATIENT_CLINIC_OR_DEPARTMENT_OTHER): Payer: Self-pay

## 2021-11-28 ENCOUNTER — Other Ambulatory Visit (HOSPITAL_BASED_OUTPATIENT_CLINIC_OR_DEPARTMENT_OTHER): Payer: Self-pay

## 2021-12-04 ENCOUNTER — Other Ambulatory Visit (HOSPITAL_BASED_OUTPATIENT_CLINIC_OR_DEPARTMENT_OTHER): Payer: Self-pay

## 2021-12-08 ENCOUNTER — Encounter: Payer: Self-pay | Admitting: Thoracic Surgery (Cardiothoracic Vascular Surgery)

## 2021-12-12 ENCOUNTER — Other Ambulatory Visit (HOSPITAL_BASED_OUTPATIENT_CLINIC_OR_DEPARTMENT_OTHER): Payer: Self-pay

## 2021-12-17 ENCOUNTER — Other Ambulatory Visit (HOSPITAL_BASED_OUTPATIENT_CLINIC_OR_DEPARTMENT_OTHER): Payer: Self-pay

## 2021-12-22 ENCOUNTER — Other Ambulatory Visit (HOSPITAL_BASED_OUTPATIENT_CLINIC_OR_DEPARTMENT_OTHER): Payer: Self-pay

## 2021-12-29 ENCOUNTER — Other Ambulatory Visit (HOSPITAL_BASED_OUTPATIENT_CLINIC_OR_DEPARTMENT_OTHER): Payer: Self-pay

## 2021-12-31 ENCOUNTER — Encounter: Payer: Self-pay | Admitting: Thoracic Surgery (Cardiothoracic Vascular Surgery)

## 2022-01-01 ENCOUNTER — Other Ambulatory Visit (HOSPITAL_BASED_OUTPATIENT_CLINIC_OR_DEPARTMENT_OTHER): Payer: Self-pay

## 2022-01-16 ENCOUNTER — Other Ambulatory Visit (HOSPITAL_BASED_OUTPATIENT_CLINIC_OR_DEPARTMENT_OTHER): Payer: Self-pay

## 2022-01-19 ENCOUNTER — Other Ambulatory Visit: Payer: Self-pay | Admitting: Thoracic Surgery (Cardiothoracic Vascular Surgery)

## 2022-01-19 DIAGNOSIS — J9859 Other diseases of mediastinum, not elsewhere classified: Secondary | ICD-10-CM

## 2022-01-20 ENCOUNTER — Ambulatory Visit
Admission: RE | Admit: 2022-01-20 | Discharge: 2022-01-20 | Disposition: A | Discharge status: Home / Self Care | Payer: No Typology Code available for payment source | Source: Ambulatory Visit | Attending: Thoracic Surgery (Cardiothoracic Vascular Surgery) | Admitting: Thoracic Surgery (Cardiothoracic Vascular Surgery)

## 2022-01-20 ENCOUNTER — Encounter: Payer: Self-pay | Admitting: Thoracic Surgery (Cardiothoracic Vascular Surgery)

## 2022-01-20 ENCOUNTER — Ambulatory Visit (INDEPENDENT_AMBULATORY_CARE_PROVIDER_SITE_OTHER): Payer: No Typology Code available for payment source | Admitting: Thoracic Surgery (Cardiothoracic Vascular Surgery)

## 2022-01-20 VITALS — BP 142/92 | HR 100 | Resp 20 | Ht 72.0 in | Wt 287.3 lb

## 2022-01-20 DIAGNOSIS — Z09 Encounter for follow-up examination after completed treatment for conditions other than malignant neoplasm: Secondary | ICD-10-CM | POA: Diagnosis not present

## 2022-01-20 DIAGNOSIS — J9859 Other diseases of mediastinum, not elsewhere classified: Secondary | ICD-10-CM

## 2022-01-20 NOTE — Progress Notes (Signed)
[address]       [place] [address]             [phone]    HPI: [name] returns for follow-up of his elevated left hemidiaphragm.  Joshua Brady is a 46 year old man with a history of CAD, dyspnea, hypertension, hyperlipidemia, obesity, obstructive sleep apnea, heart murmur, closed head injury, and a cystic mediastinal mass.  I did a robotic assisted resection of the mediastinal mass in January.  The mass was wrapped around the phrenic nerve but we were able to remove it without dividing the nerve.  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  He has had an elevated left hemidiaphragm since the operation.  Recently has been having some pain around the anterior left shoulder pectoralis area in the vicinity of his superior incision, says it feels like a pulled muscle.  No other pain.  He has returned to work on light duty but has been working 12-hour days.  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  He says that some weeks he feels pretty good, and then other weeks he does not have much energy at all.   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 Swallow whole. 90 tablet 3   atorvastatin (LIPITOR) 40 MG tablet Take 1 tablet (40 mg total) by mouth daily. 30 tablet 6   carvedilol (COREG) 6.25 MG tablet Take 1 tablet (6.25 mg total) by mouth 2 (two) times daily with a meal. 180 tablet 2   Coenzyme Q10 (CO Q-10 PO) Take 1 tablet by mouth daily at 12 noon. Unknown strenght     ibuprofen (ADVIL) 800 MG tablet Take 1 tablet by mouth every 8 hours as needed 21 tablet 0   metoCLOPramide (REGLAN) 5 MG tablet Take 1-2 tablets (5-10 mg total) by mouth every 8 (eight) hours as needed for nausea. 60 tablet 1   OVER THE COUNTER MEDICATION Take 1 tablet by mouth daily at 12 noon. Beet root/Unknown strength     pantoprazole (PROTONIX) 40 MG tablet Take 1 tablet (40 mg total) by mouth daily. 30 tablet 3   No current facility-administered medications for this visit.    Physical Exam BP (!) 142/92 (BP Location: Left Arm, Patient Position: Sitting, Cuff Size: Large)   Pulse 100   Resp 20   Ht 6' (1.829 m)   Wt 287 lb 4.8 oz (130.3 kg)   SpO2 95% Comment: RA  BMI 38.96  kg/m  46 year old man in no acute distress Alert and oriented x 3 with no focal deficits Lungs absent breath sounds left base, otherwise clear Incisions well-healed  Diagnostic Tests: I personally reviewed his chest x-ray.  No change in elevated left hemidiaphragm.  Impression: Joshua Brady is a 46 year old male with a history of CAD, dyspnea, hypertension, hyperlipidemia, obesity, OSA, murmur, closed head injury, and a cystic mediastinal mass.  Has had a persistently elevated left hemidiaphragm from surgery.  Symptomatically has improved significantly but is not back to his baseline.  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  Will check another chest x-ray in about 4 months.  That will be almost a year from the surgery.  If still  elevated could consider plication of the diaphragm.  Plan: Return in 4 months with PA lateral chest x-ray In the meantime continue with light duty at work  [name], MD [facility] [phone]

## 2022-01-22 ENCOUNTER — Encounter: Payer: Self-pay | Admitting: Thoracic Surgery (Cardiothoracic Vascular Surgery)

## 2022-01-23 ENCOUNTER — Encounter: Payer: Self-pay | Admitting: *Deleted

## 2022-01-26 ENCOUNTER — Ambulatory Visit: Payer: No Typology Code available for payment source | Admitting: Cardiology

## 2022-01-26 ENCOUNTER — Encounter: Payer: Self-pay | Admitting: Cardiology

## 2022-01-26 VITALS — BP 138/98 | HR 77 | Ht 72.0 in | Wt 289.1 lb

## 2022-01-26 DIAGNOSIS — I10 Essential (primary) hypertension: Secondary | ICD-10-CM | POA: Diagnosis not present

## 2022-01-26 DIAGNOSIS — I318 Other specified diseases of pericardium: Secondary | ICD-10-CM

## 2022-01-26 DIAGNOSIS — E785 Hyperlipidemia, unspecified: Secondary | ICD-10-CM | POA: Diagnosis not present

## 2022-01-26 DIAGNOSIS — I2510 Atherosclerotic heart disease of native coronary artery without angina pectoris: Secondary | ICD-10-CM | POA: Diagnosis not present

## 2022-01-26 DIAGNOSIS — R7303 Prediabetes: Secondary | ICD-10-CM

## 2022-01-26 NOTE — Progress Notes (Signed)
Cardiology Office Note:    Date:  01/26/2022   ID:  Joshua Brady, DOB 08/28/1975, MRN [id number]  PCP:  Luetta Nutting, DO  Cardiologist:  Jenne Campus, MD    Referring MD: Luetta Nutting, DO   Chief Complaint  Patient presents with   Follow-up  Doing very well  History of Present Illness:    Joshua Brady is a 46 y.o. 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  Since that time he is doing dramatically better.  There is no chest pain tightness squeezing pressure burning chest.  He did report a few months ago having some chest pain look like more musculoskeletal but overall a lot of things improving his life.  He is trying to gradually go back to his exercise routine.  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   Low testosterone 04/29/2018   Lumbar degenerative disc disease 02/06/2020   Palpitations 03/03/2018   Sleep apnea 03/11/2018   SOB (shortness of breath) on exertion 03/03/2018   Viral illness 09/11/2019    Past Surgical History:  Procedure Laterality Date   Broken Arm     LEFT HEART CATH AND CORONARY ANGIOGRAPHY N/A 11/22/2020   Procedure: LEFT HEART CATH AND CORONARY ANGIOGRAPHY;  Surgeon: Troy Sine, MD;  Location: [facility];  Service: Cardiovascular;  Laterality: N/A;   LIVER BIOPSY      Current Medications: Current Meds  Medication Sig    acetaminophen-codeine (TYLENOL #3) 300-30 MG tablet Take 1 tablet by mouth every 6 hours as needed for pain (Patient taking differently: Take 1 tablet by mouth every 8 (eight) hours as needed for moderate pain or severe pain.)   aspirin EC 81 MG tablet Take 1 tablet (81 mg total) by mouth daily. Swallow whole.   atorvastatin (LIPITOR) 40 MG tablet Take 1 tablet (40 mg total) by mouth daily.   carvedilol (COREG) 6.25 MG tablet Take 1 tablet (6.25 mg total) by mouth 2 (two) times daily with a meal.   Coenzyme Q10 (CO Q-10 PO) Take 1 tablet by mouth daily at 12 noon. Unknown strenght   ibuprofen (ADVIL) 800 MG tablet Take 1 tablet by mouth every 8 hours as needed (Patient taking differently: Take 800 mg by mouth every 8 (eight) hours as needed for mild pain or moderate pain.)   metoCLOPramide (REGLAN) 5 MG tablet Take 1-2 tablets (5-10 mg total) by mouth every 8 (eight) hours as needed for nausea.   OVER THE COUNTER MEDICATION Take 1 tablet by mouth daily at 12 noon. Beet root/Unknown strength   pantoprazole (PROTONIX) 40 MG tablet Take 1 tablet (40 mg total) by mouth daily.     Allergies:   Other, Prednisone, and Topiramate   Social History   Socioeconomic History   Marital status: Married    Spouse name: Haseeb Fiallos   Number of children: 2  Years of education: Not on file   Highest education level: Not on file  Occupational History   Not on file  Tobacco Use   Smoking status: Never    Passive exposure: Never   Smokeless tobacco: Never  Vaping Use   Vaping Use: Never used  Substance and Sexual Activity   Alcohol use: Yes    Comment: social   Drug use: No   Sexual activity: Yes    Birth control/protection: None  Other Topics Concern   Not on file  Social History Narrative   Not on file   Social Determinants of Health   Financial Resource Strain: Not on file  Food Insecurity: Not on file  Transportation Needs: Not on file  Physical Activity: Not on file  Stress: Not on  file  Social Connections: Not on file     Family History: The patient's family history includes Alcohol abuse in his father; Myelodysplastic syndrome in his father. There is no history of Other. ROS:   Please see the history of present illness.    All 14 point review of systems negative except as described per history of present illness  EKGs/Labs/Other Studies Reviewed:      Recent Labs: 06/15/2021: B Natriuretic Peptide 10.2; Magnesium 1.8 07/05/2021: ALT 49; BUN 16; Creatinine, Ser 0.85; Hemoglobin 13.0; Platelets 270; Potassium 4.0; Sodium 135  Recent Lipid Panel    Component Value Date/Time   CHOL 170 01/02/2021 0844   TRIG 163 (H) 01/02/2021 0844   HDL 36 (L) 01/02/2021 0844   CHOLHDL 4.7 01/02/2021 0844   CHOLHDL 6.8 (H) 03/03/2018 0914   VLDL 45 (H) 01/21/2017 0859   LDLCALC 105 (H) 01/02/2021 0844   LDLCALC 192 (H) 03/03/2018 0914    Physical Exam:    VS:  BP (!) 136/92 (BP Location: Left Arm, Patient Position: Sitting)   Pulse 77   Ht 6' (1.829 m)   Wt 289 lb 1.3 oz (131.1 kg)   SpO2 96%   BMI 39.21 kg/m     Wt Readings from Last 3 Encounters:  01/26/22 289 lb 1.3 oz (131.1 kg)  01/20/22 287 lb 4.8 oz (130.3 kg)  10/28/21 286 lb (129.7 kg)     GEN:  Well nourished, well developed in no acute distress HEENT: Normal NECK: No JVD; No carotid bruits LYMPHATICS: No lymphadenopathy CARDIAC: RRR, no murmurs, no rubs, no gallops RESPIRATORY:  Clear to auscultation without rales, wheezing or rhonchi  ABDOMEN: Soft, non-tender, non-distended MUSCULOSKELETAL:  No edema; No deformity  SKIN: Warm and dry LOWER EXTREMITIES: no swelling NEUROLOGIC:  Alert and oriented x 3 PSYCHIATRIC:  Normal affect   ASSESSMENT:    1. Coronary artery disease involving native coronary artery of native heart without angina pectoris   2. Pericardial mass   3. Primary hypertension   4. Dyslipidemia   5. Prediabetes    PLAN:    In order of problems listed above:  Status post  pericardial mass removal.  He is doing very well from that point review he still have elevation of the left diaphragma and some shortness of breath but gradually getting better. Coronary disease only mild.  The key is risk factors modifications.  He is on aspirin which I will continue, I will check his fasting lipid profile today.  He is on Lipitor 40 Dyslipidemia did review K PN which show his LDL of 105 HDL 36.  He is on Lipitor 40 which I will continue. Essential hypertension blood pressure mildly elevated today we  will recheck it before he get out of the room. I have suspicion that he has some sleep apnea however he is to have study done before it is negative   Medication Adjustments/Labs and Tests Ordered: Current medicines are reviewed at length with the patient today.  Concerns regarding medicines are outlined above.  No orders of the defined types were placed in this encounter.  Medication changes: No orders of the defined types were placed in this encounter.   Signed, Park Liter, MD, [facility] 01/26/2022 8:23 AM    [place]

## 2022-01-26 NOTE — Patient Instructions (Addendum)
Medication Instructions:  Your physician recommends that you continue on your current medications as directed. Please refer to the Current Medication list given to you today.  *If you need a refill on your cardiac medications before your next appointment, please call your pharmacy*   Lab Work: Fasting Lipid 2nd [place] If you have labs (blood work) drawn today and your tests are completely normal, you will receive your results only by: [place] (if you have MyChart) OR A paper copy in the mail If you have any lab test that is abnormal or we need to change your treatment, we will call you to review the results.   Testing/Procedures: None Ordered   Follow-Up: At [facility], you and your health needs are our priority.  As part of our continuing mission to provide you with exceptional heart care, we have created designated Provider Care Teams.  These Care Teams include your primary Cardiologist (physician) and Advanced Practice Providers (APPs -  Physician Assistants and Nurse Practitioners) who all work together to provide you with the care you need, when you need it.  We recommend signing up for the patient portal called "MyChart".  Sign up information is provided on this After Visit Summary.  MyChart is used to connect with patients for Virtual Visits (Telemedicine).  Patients are able to view lab/test results, encounter notes, upcoming appointments, etc.  Non-urgent messages can be sent to your provider as well.   To learn more about what you can do with MyChart, go to [email or web address].    Your next appointment:   12 months  The format for your next appointment:   In Person  Provider:   Jenne Campus, MD    Other Instructions NA

## 2022-01-27 LAB — LIPID PANEL
Chol/HDL Ratio: 4.3 ratio (ref 0.0–5.0)
Cholesterol, Total: 178 mg/dL (ref 100–199)
HDL: 41 mg/dL (ref >39)
LDL Chol Calc (NIH): 111 mg/dL — ABNORMAL HIGH (ref 0–99)
Triglycerides: 146 mg/dL (ref 0–149)
VLDL Cholesterol Cal: 26 mg/dL (ref 5–40)

## 2022-02-04 ENCOUNTER — Other Ambulatory Visit (HOSPITAL_BASED_OUTPATIENT_CLINIC_OR_DEPARTMENT_OTHER): Payer: Self-pay

## 2022-02-04 ENCOUNTER — Telehealth: Payer: Self-pay

## 2022-02-04 DIAGNOSIS — E785 Hyperlipidemia, unspecified: Secondary | ICD-10-CM

## 2022-02-04 MED ORDER — ATORVASTATIN CALCIUM 80 MG PO TABS
80.0000 mg | ORAL_TABLET | Freq: Every day | ORAL | 3 refills | Status: DC
  Filled 2022-02-04: qty 90, 90d supply, fill #0
  Filled 2022-05-01: qty 90, 90d supply, fill #1
  Filled 2022-09-04 – 2022-09-22 (×3): qty 30, 30d supply, fill #2
  Filled 2022-10-16: qty 30, 30d supply, fill #3
  Filled 2022-11-29: qty 30, 30d supply, fill #4
  Filled 2022-12-25: qty 30, 30d supply, fill #5
  Filled 2023-01-25: qty 30, 30d supply, fill #6

## 2022-02-04 NOTE — Telephone Encounter (Signed)
Results reviewed with pt as per Dr. Wendy Poet note. Pt agreed to double Lipitor and re check blood work in 6 weeks.  Pt verbalized understanding and had no additional questions. Routed to PCP

## 2022-02-11 ENCOUNTER — Other Ambulatory Visit (HOSPITAL_BASED_OUTPATIENT_CLINIC_OR_DEPARTMENT_OTHER): Payer: Self-pay

## 2022-02-19 ENCOUNTER — Encounter (HOSPITAL_BASED_OUTPATIENT_CLINIC_OR_DEPARTMENT_OTHER): Payer: Self-pay | Admitting: Emergency Medicine

## 2022-02-19 ENCOUNTER — Other Ambulatory Visit: Payer: Self-pay

## 2022-02-19 ENCOUNTER — Emergency Department (HOSPITAL_BASED_OUTPATIENT_CLINIC_OR_DEPARTMENT_OTHER)
Admission: EM | Admit: 2022-02-19 | Discharge: 2022-02-19 | Disposition: A | Discharge status: Home / Self Care | Payer: No Typology Code available for payment source | Attending: Emergency Medicine | Admitting: Emergency Medicine

## 2022-02-19 ENCOUNTER — Emergency Department (HOSPITAL_BASED_OUTPATIENT_CLINIC_OR_DEPARTMENT_OTHER): Payer: No Typology Code available for payment source

## 2022-02-19 DIAGNOSIS — R0789 Other chest pain: Secondary | ICD-10-CM | POA: Diagnosis present

## 2022-02-19 DIAGNOSIS — R5383 Other fatigue: Secondary | ICD-10-CM | POA: Diagnosis not present

## 2022-02-19 DIAGNOSIS — Z7982 Long term (current) use of aspirin: Secondary | ICD-10-CM | POA: Diagnosis not present

## 2022-02-19 DIAGNOSIS — R079 Chest pain, unspecified: Secondary | ICD-10-CM

## 2022-02-19 LAB — CBC
HCT: 42.4 % (ref 39.0–52.0)
Hemoglobin: 14.3 g/dL (ref 13.0–17.0)
MCH: 28.4 pg (ref 26.0–34.0)
MCHC: 33.7 g/dL (ref 30.0–36.0)
MCV: 84.1 fL (ref 80.0–100.0)
Platelets: 298 10*3/uL (ref 150–400)
RBC: 5.04 MIL/uL (ref 4.22–5.81)
RDW: 13.1 % (ref 11.5–15.5)
WBC: 9.8 10*3/uL (ref 4.0–10.5)
nRBC: 0 % (ref 0.0–0.2)

## 2022-02-19 LAB — BASIC METABOLIC PANEL
Anion gap: 9 (ref 5–15)
BUN: 12 mg/dL (ref 6–20)
CO2: 25 mmol/L (ref 22–32)
Calcium: 9.3 mg/dL (ref 8.9–10.3)
Chloride: 102 mmol/L (ref 98–111)
Creatinine, Ser: 1.01 mg/dL (ref 0.61–1.24)
GFR, Estimated: >60 mL/min (ref >60)
Glucose, Bld: 114 mg/dL — ABNORMAL HIGH (ref 70–99)
Potassium: 4.1 mmol/L (ref 3.5–5.1)
Sodium: 136 mmol/L (ref 135–145)

## 2022-02-19 LAB — TROPONIN I (HIGH SENSITIVITY): Troponin I (High Sensitivity): 6 ng/L (ref <18)

## 2022-02-19 MED ORDER — DICLOFENAC SODIUM 1 % EX GEL: 4.0000 g | Freq: Four times a day (QID) | CUTANEOUS | 0 refills | Status: DC

## 2022-02-19 NOTE — Discharge Instructions (Signed)
Use the gel as prescribed. Also take tylenol '1000mg'$ (2 extra strength) four times a day.   I know you do not like taking medicine for pain but with chest wall pain sometimes it is very difficult to get over it because you continually use it.  Try the gel as prescribed.  Please call your family doctor and discuss your visit they may choose to see you sooner in the office.  Please return for worsening pain.

## 2022-02-19 NOTE — ED Triage Notes (Signed)
Pt arrives pov, slow gait, c/o CP, shob, fatigue and nausea. Endorses collapsed lung recently

## 2022-02-19 NOTE — ED Notes (Signed)
Pt d/c home per MD order. Discharge summary reviewed with pt, pt verbalizes understanding. Ambulatory off unit with visitor. No s/s of acute distress noted at discharge.

## 2022-02-19 NOTE — ED Provider Notes (Signed)
[facility] EMERGENCY DEPARTMENT Provider Note   CSN: [id number] Arrival date & time: 02/19/22  1837     History  Chief Complaint  Patient presents with   Chest Pain    Joshua Brady is a 46 y.o. male.  47 yoM with a chief complaints of left-sided chest discomfort.  This has been going on ever since he had a cardiothoracic procedure to remove a cyst around his heart.  He describes it as a pulling sensation.  More on the left than the right.  He thought that maybe he had strained a muscle in his chest wall while he was at work but unfortunately has not improved.  He does not like to take any medications for pain and so has not tried Tylenol or ibuprofen or any other over-the-counter medication.  He denies any overt trauma to the chest.  He fell like his symptoms got a little bit worse over the past 24 hours and had felt a little bit fatigued and so came to the emergency department for evaluation.  He denies cough congestion or fever.   Chest Pain      Home Medications Prior to Admission medications   Medication Sig Start Date End Date Taking? Authorizing Provider  diclofenac Sodium (VOLTAREN) 1 % GEL Apply 4 g topically 4 (four) times daily. 02/19/22  Yes Deno Etienne, DO  acetaminophen-codeine (TYLENOL #3) 300-30 MG tablet Take 1 tablet by mouth every 6 hours as needed for pain Patient taking differently: Take 1 tablet by mouth every 8 (eight) hours as needed for moderate pain or severe pain. 10/07/21     aspirin EC 81 MG tablet Take 1 tablet (81 mg total) by mouth daily. Swallow whole. 10/29/20   Park Liter, MD  atorvastatin (LIPITOR) 80 MG tablet Take 1 tablet (80 mg total) by mouth daily. 02/04/22   Park Liter, MD  carvedilol (COREG) 6.25 MG tablet Take 1 tablet (6.25 mg total) by mouth 2 (two) times daily with a meal. 09/02/21   Park Liter, MD  Coenzyme Q10 (CO Q-10 PO) Take 1 tablet by mouth daily at 12 noon. Unknown strenght    [provider]  ibuprofen (ADVIL) 800 MG tablet Take 1 tablet by mouth every 8 hours as needed Patient taking differently: Take 800 mg by mouth every 8 (eight) hours as needed for mild pain or moderate pain. 10/07/21     metoCLOPramide (REGLAN) 5 MG tablet Take 1-2 tablets (5-10 mg total) by mouth every 8 (eight) hours as needed for nausea. 10/15/21   Luetta Nutting, DO  OVER THE COUNTER MEDICATION Take 1 tablet by mouth daily at 12 noon. Beet root/Unknown strength    [provider]  pantoprazole (PROTONIX) 40 MG tablet Take 1 tablet (40 mg total) by mouth daily. 10/15/21   Luetta Nutting, DO      Allergies    Other, Prednisone, and Topiramate    Review of Systems   Review of Systems  Cardiovascular:  Positive for chest pain.    Physical Exam Updated Vital Signs BP (!) 131/92   Pulse 87   Temp 98.4 F (36.9 C)   Resp 13   Ht 6' (1.829 m)   Wt 129.3 kg   SpO2 94%   BMI 38.65 kg/m  Physical Exam Vitals and nursing note reviewed.  Constitutional:      Appearance: He is well-developed.  HENT:     Head: Normocephalic and atraumatic.  Eyes:     Pupils:  Pupils are equal, round, and reactive to light.  Neck:     Vascular: No JVD.  Cardiovascular:     Rate and Rhythm: Normal rate and regular rhythm.     Heart sounds: No murmur heard.    No friction rub. No gallop.  Pulmonary:     Effort: No respiratory distress.     Breath sounds: No wheezing.  Chest:     Chest wall: Tenderness present.     Comments: Pain with palpation over the left anterior chest wall about the medial clavicular line about ribs 4 through 6 reproduces discomfort. Abdominal:     General: There is no distension.     Tenderness: There is no abdominal tenderness. There is no guarding or rebound.  Musculoskeletal:        General: Normal range of motion.     Cervical back: Normal range of motion and neck supple.  Skin:    Coloration: Skin is not pale.     Findings: No rash.  Neurological:     Mental Status: He is  alert and oriented to person, place, and time.  Psychiatric:        Behavior: Behavior normal.     ED Results / Procedures / Treatments   Labs (all labs ordered are listed, but only abnormal results are displayed) Labs Reviewed  BASIC METABOLIC PANEL - Abnormal; Notable for the following components:      Result Value   Glucose, Bld 114 (*)    All other components within normal limits  CBC  TROPONIN I (HIGH SENSITIVITY)  TROPONIN I (HIGH SENSITIVITY)    EKG EKG Interpretation  Date/Time:  Thursday February 19 2022 18:56:50 EDT Ventricular Rate:  85 PR Interval:  159 QRS Duration: 87 QT Interval:  344 QTC Calculation: 409 R Axis:   43 Text Interpretation: Sinus rhythm No significant change since last tracing Confirmed by Deno Etienne [phone]) on 02/19/2022 9:13:50 PM  Radiology DG Chest 2 View  Result Date: 02/19/2022 CLINICAL DATA:  Chest pain and shortness of breath. EXAM: CHEST - 2 VIEW COMPARISON:  January 20, 2022 chest radiograph FINDINGS: Stable enlarged cardiac and mediastinal contours. Elevation left hemidiaphragm. Left basilar atelectasis. No pleural effusion or pneumothorax. Osseous structures unremarkable. IMPRESSION: Left basilar atelectasis. Electronically Signed   By: Lovey Newcomer M.D.   On: 02/19/2022 19:15    Procedures Procedures    Medications Ordered in ED Medications - No data to display  ED Course/ Medical Decision Making/ A&P                           Medical Decision Making Amount and/or Complexity of Data Reviewed Labs: ordered. Radiology: ordered.   46 yo M with a chief complaint of chest pain.  This is atypical in nature and reproduced on exam.  His EKG is nonischemic in my independent interpretation.  Chest x-ray independently interpreted by me without focal infiltrate or pneumothorax.  His troponin is negative.  No significant electrolyte abnormality no anemia.  Patient has been seen by both his cardiologist and cardiothoracic surgeon in the past  few weeks.  Pain was thought to be musculoskeletal.  He has had pain greater than 6 hours without significant change do not feel delta is warranted.  Feels completely atypical of pulmonary embolism.  We will have him follow-up with cardiothoracic surgeon in the office.  Patient is a bit wary about trying an over the counter medication to help him with this  discomfort.  We will try a course of diclofenac gel.  9:46 PM:  I have discussed the diagnosis/risks/treatment options with the patient.  Evaluation and diagnostic testing in the emergency department does not suggest an emergent condition requiring admission or immediate intervention beyond what has been performed at this time.  They will follow up with PCP, CT surgery. We also discussed returning to the ED immediately if new or worsening sx occur. We discussed the sx which are most concerning (e.g., sudden worsening pain, fever, inability to tolerate by mouth) that necessitate immediate return. Medications administered to the patient during their visit and any new prescriptions provided to the patient are listed below.  Medications given during this visit Medications - No data to display   The patient appears reasonably screen and/or stabilized for discharge and I doubt any other medical condition or other [facility] requiring further screening, evaluation, or treatment in the ED at this time prior to discharge.          Final Clinical Impression(s) / ED Diagnoses Final diagnoses:  Nonspecific chest pain    Rx / DC Orders ED Discharge Orders          Ordered    diclofenac Sodium (VOLTAREN) 1 % GEL  4 times daily        02/19/22 2142              Deno Etienne, DO 02/19/22 2146

## 2022-02-20 ENCOUNTER — Telehealth: Payer: Self-pay | Admitting: General Practice

## 2022-02-20 ENCOUNTER — Other Ambulatory Visit: Payer: Self-pay | Admitting: Family Medicine

## 2022-02-20 ENCOUNTER — Other Ambulatory Visit (HOSPITAL_BASED_OUTPATIENT_CLINIC_OR_DEPARTMENT_OTHER): Payer: Self-pay

## 2022-02-20 MED ORDER — PANTOPRAZOLE SODIUM 40 MG PO TBEC
40.0000 mg | DELAYED_RELEASE_TABLET | Freq: Every day | ORAL | 3 refills | Status: DC
  Filled 2022-02-20: qty 30, 30d supply, fill #0
  Filled 2022-03-20 – 2022-04-08 (×3): qty 30, 30d supply, fill #1
  Filled 2022-05-13: qty 30, 30d supply, fill #2
  Filled 2022-05-15 – 2022-06-12 (×4): qty 30, 30d supply, fill #0

## 2022-02-20 NOTE — Telephone Encounter (Signed)
Transition Care Management Follow-up Telephone Call Date of discharge and from where: 02/19/22 from [facility] How have you been since you were released from the hospital? Some discomfort ; and has an appt next week on 02/26/22 with PCP. Any questions or concerns? No  Items Reviewed: Did the pt receive and understand the discharge instructions provided? Yes  Medications obtained and verified? No  Other? No  Any new allergies since your discharge? No  Dietary orders reviewed? Yes Do you have support at home? Yes   Home Care and Equipment/Supplies: Were home health services ordered? no  Functional Questionnaire: (I = Independent and D = Dependent) ADLs: I  Bathing/Dressing- I  Meal Prep- I  Eating- I  Maintaining continence- I  Transferring/Ambulation- I  Managing Meds- I  Follow up appointments reviewed:  PCP Hospital f/u appt confirmed? Yes  Scheduled to see PCP on 02/26/22. [facility] f/u appt confirmed? No  Are transportation arrangements needed? No  If their condition worsens, is the pt aware to call PCP or go to the Emergency Dept.? Yes Was the patient provided with contact information for the PCP's office or ED? Yes Was to pt encouraged to call back with questions or concerns? Yes

## 2022-02-26 ENCOUNTER — Other Ambulatory Visit (HOSPITAL_BASED_OUTPATIENT_CLINIC_OR_DEPARTMENT_OTHER): Payer: Self-pay

## 2022-02-26 ENCOUNTER — Ambulatory Visit (INDEPENDENT_AMBULATORY_CARE_PROVIDER_SITE_OTHER): Payer: No Typology Code available for payment source | Admitting: Family Medicine

## 2022-02-26 ENCOUNTER — Encounter: Payer: Self-pay | Admitting: Family Medicine

## 2022-02-26 DIAGNOSIS — R0789 Other chest pain: Secondary | ICD-10-CM

## 2022-02-26 MED ORDER — METAXALONE 800 MG PO TABS
800.0000 mg | ORAL_TABLET | Freq: Three times a day (TID) | ORAL | 1 refills | Status: DC | PRN
  Filled 2022-02-26: qty 30, 10d supply, fill #0
  Filled 2022-04-05 – 2022-04-08 (×2): qty 30, 10d supply, fill #1

## 2022-02-26 NOTE — Progress Notes (Signed)
Joshua Brady - 46 y.o. male MRN [id number]  Date of birth: Jul 26, 1975  Subjective No chief complaint on file.   HPI Joshua Brady is a 46 year old male here today for hospital follow-up.  Recently seen in the ED at [facility] due to chest pain.  Normal EKG and cardiac enzymes.  Chest x-ray without any new or significant findings.  He has had recent consultation by cardiothoracic surgeon and cardiologist and pain thought to be musculoskeletal.  Today reports the pain has improved some but still feels tightness in his chest with movement of the arm.  He does have some pain into his shoulder blades as well.  Denies radiation to the arms, numbness, tingling or shortness of breath.  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   Low testosterone 04/29/2018   Lumbar degenerative disc disease 02/06/2020   Palpitations 03/03/2018   Sleep apnea 03/11/2018   SOB (shortness of breath) on exertion 03/03/2018   Viral illness 09/11/2019    Past Surgical History:  Procedure Laterality Date   Broken Arm     LEFT HEART CATH AND CORONARY ANGIOGRAPHY N/A 11/22/2020   Procedure: LEFT HEART CATH AND CORONARY ANGIOGRAPHY;  Surgeon: Troy Sine, MD;  Location: [place] [facility];  Service: Cardiovascular;  Laterality: N/A;   LIVER BIOPSY      Social History   Socioeconomic History   Marital status: Married    Spouse name: Bearett Porcaro   Number of children: 2   Years of education: Not on file   Highest  education level: Not on file  Occupational History   Not on file  Tobacco Use   Smoking status: Never    Passive exposure: Never   Smokeless tobacco: Never  Vaping Use   Vaping Use: Never used  Substance and Sexual Activity   Alcohol use: Yes    Comment: social   Drug use: No   Sexual activity: Yes    Birth control/protection: None  Other Topics Concern   Not on file  Social History Narrative   Not on file   Social Determinants of Health   Financial Resource Strain: Not on file  Food Insecurity: Not on file  Transportation Needs: Not on file  Physical Activity: Not on file  Stress: Not on file  Social Connections: Not on file    Family History  Problem Relation Age of Onset   Alcohol abuse Father    Myelodysplastic syndrome Father    Other Neg Hx        low testosterone    Health Maintenance  Topic Date Due   COVID-19 Vaccine (1) Never done   INFLUENZA VACCINE  01/13/2022   COLONOSCOPY (Pts 45-67yr Insurance coverage will need to be confirmed)  10/16/2022 (Originally 08/28/2018)   Hepatitis C Screening  10/16/2022 (Originally 08/28/1991)   TETANUS/TDAP  10/31/2025   HIV Screening  Completed   HPV VACCINES  Aged Out     -----------------------------------------------------------------------------------------------------------------------------------------------------------------------------------------------------------------  Physical Exam There were no vitals taken for this visit.  Physical Exam Constitutional:      Appearance: Normal appearance.  Eyes:     General: No scleral icterus. Cardiovascular:     Rate and Rhythm: Normal rate and regular rhythm.  Pulmonary:     Effort: Pulmonary effort is normal.     Breath sounds: Normal breath sounds.  Chest:     Chest wall: Tenderness (Tenderness to palpation along the anterior chest wall.) present.  Musculoskeletal:     Cervical back: Neck supple.  Neurological:     Mental Status: He is alert.   Psychiatric:        Mood and Affect: Mood normal.        Behavior: Behavior normal.     ------------------------------------------------------------------------------------------------------------------------------------------------------------------------------------------------------------------- Assessment and Plan  Chest wall pain Pain in the chest wall seems to be musculoskeletal.  Negative cardiac work-up.  Recommend use of over-the-counter muscle rub and will add Skelaxin.  Contact clinic if worsening.   Meds ordered this encounter  Medications   metaxalone (SKELAXIN) 800 MG tablet    Sig: Take 1 tablet (800 mg total) by mouth 3 (three) times daily as needed for muscle spasms.    Dispense:  30 tablet    Refill:  1    No follow-ups on file.    This visit occurred during the SARS-CoV-2 public health emergency.  Safety protocols were in place, including screening questions prior to the visit, additional usage of staff PPE, and extensive cleaning of exam room while observing appropriate contact time as indicated for disinfecting solutions.

## 2022-02-26 NOTE — Patient Instructions (Signed)
Try muscle rub on chest wall (biofreeze, icy hot, rock sauce) Try skelaxin.

## 2022-02-26 NOTE — Assessment & Plan Note (Signed)
Pain in the chest wall seems to be musculoskeletal.  Negative cardiac work-up.  Recommend use of over-the-counter muscle rub and will add Skelaxin.  Contact clinic if worsening.

## 2022-03-20 ENCOUNTER — Other Ambulatory Visit (HOSPITAL_BASED_OUTPATIENT_CLINIC_OR_DEPARTMENT_OTHER): Payer: Self-pay

## 2022-03-21 LAB — ALT: ALT: 54 IU/L — ABNORMAL HIGH (ref 0–44)

## 2022-03-21 LAB — LIPID PANEL
Chol/HDL Ratio: 4.7 ratio (ref 0.0–5.0)
Cholesterol, Total: 175 mg/dL (ref 100–199)
HDL: 37 mg/dL — ABNORMAL LOW (ref >39)
LDL Chol Calc (NIH): 105 mg/dL — ABNORMAL HIGH (ref 0–99)
Triglycerides: 190 mg/dL — ABNORMAL HIGH (ref 0–149)
VLDL Cholesterol Cal: 33 mg/dL (ref 5–40)

## 2022-03-21 LAB — AST: AST: 50 IU/L — ABNORMAL HIGH (ref 0–40)

## 2022-04-03 ENCOUNTER — Other Ambulatory Visit (HOSPITAL_BASED_OUTPATIENT_CLINIC_OR_DEPARTMENT_OTHER): Payer: Self-pay

## 2022-04-05 ENCOUNTER — Other Ambulatory Visit: Payer: Self-pay | Admitting: Family Medicine

## 2022-04-06 ENCOUNTER — Telehealth: Payer: Self-pay

## 2022-04-06 ENCOUNTER — Other Ambulatory Visit (HOSPITAL_COMMUNITY): Payer: Self-pay

## 2022-04-06 NOTE — Telephone Encounter (Signed)
-----   Message from Park Liter, MD sent at 03/24/2022 10:01 AM EDT ----- AST LT is stable and mildly elevated, continue present management

## 2022-04-06 NOTE — Telephone Encounter (Signed)
Patent notified via mychart.

## 2022-04-07 ENCOUNTER — Other Ambulatory Visit (HOSPITAL_COMMUNITY): Payer: Self-pay

## 2022-04-07 MED ORDER — METOCLOPRAMIDE HCL 5 MG PO TABS
5.0000 mg | ORAL_TABLET | Freq: Three times a day (TID) | ORAL | 1 refills | Status: DC | PRN
  Filled 2022-04-07 – 2022-04-08 (×2): qty 60, 10d supply, fill #0
  Filled 2022-06-25: qty 60, 10d supply, fill #1

## 2022-04-08 ENCOUNTER — Other Ambulatory Visit (HOSPITAL_COMMUNITY): Payer: Self-pay

## 2022-04-08 ENCOUNTER — Encounter (HOSPITAL_COMMUNITY): Payer: Self-pay | Admitting: Pharmacist

## 2022-04-08 ENCOUNTER — Other Ambulatory Visit (HOSPITAL_BASED_OUTPATIENT_CLINIC_OR_DEPARTMENT_OTHER): Payer: Self-pay

## 2022-04-17 ENCOUNTER — Other Ambulatory Visit (HOSPITAL_COMMUNITY): Payer: Self-pay

## 2022-04-23 ENCOUNTER — Other Ambulatory Visit (HOSPITAL_COMMUNITY): Payer: Self-pay

## 2022-04-24 ENCOUNTER — Other Ambulatory Visit (HOSPITAL_COMMUNITY): Payer: Self-pay

## 2022-05-01 ENCOUNTER — Other Ambulatory Visit (HOSPITAL_BASED_OUTPATIENT_CLINIC_OR_DEPARTMENT_OTHER): Payer: Self-pay

## 2022-05-13 ENCOUNTER — Other Ambulatory Visit (HOSPITAL_COMMUNITY): Payer: Self-pay

## 2022-05-15 ENCOUNTER — Other Ambulatory Visit (HOSPITAL_COMMUNITY): Payer: Self-pay

## 2022-05-15 ENCOUNTER — Other Ambulatory Visit (HOSPITAL_BASED_OUTPATIENT_CLINIC_OR_DEPARTMENT_OTHER): Payer: Self-pay

## 2022-05-25 ENCOUNTER — Other Ambulatory Visit: Payer: Self-pay | Admitting: Thoracic Surgery (Cardiothoracic Vascular Surgery)

## 2022-05-25 DIAGNOSIS — J9859 Other diseases of mediastinum, not elsewhere classified: Secondary | ICD-10-CM

## 2022-05-26 ENCOUNTER — Other Ambulatory Visit (HOSPITAL_BASED_OUTPATIENT_CLINIC_OR_DEPARTMENT_OTHER): Payer: Self-pay

## 2022-05-26 ENCOUNTER — Other Ambulatory Visit: Payer: Self-pay | Admitting: Family Medicine

## 2022-05-26 ENCOUNTER — Ambulatory Visit
Admission: RE | Admit: 2022-05-26 | Discharge: 2022-05-26 | Disposition: A | Discharge status: Home / Self Care | Payer: No Typology Code available for payment source | Source: Ambulatory Visit | Attending: Thoracic Surgery (Cardiothoracic Vascular Surgery) | Admitting: Thoracic Surgery (Cardiothoracic Vascular Surgery)

## 2022-05-26 ENCOUNTER — Encounter: Payer: Self-pay | Admitting: Thoracic Surgery (Cardiothoracic Vascular Surgery)

## 2022-05-26 ENCOUNTER — Ambulatory Visit: Payer: No Typology Code available for payment source | Admitting: Thoracic Surgery (Cardiothoracic Vascular Surgery)

## 2022-05-26 VITALS — BP 160/90 | HR 76 | Resp 20 | Ht 72.0 in | Wt 292.0 lb

## 2022-05-26 DIAGNOSIS — J9859 Other diseases of mediastinum, not elsewhere classified: Secondary | ICD-10-CM | POA: Diagnosis not present

## 2022-05-26 MED ORDER — METAXALONE 800 MG PO TABS
800.0000 mg | ORAL_TABLET | Freq: Three times a day (TID) | ORAL | 1 refills | Status: DC | PRN
  Filled 2022-05-26: qty 30, 10d supply, fill #0
  Filled 2022-06-25: qty 30, 10d supply, fill #1

## 2022-05-26 NOTE — Progress Notes (Signed)
[address]       [place] [address]             [phone]     HPI: Mr. Durnin returns for a scheduled follow-up visit  Joshua Brady is a 46 year old man with a history of CAD, dyspnea, hypertension, hyperlipidemia, obesity, OSA, heart murmur, closed head injury, and a mediastinal cyst.  I did a robotic resection of the mediastinal cyst in January 2013.  It was wrapped around the phrenic nerve.  Postoperatively he had an elevated left hemidiaphragm.  I last saw him in August.  He had returned to work.  Was still having issues with tiring easily and getting out of breath quickly.  In the interim since that visit he is seeing further improvement.  He still is not ready to return to full activities at his job but continues to work on [employer] duty.  He has seen improvement with his respiratory capacity and exercise capacity.  Still not back to baseline yet.  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 90 tablet 3   carvedilol (COREG) 6.25 MG tablet Take 1 tablet (6.25 mg total) by mouth 2 (two) times daily with a meal. 180 tablet 2   metaxalone (SKELAXIN) 800 MG tablet Take 1 tablet (800 mg total) by mouth 3 (three)  times daily as needed for muscle spasms. 30 tablet 1   metoCLOPramide (REGLAN) 5 MG tablet Take 1 - 2 tablets (5-10 mg total) by mouth every 8 (eight) hours as needed for nausea. 60 tablet 1   pantoprazole (PROTONIX) 40 MG tablet Take 1 tablet (40 mg total) by mouth daily. 30 tablet 3   aspirin EC 81 MG tablet Take 1 tablet (81 mg total) by mouth daily. Swallow whole. 90 tablet 3   Coenzyme Q10 (CO Q-10 PO) Take 1 tablet by mouth daily at 12 noon. Unknown strenght     ibuprofen (ADVIL) 800 MG tablet Take 1 tablet by mouth every 8 hours as needed (Patient taking differently: Take 800 mg by mouth every 8 (eight) hours as needed for mild pain or moderate pain.) 21 tablet 0   OVER THE COUNTER MEDICATION Take 1 tablet by mouth daily at 12 noon. Beet root/Unknown strength     No current facility-administered medications for this visit.    Physical Exam BP (!) 160/90 (BP Location: Left Arm, Patient Position: Sitting)   Pulse 76   Resp 20   Ht 6' (1.829 m)   Wt 292 lb (132.5 kg)   SpO2 95% Comment: Ra  BMI 39.60 kg/m  Obese 46 year old man in no acute distress Alert and oriented x 3 with no focal  deficits Lungs diminished at left base, otherwise clear Cardiac regular rate and rhythm  Diagnostic Tests: CHEST - 2 VIEW   COMPARISON:  February 19, 2022.   FINDINGS: The heart size and mediastinal contours are within normal limits. Right lung is clear. Stable elevated left hemidiaphragm is noted with minimal left basilar subsegmental atelectasis. The visualized skeletal structures are unremarkable.   IMPRESSION: Stable elevated left hemidiaphragm with minimal left basilar subsegmental atelectasis.     Electronically Signed   By: Marijo Conception M.D.   On: 05/26/2022 10:06 I personally reviewed the chest x-ray.  The left hemidiaphragm remains elevated but think there has been some improvement.  When measured against the right its 5 cm above the right where is on the last x-ray it was 7  cm above the right.  Obviously depth of inspiration and other issues could contribute to that but it appears to be a relatively similar degree of inspiration.  Impression: Joshua Brady is a 46 year old man with a history of CAD, dyspnea, hypertension, hyperlipidemia, obesity, OSA, heart murmur, closed head injury, and a mediastinal cyst.  Mediastinal cyst-now almost a year out from surgery.  It was a benign cyst per pathology.  No need for follow-up.  Elevated left hemidiaphragm-he has had significant improvement clinically.  Still not back to baseline but is happy with his progress.  I also think there has been some improvement in the diaphragm excursion on the chest x-ray.  We could do a sniff test but with him improving clinically, there is not really any reason to intervene.  Instead we will plan to see him back in 6 months with another chest x-ray.  Plan: Continue light duty at work Return in 6 months with PA and lateral chest x-ray  [name], MD [facility] [phone]

## 2022-06-07 ENCOUNTER — Emergency Department (HOSPITAL_BASED_OUTPATIENT_CLINIC_OR_DEPARTMENT_OTHER)
Admission: EM | Admit: 2022-06-07 | Discharge: 2022-06-07 | Disposition: A | Discharge status: Home / Self Care | Payer: No Typology Code available for payment source | Attending: Emergency Medicine | Admitting: Emergency Medicine

## 2022-06-07 ENCOUNTER — Emergency Department (HOSPITAL_BASED_OUTPATIENT_CLINIC_OR_DEPARTMENT_OTHER): Payer: No Typology Code available for payment source

## 2022-06-07 ENCOUNTER — Encounter (HOSPITAL_BASED_OUTPATIENT_CLINIC_OR_DEPARTMENT_OTHER): Payer: Self-pay | Admitting: Emergency Medicine

## 2022-06-07 ENCOUNTER — Other Ambulatory Visit: Payer: Self-pay

## 2022-06-07 DIAGNOSIS — Z79899 Other long term (current) drug therapy: Secondary | ICD-10-CM | POA: Diagnosis not present

## 2022-06-07 DIAGNOSIS — M545 Low back pain, unspecified: Secondary | ICD-10-CM | POA: Insufficient documentation

## 2022-06-07 DIAGNOSIS — G4733 Obstructive sleep apnea (adult) (pediatric): Secondary | ICD-10-CM | POA: Insufficient documentation

## 2022-06-07 DIAGNOSIS — I10 Essential (primary) hypertension: Secondary | ICD-10-CM | POA: Insufficient documentation

## 2022-06-07 DIAGNOSIS — I2510 Atherosclerotic heart disease of native coronary artery without angina pectoris: Secondary | ICD-10-CM | POA: Diagnosis not present

## 2022-06-07 LAB — CBC
HCT: 40.8 % (ref 39.0–52.0)
Hemoglobin: 13.3 g/dL (ref 13.0–17.0)
MCH: 27.7 pg (ref 26.0–34.0)
MCHC: 32.6 g/dL (ref 30.0–36.0)
MCV: 85 fL (ref 80.0–100.0)
Platelets: 274 10*3/uL (ref 150–400)
RBC: 4.8 MIL/uL (ref 4.22–5.81)
RDW: 12.8 % (ref 11.5–15.5)
WBC: 9.7 10*3/uL (ref 4.0–10.5)
nRBC: 0 % (ref 0.0–0.2)

## 2022-06-07 LAB — COMPREHENSIVE METABOLIC PANEL
ALT: 50 U/L — ABNORMAL HIGH (ref 0–44)
AST: 43 U/L — ABNORMAL HIGH (ref 15–41)
Albumin: 3.8 g/dL (ref 3.5–5.0)
Alkaline Phosphatase: 91 U/L (ref 38–126)
Anion gap: 5 (ref 5–15)
BUN: 16 mg/dL (ref 6–20)
CO2: 25 mmol/L (ref 22–32)
Calcium: 9 mg/dL (ref 8.9–10.3)
Chloride: 105 mmol/L (ref 98–111)
Creatinine, Ser: 0.96 mg/dL (ref 0.61–1.24)
GFR, Estimated: >60 mL/min (ref >60)
Glucose, Bld: 170 mg/dL — ABNORMAL HIGH (ref 70–99)
Potassium: 4.1 mmol/L (ref 3.5–5.1)
Sodium: 135 mmol/L (ref 135–145)
Total Bilirubin: 0.6 mg/dL (ref 0.3–1.2)
Total Protein: 7.6 g/dL (ref 6.5–8.1)

## 2022-06-07 LAB — TROPONIN I (HIGH SENSITIVITY)
Troponin I (High Sensitivity): 4 ng/L (ref <18)
Troponin I (High Sensitivity): 4 ng/L (ref <18)

## 2022-06-07 MED ORDER — FENTANYL CITRATE PF 50 MCG/ML IJ SOSY
12.5000 ug | PREFILLED_SYRINGE | Freq: Once | INTRAMUSCULAR | Status: AC
  Administered 2022-06-07: 12.5 ug via INTRAVENOUS
  Filled 2022-06-07: qty 1

## 2022-06-07 MED ORDER — IOHEXOL 350 MG/ML SOLN
100.0000 mL | Freq: Once | INTRAVENOUS | Status: AC | PRN
  Administered 2022-06-07: 100 mL via INTRAVENOUS

## 2022-06-07 NOTE — Discharge Instructions (Addendum)
Note the workup today was overall reassuring.  As discussed, continue take muscle relaxer as well as ibuprofen/Tylenol as needed for pain.  Recommend follow-up with primary care in 7 to 10 days for reevaluation.  Please do not hesitate to return to emergency department if the worrisome signs and symptoms we discussed become apparent.

## 2022-06-07 NOTE — ED Provider Notes (Signed)
[facility] EMERGENCY DEPARTMENT Provider Note   CSN: [id number] Arrival date & time: 06/07/22  1148     History  Chief Complaint  Patient presents with   Chest Pain    Joshua Brady is a 46 y.o. male.   Chest Pain   46 year old male presents to the ED with complaints of left lower back pain.  Patient notes acute onset earlier this morning when he was working.  Describes pain as "tearing type sensation."  Denies history of similar symptoms.  Reports pain worsening with positional changes as well as with movement of his back.  Upon coming into the emergency department noted left-sided chest pain that was experienced when EKG leads were being placed on chest.  Reports some associated nausea of which she has been experiencing since last night secondary to gastroparesis; he states that he experiences this sensation daily but again felt it with increasing back pain this morning.  Reports some associated shortness of breath.  Denies fever, cough, congestion, abdominal pain, vomiting, urinary symptoms, change in bowel habits.  Past medical history significant for hypertension, hyperglycemia, chronic low back pain, hyperlipidemia, OSA, coronary artery disease with 20% of LAD from 2022, mediastinal mass, pericardial mass  Home Medications Prior to Admission medications   Medication Sig Start Date End Date Taking? Authorizing Provider  aspirin EC 81 MG tablet Take 1 tablet (81 mg total) by mouth daily. Swallow whole. 10/29/20   Park Liter, MD  atorvastatin (LIPITOR) 80 MG tablet Take 1 tablet (80 mg total) by mouth daily. 02/04/22   Park Liter, MD  carvedilol (COREG) 6.25 MG tablet Take 1 tablet (6.25 mg total) by mouth 2 (two) times daily with a meal. 09/02/21   Park Liter, MD  Coenzyme Q10 (CO Q-10 PO) Take 1 tablet by mouth daily at 12 noon. Unknown strenght    [provider]  ibuprofen (ADVIL) 800 MG tablet Take 1 tablet by mouth every 8 hours as  needed Patient taking differently: Take 800 mg by mouth every 8 (eight) hours as needed for mild pain or moderate pain. 10/07/21     metaxalone (SKELAXIN) 800 MG tablet Take 1 tablet (800 mg total) by mouth 3 (three) times daily as needed for muscle spasms. 05/26/22   Luetta Nutting, DO  metoCLOPramide (REGLAN) 5 MG tablet Take 1 - 2 tablets (5-10 mg total) by mouth every 8 (eight) hours as needed for nausea. 04/07/22   Luetta Nutting, DO  OVER THE COUNTER MEDICATION Take 1 tablet by mouth daily at 12 noon. Beet root/Unknown strength    [provider]  pantoprazole (PROTONIX) 40 MG tablet Take 1 tablet (40 mg total) by mouth daily. 02/20/22   Luetta Nutting, DO      Allergies    Other, Prednisone, and Topiramate    Review of Systems   Review of Systems  Cardiovascular:  Positive for chest pain.  All other systems reviewed and are negative.   Physical Exam Updated Vital Signs BP (!) 146/79   Pulse 76   Temp 99.3 F (37.4 C) (Oral)   Resp 17   Ht 6' (1.829 m)   Wt 132.5 kg   SpO2 98%   BMI 39.60 kg/m  Physical Exam Vitals and nursing note reviewed.  Constitutional:      General: He is not in acute distress.    Appearance: He is well-developed.  HENT:     Head: Normocephalic and atraumatic.  Eyes:     Conjunctiva/sclera:  Conjunctivae normal.  Cardiovascular:     Rate and Rhythm: Normal rate and regular rhythm.     Heart sounds: No murmur heard.    Comments: Pulses symmetric radially as well as posterior tibial Pulmonary:     Effort: Pulmonary effort is normal. No respiratory distress.     Breath sounds: Normal breath sounds.     Comments: Patient with left-sided chest tenderness to palpation. Abdominal:     Palpations: Abdomen is soft.     Tenderness: There is no abdominal tenderness.  Musculoskeletal:        General: No swelling.     Cervical back: Neck supple.     Right lower leg: No edema.     Left lower leg: No edema.     Comments: No midline tenderness  in the lumbar region.  Paraspinal tenderness left-sided in the lumbar region.  Pain is worsened with flexion as well as horizontal rotation of spine.  Strength symmetric bilateral lower extremities and knee flexion, extension, hip flexion and extension.  No sensory deficits along major nerve each patient's lower extremities.  DTR symmetric and equal.  Pedal pulses full intact bilaterally.  Skin:    General: Skin is warm and dry.     Capillary Refill: Capillary refill takes less than 2 seconds.  Neurological:     Mental Status: He is alert.  Psychiatric:        Mood and Affect: Mood normal.     ED Results / Procedures / Treatments   Labs (all labs ordered are listed, but only abnormal results are displayed) Labs Reviewed  COMPREHENSIVE METABOLIC PANEL - Abnormal; Notable for the following components:      Result Value   Glucose, Bld 170 (*)    AST 43 (*)    ALT 50 (*)    All other components within normal limits  CBC  TROPONIN I (HIGH SENSITIVITY)  TROPONIN I (HIGH SENSITIVITY)    EKG EKG Interpretation  Date/Time:  Sunday June 07 2022 12:01:10 EST Ventricular Rate:  85 PR Interval:  154 QRS Duration: 88 QT Interval:  372 QTC Calculation: 442 R Axis:   50 Text Interpretation: Normal sinus rhythm Normal ECG When compared with ECG of 19-Feb-2022 18:56, PREVIOUS ECG IS PRESENT NO SIGNIFICANT CHANGE SINCE LAST TRACING YESTERDAY Confirmed by Fredia Sorrow [phone]) on 06/07/2022 12:11:02 PM  Radiology CT Angio Chest/Abd/Pel for Dissection W and/or W/WO  Result Date: 06/07/2022 CLINICAL DATA:  Thoracoabdominal aortic dissection, follow up. Patient had a cystic mediastinal mass removed in January of 2023 consistent with a pericardial cyst. EXAM: CT ANGIOGRAPHY CHEST, ABDOMEN AND PELVIS TECHNIQUE: Non-contrast CT of the chest was initially obtained. Multidetector CT imaging through the chest, abdomen and pelvis was performed using the standard protocol during bolus  administration of intravenous contrast. Multiplanar reconstructed images and MIPs were obtained and reviewed to evaluate the vascular anatomy. RADIATION DOSE REDUCTION: This exam was performed according to the departmental dose-optimization program which includes automated exposure control, adjustment of the mA and/or kV according to patient size and/or use of iterative reconstruction technique. CONTRAST:  139m OMNIPAQUE IOHEXOL 350 MG/ML SOLN COMPARISON:  June 15, 2021 FINDINGS: CTA CHEST FINDINGS Cardiovascular: Preferential opacification of the thoracic aorta. No evidence of thoracic aortic aneurysm or dissection. Normal heart size. No pericardial effusion. Previously described cystic mass is no longer visualized consistent with interval resection. No intramural hematoma of the visualized thoracic aorta. Limited assessment of the aortic root secondary to cardiac motion. Mediastinum/Nodes: No axillary or mediastinal adenopathy.  Visualized thyroid is unremarkable. Lungs/Pleura: Elevation of LEFT hemidiaphragm. LEFT basilar homogeneous enhancing opacity, consistent with atelectasis. No pleural effusion or pneumothorax. Musculoskeletal: No chest wall abnormality. No acute or significant osseous findings. Review of the MIP images confirms the above findings. CTA ABDOMEN AND PELVIS FINDINGS VASCULAR Aorta: Normal caliber aorta without aneurysm, dissection, vasculitis or significant stenosis. Minimal atherosclerotic calcifications. Celiac: Patent without evidence of aneurysm, dissection, vasculitis or significant stenosis. SMA: Patent without evidence of aneurysm, dissection, vasculitis or significant stenosis. Renals: Both renal arteries are patent without evidence of aneurysm, dissection, vasculitis, fibromuscular dysplasia or significant stenosis. Two LEFT-sided renal arteries and 2 RIGHT-sided renal arteries. IMA: Patent without evidence of aneurysm, dissection, vasculitis or significant stenosis. Inflow: Patent  without evidence of aneurysm, dissection, vasculitis or significant stenosis. Minimal atherosclerotic calcifications Veins: No obvious venous abnormality within the limitations of this arterial phase study. Review of the MIP images confirms the above findings. NON-VASCULAR Hepatobiliary: Hepatic steatosis.  Cholelithiasis. Pancreas: Unremarkable. No pancreatic ductal dilatation or surrounding inflammatory changes. Spleen: Normal in size without focal abnormality. Adrenals/Urinary Tract: Adrenal glands are unremarkable. Kidneys are normal, without renal calculi, focal lesion, or hydronephrosis. Bladder is unremarkable. Stomach/Bowel: No evidence of bowel obstruction. Moderate colonic stool burden throughout the colon. No evidence of acute appendicitis. Stomach is unremarkable. Lymphatic: No suspicious lymphadenopathy. Reproductive: Prostate is unremarkable. Other: No free air or free fluid. Musculoskeletal: No acute or significant osseous findings. Review of the MIP images confirms the above findings. IMPRESSION: 1. No evidence of acute aortic syndrome. 2. Interval resection of benign cystic mediastinal mass. 3. Hepatic steatosis. Electronically Signed   By: Valentino Saxon M.D.   On: 06/07/2022 13:56   DG Chest 2 View  Result Date: 06/07/2022 CLINICAL DATA:  chest pain EXAM: CHEST - 2 VIEW COMPARISON:  May 26, 2022. FINDINGS: The cardiomediastinal silhouette is unchanged in contour.Unchanged elevation of LEFT hemidiaphragm. No pleural effusion. No pneumothorax. Similar appearance of a LEFT basilar platelike opacity, likely atelectasis. Visualized abdomen is unremarkable. IMPRESSION: Similar appearance of favored LEFT basilar atelectasis. Electronically Signed   By: Valentino Saxon M.D.   On: 06/07/2022 13:26    Procedures Procedures    Medications Ordered in ED Medications  iohexol (OMNIPAQUE) 350 MG/ML injection 100 mL (100 mLs Intravenous Contrast Given 06/07/22 1325)  fentaNYL (SUBLIMAZE)  injection 12.5 mcg (12.5 mcg Intravenous Given 06/07/22 1353)    ED Course/ Medical Decision Making/ A&P                           Medical Decision Making Amount and/or Complexity of Data Reviewed Labs: ordered. Radiology: ordered.   This patient presents to the ED for concern of chest pain/back pain, this involves an extensive number of treatment options, and is a complaint that carries with it a high risk of complications and morbidity.  The differential diagnosis includes aortic dissection, aortic aneurysm, ACS, pulmonary embolus, cauda equina, lumbar strain, fracture, strain chest pain, dislocation, hematoma, pneumonia, pneumothorax  Co morbidities that complicate the patient evaluation  See HPI   Additional history obtained:  Additional history obtained from EMR External records from outside source obtained and reviewed including hospital records   Lab Tests:  I Ordered, and personally interpreted labs.  The pertinent results include: No leukocytosis.  No evidence anemia.  Platelets within range.  No electrolyte abnormalities appreciated.  Mild nonspecific transaminitis.  Renal function within normal limits.  Initial troponin of 4 with repeat4; EKG concerning for sinus rhythm without  acute ischemic changes; doubt ACS   Imaging Studies ordered:  I ordered imaging studies including chest x-ray, CT angio chest abdomen pelvis I independently visualized and interpreted imaging which showed  Chest x-ray: No acute cardiopulmonary abnormalities.  Similar appearing left-sided basilar atelectasis. CT angio chest abdomen pelvis: No evidence of acute aortic syndrome.  Interval resection of benign cystic mediastinal mass.  Hepatic steatosis. I agree with the radiologist interpretation  Cardiac Monitoring: / EKG:  The patient was maintained on a cardiac monitor.  I personally viewed and interpreted the cardiac monitored which showed an underlying rhythm of: Sinus rhythm without acute  ischemic changes.   Consultations Obtained:  N/a   Problem List / ED Course / Critical interventions / Medication management  Low back pain I ordered medication including fentanyl for pain   Reevaluation of the patient after these medicines showed that the patient resolved I have reviewed the patients home medicines and have made adjustments as needed   Social Determinants of Health:  Denies tobacco, illicit drug use   Test / Admission - Considered:  Low back pain Vitals signs significant for mild hypertension with a blood pressure 146/79.  Recommend follow-up with primary care regarding elevation blood pressure.. Otherwise within normal range and stable throughout visit. Laboratory/imaging studies significant for: See above Patient with overall reassuring workup.  Pain seem most likely musculoskeletal given area of tenderness as well as history of symptom onset with movement.  Given description of pain as "tearing sensation", further examination very dissection study was performed of which is negative.  Patient has no red flag signs of back pain so doubt cauda equina/spinal epidural abscess.  Patient PERC negative so doubt PE.  Patient responded well to pain medication given while in the emergency department.  Recommended outpatient therapy with Skelaxin as provided by his primary care as well as Motrin/Tylenol.  Recommend follow-up with primary care in 5 to 7 days for reevaluation.  Treatment plan discussed at length with patient and he acknowledged understanding was agreeable to said plan. Worrisome signs and symptoms were discussed with the patient, and the patient acknowledged understanding to return to the ED if noticed. Patient was stable upon discharge.          Final Clinical Impression(s) / ED Diagnoses Final diagnoses:  Acute left-sided low back pain without sciatica    Rx / DC Orders ED Discharge Orders     None         Wilnette Kales, [place] 06/07/22  1530    Fredia Sorrow, MD 06/08/22 1714

## 2022-06-07 NOTE — ED Triage Notes (Signed)
Pt c/o chest pain tearing sensation x 1 hour. Pt has had nausea all night. Pt denies sweating, does have fatigue. Patient also reports back pain.

## 2022-06-07 NOTE — ED Notes (Signed)
Patient @ [facility]

## 2022-06-10 ENCOUNTER — Telehealth: Payer: Self-pay | Admitting: General Practice

## 2022-06-10 NOTE — Telephone Encounter (Signed)
Transition Care Management Follow-up Telephone Call Date of discharge and from where: 06/07/22 from [facility] How have you been since you were released from the hospital? Patient stated that he is doing a little better.  Any questions or concerns? No  Items Reviewed: Did the pt receive and understand the discharge instructions provided? Yes  Medications obtained and verified? Yes  Other? No  Any new allergies since your discharge? No  Dietary orders reviewed? Yes Do you have support at home? Yes   Home Care and Equipment/Supplies: Were home health services ordered? no  Functional Questionnaire: (I = Independent and D = Dependent) ADLs: I  Bathing/Dressing- I  Meal Prep- I  Eating- I  Maintaining continence- I  Transferring/Ambulation- I  Managing Meds- I  Follow up appointments reviewed:  PCP Hospital f/u appt confirmed? Yes  Scheduled to see Dr. Zigmund Daniel on 06/26/22 @ 1110. [facility] f/u appt confirmed? No   Are transportation arrangements needed? No  If their condition worsens, is the pt aware to call PCP or go to the Emergency Dept.? Yes Was the patient provided with contact information for the PCP's office or ED? Yes Was to pt encouraged to call back with questions or concerns? Yes

## 2022-06-12 ENCOUNTER — Other Ambulatory Visit (HOSPITAL_BASED_OUTPATIENT_CLINIC_OR_DEPARTMENT_OTHER): Payer: Self-pay

## 2022-06-25 ENCOUNTER — Other Ambulatory Visit: Payer: Self-pay

## 2022-06-25 ENCOUNTER — Other Ambulatory Visit (HOSPITAL_BASED_OUTPATIENT_CLINIC_OR_DEPARTMENT_OTHER): Payer: Self-pay

## 2022-06-25 ENCOUNTER — Other Ambulatory Visit: Payer: Self-pay | Admitting: Family Medicine

## 2022-06-25 MED ORDER — PANTOPRAZOLE SODIUM 40 MG PO TBEC
40.0000 mg | DELAYED_RELEASE_TABLET | Freq: Every day | ORAL | 3 refills | Status: DC
  Filled 2022-06-25: qty 30, 30d supply, fill #0

## 2022-06-26 ENCOUNTER — Encounter: Payer: Self-pay | Admitting: Family Medicine

## 2022-06-26 ENCOUNTER — Ambulatory Visit (INDEPENDENT_AMBULATORY_CARE_PROVIDER_SITE_OTHER): Payer: No Typology Code available for payment source | Admitting: Family Medicine

## 2022-06-26 ENCOUNTER — Other Ambulatory Visit (HOSPITAL_BASED_OUTPATIENT_CLINIC_OR_DEPARTMENT_OTHER): Payer: Self-pay

## 2022-06-26 VITALS — BP 127/85 | HR 76 | Ht 72.0 in | Wt 289.0 lb

## 2022-06-26 DIAGNOSIS — M545 Low back pain, unspecified: Secondary | ICD-10-CM

## 2022-06-26 DIAGNOSIS — E119 Type 2 diabetes mellitus without complications: Secondary | ICD-10-CM

## 2022-06-26 LAB — POCT GLYCOSYLATED HEMOGLOBIN (HGB A1C): HbA1c, POC (controlled diabetic range): 7.4 % — AB (ref 0.0–7.0)

## 2022-06-26 MED ORDER — FREESTYLE LIBRE 3 SENSOR MISC
4 refills | Status: DC
  Filled 2022-06-26 – 2022-09-22 (×4): qty 2, 28d supply, fill #0
  Filled 2022-10-16: qty 2, 28d supply, fill #1
  Filled 2022-11-12: qty 2, 28d supply, fill #2
  Filled 2022-12-08: qty 2, 28d supply, fill #3
  Filled 2022-12-25: qty 2, 28d supply, fill #4

## 2022-06-26 MED ORDER — OZEMPIC (0.25 OR 0.5 MG/DOSE) 2 MG/3ML ~~LOC~~ SOPN
0.5000 mg | PEN_INJECTOR | SUBCUTANEOUS | 1 refills | Status: DC
  Filled 2022-06-26 – 2022-09-22 (×4): qty 9, 84d supply, fill #0

## 2022-06-26 MED ORDER — MELOXICAM 15 MG PO TABS
15.0000 mg | ORAL_TABLET | Freq: Every day | ORAL | 0 refills | Status: DC
  Filled 2022-06-26: qty 30, 30d supply, fill #0

## 2022-06-26 NOTE — Patient Instructions (Addendum)
Use meloxicam daily x2 weeks then as needed.  Try home exercises.  If not improving or worsening let me know.   Let's try Ozempic to help with blood sugar and weight management.  Follow up in 3 months.

## 2022-06-28 ENCOUNTER — Encounter: Payer: Self-pay | Admitting: Family Medicine

## 2022-06-28 DIAGNOSIS — E119 Type 2 diabetes mellitus without complications: Secondary | ICD-10-CM | POA: Insufficient documentation

## 2022-06-28 DIAGNOSIS — M545 Low back pain, unspecified: Secondary | ICD-10-CM | POA: Insufficient documentation

## 2022-06-28 NOTE — Assessment & Plan Note (Signed)
History of prediabetes that has progressed to diabetes at this point.  We discussed adding a GLP-1 Ozempic this to be helpful for blood sugar control and weight management.  Starting Ozempic 0.25 mg weekly x 4 weeks with increase to 0.5 mg weekly thereafter.  Encouraged to work on dietary changes as well as increasing activity.  Follow-up in 3 months.

## 2022-06-28 NOTE — Assessment & Plan Note (Signed)
Adding meloxicam daily x 2 weeks then as needed thereafter.  Printed handout for home exercise program.

## 2022-06-28 NOTE — Progress Notes (Signed)
Joshua Brady - 47 y.o. male MRN [id number]  Date of birth: [birth year]  Subjective Chief Complaint  Patient presents with   ER follow up     HPI Joshua Brady is a 47 year old male here today for follow-up of recent ER visit.  Seen in the ED with complaint of low back pain.  Described as sensation of tearing in his lower back.  Evaluated for possible aortic dissection/aneurysm in the ED.  CTA angiogram of the chest abdomen and pelvis was completed without any evidence of aortic syndrome.  He does continue to have some back pain but reports this seems to be improving some.  Denies radiation of pain, numbness or tingling.  No weakness in the lower extremities.  Denies urinary symptoms.  Bowels are moving normally.  He does have history of elevated blood sugars.  He did work on dietary changes for a while but has not been quite as disciplined recently.  A1c has increased today.  Denies any symptoms at this time.  ROS:  A comprehensive ROS was completed and negative except as noted per HPI  Allergies  Allergen Reactions   Other     Ants - see notes from ER visit 02/2018   Prednisone    Topiramate     Anxiety, dizzy, depressed, suicidal thoughts, agitated    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   Low testosterone 04/29/2018   Lumbar degenerative disc disease 02/06/2020   Palpitations 03/03/2018   Sleep apnea 03/11/2018   SOB (shortness of breath) on exertion 03/03/2018   Viral illness 09/11/2019    Past Surgical History:  Procedure Laterality Date   Broken Arm     LEFT HEART  CATH AND CORONARY ANGIOGRAPHY N/A 11/22/2020   Procedure: LEFT HEART CATH AND CORONARY ANGIOGRAPHY;  Surgeon: Troy Sine, MD;  Location: [facility];  Service: Cardiovascular;  Laterality: N/A;   LIVER BIOPSY      Social History   Socioeconomic History   Marital status: Married    Spouse name: Ether Wolters   Number of children: 2   Years of education: Not on file   Highest education level: Not on file  Occupational History   Not on file  Tobacco Use   Smoking status: Never    Passive exposure: Never   Smokeless tobacco: Never  Vaping Use   Vaping Use: Never used  Substance and Sexual Activity   Alcohol use: Yes    Comment: social   Drug use: No   Sexual activity: Yes    Birth control/protection: None  Other Topics Concern   Not on file  Social History Narrative   Not on file   Social Determinants of Health   Financial Resource Strain: Not on file  Food Insecurity: Not on file  Transportation Needs: Not on file  Physical Activity: Not on file  Stress: Not on file  Social Connections: Not on file    Family History  Problem Relation Age of Onset   Alcohol abuse Father    Myelodysplastic syndrome Father  Other Neg Hx        low testosterone    Health Maintenance  Topic Date Due   FOOT EXAM  Never done   OPHTHALMOLOGY EXAM  Never done   Diabetic kidney evaluation - Urine ACR  Never done   COVID-19 Vaccine (1) 07/12/2022 (Originally 02/27/1976)   INFLUENZA VACCINE  09/13/2022 (Originally 01/13/2022)   COLONOSCOPY (Pts 45-79yr Insurance coverage will need to be confirmed)  10/16/2022 (Originally 08/28/2018)   Hepatitis C Screening  10/16/2022 (Originally 08/28/1991)   HEMOGLOBIN A1C  12/25/2022   Diabetic kidney evaluation - eGFR measurement  06/08/2023   DTaP/Tdap/Td (2 - Td or Tdap) 10/31/2025   HIV Screening  Completed   HPV VACCINES  Aged Out      ----------------------------------------------------------------------------------------------------------------------------------------------------------------------------------------------------------------- Physical Exam BP 127/85 (BP Location: Left Arm, Patient Position: Sitting, Cuff Size: Large)   Pulse 76   Ht 6' (1.829 m)   Wt 289 lb (131.1 kg)   SpO2 94%   BMI 39.20 kg/m   Physical Exam Constitutional:      Appearance: Normal appearance.  HENT:     Head: Normocephalic and atraumatic.  Cardiovascular:     Rate and Rhythm: Normal rate and regular rhythm.  Pulmonary:     Effort: Pulmonary effort is normal.     Breath sounds: Normal breath sounds.  Musculoskeletal:     Comments: Range of motion of lumbar spine is normal in flexion with some pain.  Normal extension with some improvement in pain.  Strength in lower extremities is normal.  Neurological:     Mental Status: He is alert.  Psychiatric:        Mood and Affect: Mood normal.        Behavior: Behavior normal.     ------------------------------------------------------------------------------------------------------------------------------------------------------------------------------------------------------------------- Assessment and Plan  Type 2 diabetes mellitus without complication, without long-term current use of insulin (HCC) History of prediabetes that has progressed to diabetes at this point.  We discussed adding a GLP-1 Ozempic this to be helpful for blood sugar control and weight management.  Starting Ozempic 0.25 mg weekly x 4 weeks with increase to 0.5 mg weekly thereafter.  Encouraged to work on dietary changes as well as increasing activity.  Follow-up in 3 months.  Low back pain Adding meloxicam daily x 2 weeks then as needed thereafter.  Printed handout for home exercise program.   Meds ordered this encounter  Medications   meloxicam (MOBIC) 15 MG tablet    Sig: Take 1 tablet (15 mg  total) by mouth daily.    Dispense:  30 tablet    Refill:  0   Semaglutide,0.25 or 0.'5MG'$ /DOS, (OZEMPIC, 0.25 OR 0.5 MG/DOSE,) 2 MG/3ML SOPN    Sig: Inject 0.'25mg'$  weekly x4 weeks then increase to 0.'5mg'$  weekly.    Dispense:  9 mL    Refill:  1   Continuous Blood Gluc Sensor (FREESTYLE LIBRE 3 SENSOR) MISC    Sig: Place 1 sensor on the skin every 14 days. Use to check glucose continuously    Dispense:  2 each    Refill:  4    Return in about 3 months (around 09/25/2022) for F/u Blood sugars.    This visit occurred during the SARS-CoV-2 public health emergency.  Safety protocols were in place, including screening questions prior to the visit, additional usage of staff PPE, and extensive cleaning of exam room while observing appropriate contact time as indicated for disinfecting solutions.

## 2022-06-29 ENCOUNTER — Other Ambulatory Visit (HOSPITAL_BASED_OUTPATIENT_CLINIC_OR_DEPARTMENT_OTHER): Payer: Self-pay

## 2022-06-30 ENCOUNTER — Other Ambulatory Visit (HOSPITAL_BASED_OUTPATIENT_CLINIC_OR_DEPARTMENT_OTHER): Payer: Self-pay

## 2022-07-01 ENCOUNTER — Other Ambulatory Visit (HOSPITAL_BASED_OUTPATIENT_CLINIC_OR_DEPARTMENT_OTHER): Payer: Self-pay

## 2022-07-02 ENCOUNTER — Other Ambulatory Visit (HOSPITAL_BASED_OUTPATIENT_CLINIC_OR_DEPARTMENT_OTHER): Payer: Self-pay

## 2022-07-06 ENCOUNTER — Other Ambulatory Visit (HOSPITAL_BASED_OUTPATIENT_CLINIC_OR_DEPARTMENT_OTHER): Payer: Self-pay

## 2022-07-07 ENCOUNTER — Other Ambulatory Visit (HOSPITAL_BASED_OUTPATIENT_CLINIC_OR_DEPARTMENT_OTHER): Payer: Self-pay

## 2022-07-09 ENCOUNTER — Other Ambulatory Visit (HOSPITAL_BASED_OUTPATIENT_CLINIC_OR_DEPARTMENT_OTHER): Payer: Self-pay

## 2022-07-10 ENCOUNTER — Telehealth: Payer: Self-pay

## 2022-07-10 ENCOUNTER — Other Ambulatory Visit (HOSPITAL_BASED_OUTPATIENT_CLINIC_OR_DEPARTMENT_OTHER): Payer: Self-pay

## 2022-07-10 NOTE — Telephone Encounter (Addendum)
Initiated Prior authorization [id number] (0.25 or 0.5 MG/DOSE) 2MG/3ML pen-injectors Via: Covermymeds Case/Key:[id number] Status: cancelled as of 07/10/22 Reason:Our records indicates that this patient's eligibility is termed/future dated. PA request cannot be made for a patient with Termed/Future eligibility. Notified Pt via: Mychart

## 2022-07-13 ENCOUNTER — Other Ambulatory Visit (HOSPITAL_BASED_OUTPATIENT_CLINIC_OR_DEPARTMENT_OTHER): Payer: Self-pay

## 2022-07-14 ENCOUNTER — Other Ambulatory Visit (HOSPITAL_BASED_OUTPATIENT_CLINIC_OR_DEPARTMENT_OTHER): Payer: Self-pay

## 2022-07-16 ENCOUNTER — Other Ambulatory Visit (HOSPITAL_BASED_OUTPATIENT_CLINIC_OR_DEPARTMENT_OTHER): Payer: Self-pay

## 2022-09-04 ENCOUNTER — Other Ambulatory Visit: Payer: Self-pay | Admitting: Cardiology

## 2022-09-04 ENCOUNTER — Other Ambulatory Visit (HOSPITAL_BASED_OUTPATIENT_CLINIC_OR_DEPARTMENT_OTHER): Payer: Self-pay

## 2022-09-04 ENCOUNTER — Other Ambulatory Visit: Payer: Self-pay

## 2022-09-04 MED ORDER — CARVEDILOL 6.25 MG PO TABS
6.2500 mg | ORAL_TABLET | Freq: Two times a day (BID) | ORAL | 2 refills | Status: AC
  Filled 2022-09-04 – 2022-09-22 (×3): qty 60, 30d supply, fill #0
  Filled 2022-11-29: qty 60, 30d supply, fill #1
  Filled 2023-04-14: qty 60, 30d supply, fill #2
  Filled 2023-09-01: qty 60, 30d supply, fill #3

## 2022-09-04 NOTE — Telephone Encounter (Signed)
Refill to pharmacy 

## 2022-09-14 ENCOUNTER — Other Ambulatory Visit (HOSPITAL_BASED_OUTPATIENT_CLINIC_OR_DEPARTMENT_OTHER): Payer: Self-pay

## 2022-09-22 ENCOUNTER — Other Ambulatory Visit: Payer: Self-pay

## 2022-09-22 ENCOUNTER — Other Ambulatory Visit (HOSPITAL_BASED_OUTPATIENT_CLINIC_OR_DEPARTMENT_OTHER): Payer: Self-pay

## 2022-09-23 ENCOUNTER — Other Ambulatory Visit (HOSPITAL_BASED_OUTPATIENT_CLINIC_OR_DEPARTMENT_OTHER): Payer: Self-pay

## 2022-09-24 ENCOUNTER — Other Ambulatory Visit (HOSPITAL_BASED_OUTPATIENT_CLINIC_OR_DEPARTMENT_OTHER): Payer: Self-pay

## 2022-09-28 ENCOUNTER — Other Ambulatory Visit (HOSPITAL_COMMUNITY): Payer: Self-pay

## 2022-09-29 ENCOUNTER — Other Ambulatory Visit (HOSPITAL_BASED_OUTPATIENT_CLINIC_OR_DEPARTMENT_OTHER): Payer: Self-pay

## 2022-09-30 ENCOUNTER — Other Ambulatory Visit (HOSPITAL_BASED_OUTPATIENT_CLINIC_OR_DEPARTMENT_OTHER): Payer: Self-pay

## 2022-10-01 ENCOUNTER — Other Ambulatory Visit (HOSPITAL_BASED_OUTPATIENT_CLINIC_OR_DEPARTMENT_OTHER): Payer: Self-pay

## 2022-10-02 ENCOUNTER — Ambulatory Visit (INDEPENDENT_AMBULATORY_CARE_PROVIDER_SITE_OTHER): Payer: 59

## 2022-10-02 ENCOUNTER — Ambulatory Visit (INDEPENDENT_AMBULATORY_CARE_PROVIDER_SITE_OTHER): Payer: 59 | Admitting: Family Medicine

## 2022-10-02 ENCOUNTER — Encounter: Payer: Self-pay | Admitting: Family Medicine

## 2022-10-02 VITALS — BP 130/79 | HR 67 | Ht 72.0 in | Wt 284.0 lb

## 2022-10-02 DIAGNOSIS — J90 Pleural effusion, not elsewhere classified: Secondary | ICD-10-CM | POA: Diagnosis not present

## 2022-10-02 DIAGNOSIS — R0789 Other chest pain: Secondary | ICD-10-CM | POA: Diagnosis not present

## 2022-10-02 DIAGNOSIS — E119 Type 2 diabetes mellitus without complications: Secondary | ICD-10-CM | POA: Diagnosis not present

## 2022-10-02 DIAGNOSIS — I10 Essential (primary) hypertension: Secondary | ICD-10-CM

## 2022-10-02 DIAGNOSIS — J9819 Other pulmonary collapse: Secondary | ICD-10-CM

## 2022-10-02 DIAGNOSIS — J9811 Atelectasis: Secondary | ICD-10-CM | POA: Diagnosis not present

## 2022-10-02 DIAGNOSIS — R051 Acute cough: Secondary | ICD-10-CM

## 2022-10-02 DIAGNOSIS — E785 Hyperlipidemia, unspecified: Secondary | ICD-10-CM

## 2022-10-02 DIAGNOSIS — R059 Cough, unspecified: Secondary | ICD-10-CM | POA: Diagnosis not present

## 2022-10-02 LAB — POCT GLYCOSYLATED HEMOGLOBIN (HGB A1C): HbA1c, POC (controlled diabetic range): 6.4 % (ref 0.0–7.0)

## 2022-10-02 NOTE — Progress Notes (Signed)
Joshua Brady - 47 y.o. male MRN [id number]  Date of birth: [birth year]  Subjective No chief complaint on file.   HPI Joshua Brady is a 47 y.o. male here today for follow up.   Recent dx of T2DM.  Started on Ozempic at previous visit.  He never started due to insurance issues .  His diet and activity level are ***.  He does have associated HLD and is taking lipitor.  Tolerating well.  BP is ***.  Remains on protonix for GERD.  This is stable.   ROS:  A comprehensive ROS was completed and negative except as noted per HPI  Allergies  Allergen Reactions  . Other     Ants - see notes from ER visit 02/2018  . Prednisone   . Topiramate     Anxiety, dizzy, depressed, suicidal thoughts, agitated    Past Medical History:  Diagnosis Date  . Anxiety   . Borderline high cholesterol 01/25/2017   10-yr ASCVD 4.1%  . Daytime somnolence 03/11/2018  . Dyslipidemia (high LDL; low HDL) 01/25/2017   10-yr ASCVD risk 4.1% (01/2017)  . Head injury 2016  . Heart murmur   . History of brain concussion 01/21/2017  . History of low back pain 01/21/2017  . Hyperglycemia   . Hypertension    Pre-hypertension per patient  . 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 Low testosterone 04/29/2018  . Lumbar degenerative disc disease 02/06/2020  . Palpitations 03/03/2018  . Sleep apnea 03/11/2018  . SOB (shortness of breath) on exertion 03/03/2018  . Viral illness 09/11/2019    Past Surgical History:  Procedure Laterality Date  . Broken Arm    . LEFT HEART CATH AND CORONARY ANGIOGRAPHY N/A 11/22/2020   Procedure: LEFT HEART CATH AND CORONARY ANGIOGRAPHY;  Surgeon: Lennette Bihari, MD;  Location: [facility];  Service: Cardiovascular;  Laterality: N/A;  . LIVER BIOPSY      Social History   Socioeconomic History  . Marital status: Married    Spouse name: Anothy Bufano  . Number of children: 2  . Years of education: Not on file  . Highest education level: Not on file  Occupational  History  . Not on file  Tobacco Use  . Smoking status: Never    Passive exposure: Never  . Smokeless tobacco: Never  Vaping Use  . Vaping Use: Never used  Substance and Sexual Activity  . Alcohol use: Yes    Comment: social  . Drug use: No  . Sexual activity: Yes    Birth control/protection: None  Other Topics Concern  . Not on file  Social History Narrative  . Not on file   Social Determinants of Health   Financial Resource Strain: Not on file  Food Insecurity: Not on file  Transportation Needs: Not on file  Physical Activity: Not on file  Stress: Not on file  Social Connections: Not on file    Family History  Problem Relation Age of Onset  . Alcohol abuse Father   . Myelodysplastic syndrome Father   . Other Neg Hx        low testosterone    Health Maintenance  Topic Date Due  . COVID-19 Vaccine (1) Never done  . FOOT EXAM  Never done  . OPHTHALMOLOGY EXAM  Never done  . Diabetic kidney evaluation - Urine ACR  Never done  . COLONOSCOPY (Pts 45-47yrs Insurance coverage will need to be confirmed)  10/16/2022 (Originally 08/28/2018)  . Hepatitis C Screening  10/16/2022 (  Originally 08/28/1991)  . HEMOGLOBIN A1C  12/25/2022  . INFLUENZA VACCINE  01/14/2023  . Diabetic kidney evaluation - eGFR measurement  06/08/2023  . DTaP/Tdap/Td (2 - Td or Tdap) 10/31/2025  . HIV Screening  Completed  . HPV VACCINES  Aged Out     ----------------------------------------------------------------------------------------------------------------------------------------------------------------------------------------------------------------- Physical Exam There were no vitals taken for this visit.  Physical Exam  ------------------------------------------------------------------------------------------------------------------------------------------------------------------------------------------------------------------- Assessment and Plan  No problem-specific Assessment & Plan  notes found for this encounter.   No orders of the defined types were placed in this encounter.   No follow-ups on file.    This visit occurred during the SARS-CoV-2 public health emergency.  Safety protocols were in place, including screening questions prior to the visit, additional usage of staff PPE, and extensive cleaning of exam room while observing appropriate contact time as indicated for disinfecting solutions.

## 2022-10-03 LAB — MICROALBUMIN / CREATININE URINE RATIO
Creatinine, Urine: 152 mg/dL (ref 20–320)
Microalb Creat Ratio: 9 mg/g creat (ref <30)
Microalb, Ur: 1.4 mg/dL

## 2022-10-03 LAB — LIPID PANEL W/REFLEX DIRECT LDL
Cholesterol: 251 mg/dL — ABNORMAL HIGH (ref <200)
HDL: 44 mg/dL (ref >=40)
LDL Cholesterol (Calc): 170 mg/dL (calc) — ABNORMAL HIGH
Non-HDL Cholesterol (Calc): 207 mg/dL (calc) — ABNORMAL HIGH (ref <130)
Total CHOL/HDL Ratio: 5.7 (calc) — ABNORMAL HIGH (ref <5.0)
Triglycerides: 208 mg/dL — ABNORMAL HIGH (ref <150)

## 2022-10-04 NOTE — Assessment & Plan Note (Signed)
Updated chest x-ray ordered. 

## 2022-10-04 NOTE — Assessment & Plan Note (Signed)
He was unable to get Ozempic.  He is made some changes to diet and activity level.  A1c has improved.  Encouraged to keep these up.

## 2022-10-04 NOTE — Assessment & Plan Note (Signed)
Blood pressure remains well-controlled.  Continue carvedilol at current strength.

## 2022-10-06 ENCOUNTER — Other Ambulatory Visit (HOSPITAL_BASED_OUTPATIENT_CLINIC_OR_DEPARTMENT_OTHER): Payer: Self-pay

## 2022-10-07 ENCOUNTER — Other Ambulatory Visit: Payer: Self-pay | Admitting: Family Medicine

## 2022-10-07 DIAGNOSIS — J90 Pleural effusion, not elsewhere classified: Secondary | ICD-10-CM

## 2022-10-14 ENCOUNTER — Other Ambulatory Visit: Payer: Self-pay

## 2022-10-16 ENCOUNTER — Other Ambulatory Visit (HOSPITAL_BASED_OUTPATIENT_CLINIC_OR_DEPARTMENT_OTHER): Payer: Self-pay

## 2022-10-29 ENCOUNTER — Institutional Professional Consult (permissible substitution) (HOSPITAL_BASED_OUTPATIENT_CLINIC_OR_DEPARTMENT_OTHER): Payer: No Typology Code available for payment source | Admitting: Pulmonary Disease

## 2022-10-30 ENCOUNTER — Ambulatory Visit (INDEPENDENT_AMBULATORY_CARE_PROVIDER_SITE_OTHER): Payer: 59 | Admitting: Pulmonary Disease

## 2022-10-30 ENCOUNTER — Encounter (HOSPITAL_BASED_OUTPATIENT_CLINIC_OR_DEPARTMENT_OTHER): Payer: Self-pay | Admitting: Pulmonary Disease

## 2022-10-30 VITALS — BP 120/88 | HR 88 | Temp 98.1°F | Ht 72.0 in | Wt 282.2 lb

## 2022-10-30 DIAGNOSIS — R0602 Shortness of breath: Secondary | ICD-10-CM | POA: Diagnosis not present

## 2022-10-30 DIAGNOSIS — G4719 Other hypersomnia: Secondary | ICD-10-CM

## 2022-10-30 NOTE — Patient Instructions (Signed)
Shortness of breath --ORDER pulmonary function tests --Sleep test as noted below  Excessive daytime fatigue/sleepiness --ORDER split night study

## 2022-10-30 NOTE — Progress Notes (Signed)
Subjective:   PATIENT ID: Joshua Brady GENDER: male DOB: June 17, 1975, MRN: [id number]  Chief Complaint  Patient presents with   Consult    Consult.     Reason for Visit:New consult for shortness of breath  Mr. 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  S/p mediastinal mass resection 06/2021 c/b by diaphragm elevation. He has had shortness of breath before his surgery but more discomfort after surgery. Will have some coughing. Feels chest congestion but cough non-productive. Denies wheezing. Will get tired more easily and will nap in the afternoons. Last sleep test negative except during REM - not currently on CPAP. Difficulty focusing recently. Denies formal diagnosis of depression but feels he does feel down related to his work. Considering applying for disability. Denies SI/HI  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made [employer]  I have personally reviewed patient's past medical/family/social history, allergies, current medications.  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   Low testosterone 04/29/2018   Lumbar degenerative disc disease 02/06/2020   Palpitations 03/03/2018   Sleep apnea 03/11/2018   SOB (shortness of breath) on exertion 03/03/2018   Viral illness 09/11/2019     Family History  Problem Relation Age of Onset   Alcohol abuse Father    Myelodysplastic syndrome Father    Other Neg Hx        low testosterone      Social History   Occupational History   Not on file  Tobacco Use   Smoking status: Never    Passive exposure: Never   Smokeless tobacco: Never  Vaping Use   Vaping Use: Never used  Substance and Sexual Activity   Alcohol use: Yes    Comment: social   Drug use: No   Sexual activity: Yes    Birth control/protection: None    Allergies  Allergen Reactions   Other     Ants - see notes from ER visit 02/2018   Prednisone    Topiramate     Anxiety, dizzy, depressed, suicidal thoughts, agitated     Outpatient Medications Prior to Visit  Medication Sig Dispense Refill   atorvastatin (LIPITOR) 80 MG tablet Take 1 tablet (80 mg total) by mouth daily. 90 tablet 3   carvedilol (COREG) 6.25 MG tablet Take 1 tablet (6.25 mg total) by mouth 2 (two) times daily with a meal. 180 tablet 2   Continuous Glucose Sensor (FREESTYLE LIBRE 3 SENSOR) MISC Place 1 sensor on the skin every 14 days. Use to check glucose continuously. 2 each 4   meloxicam (MOBIC) 15 MG tablet Take 1 tablet (15 mg total) by mouth daily. (Patient not taking: Reported on 10/02/2022) 30 tablet 0   No facility-administered medications prior to visit.    Review of Systems  Constitutional:  Positive for malaise/fatigue. Negative for chills, diaphoresis, fever and weight loss.  HENT:  Positive for congestion.  Respiratory:  Positive for cough and shortness of breath. Negative for hemoptysis, sputum production and wheezing.   Cardiovascular:  Negative for chest pain, palpitations and leg swelling.     Objective:   Vitals:   10/30/22 1134  BP: 120/88  Pulse: 88  Temp: 98.1 F (36.7 C)  TempSrc: Oral  SpO2: 96%  Weight: 282 lb 3.2 oz (128 kg)  Height: 6' (1.829 m)   SpO2: 96 % O2 Device: None (Room air)  Physical Exam: General: Well-appearing, no acute distress HENT: [place], AT Eyes: EOMI, no scleral icterus Respiratory: Clear to auscultation bilaterally.  No crackles, wheezing or rales Cardiovascular: RRR,  -M/R/G, no JVD Extremities:-Edema,-tenderness Neuro: AAO x4, CNII-XII grossly intact Psych: Normal mood, normal affect  Data Reviewed:  Imaging: CXR 10/02/22 - Left hemidiaphragm elevation with small left pleural effusion/atelectasis  PFT: None on file  Labs: CBC    Component Value Date/Time   WBC 9.7 06/07/2022 1240   RBC 4.80 06/07/2022 1240   HGB 13.3 06/07/2022 1240   HCT 40.8 06/07/2022 1240   PLT 274 06/07/2022 1240   MCV 85.0 06/07/2022 1240   MCH 27.7 06/07/2022 1240   MCHC 32.6 06/07/2022 1240   RDW 12.8 06/07/2022 1240   LYMPHSABS 2.5 06/15/2021 1920   MONOABS 0.9 06/15/2021 1920   EOSABS 0.2 06/15/2021 1920   BASOSABS 0.0 06/15/2021 1920   Absolute eos 06/15/21 -200  Sleep: PSG 04/21/18 - 3.3/h, moderate AHI 16.6/h during REM    Assessment & Plan:   Discussion: 47 year old male with CAD, HTN, HLD, OSA, 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 Had baseline dyspnea that worsened post-op. Likely multifactorial in setting of undiagnosed/untreated OSA, deconditioning, left diaphragm elevation. Not a surgical candidate for his diaphragm per patient. Will start with sleep evaluation since STOP-BANG with intermediate risk and symptoms concerning of OSA.  Shortness of breath --ORDER pulmonary function tests --Sleep test as noted below  Excessive daytime fatigue/sleepiness --ORDER split night study  Health Maintenance Immunization History  Administered Date(s) Administered   Influenza-Unspecified 02/04/2017   Tdap 11/01/2015   CT Lung Screen- insufficient tobacco history  Orders Placed This Encounter  Procedures   Pulmonary function test    Standing Status:   Future    Standing Expiration Date:   10/30/2023    Order Specific Question:   Where should this test be performed?    Answer:   [facility]    Order Specific Question:   Full PFT: includes the following: basic  spirometry, spirometry pre & post bronchodilator, diffusion capacity (DLCO), lung volumes    Answer:   Full PFT   Split night study    Standing Status:   Future    Standing Expiration Date:   10/30/2023    Order Specific Question:   Where should this test be performed:    Answer:   [facility]  No orders of the defined types were placed in this encounter.   Return for after PFT when next available.  I have spent a total time of 45-minutes on the day of the appointment reviewing prior documentation, coordinating care and discussing medical diagnosis and plan with the patient/family. Imaging, labs and tests included in this note have been reviewed and interpreted independently by me.  Tarina Volk Mechele Collin, MD [facility] 10/30/2022 11:47 AM  Office Number [phone]

## 2022-11-03 ENCOUNTER — Encounter: Payer: Self-pay | Admitting: Family Medicine

## 2022-11-05 ENCOUNTER — Encounter: Payer: Self-pay | Admitting: Family Medicine

## 2022-11-05 ENCOUNTER — Telehealth (INDEPENDENT_AMBULATORY_CARE_PROVIDER_SITE_OTHER): Payer: 59 | Admitting: Family Medicine

## 2022-11-05 ENCOUNTER — Other Ambulatory Visit (HOSPITAL_BASED_OUTPATIENT_CLINIC_OR_DEPARTMENT_OTHER): Payer: Self-pay

## 2022-11-05 DIAGNOSIS — F4321 Adjustment disorder with depressed mood: Secondary | ICD-10-CM

## 2022-11-05 MED ORDER — BUPROPION HCL ER (XL) 150 MG PO TB24
150.0000 mg | ORAL_TABLET | Freq: Every day | ORAL | 0 refills | Status: DC
  Filled 2022-11-05: qty 30, 30d supply, fill #0
  Filled 2022-11-29: qty 30, 30d supply, fill #1
  Filled 2022-12-25: qty 30, 30d supply, fill #2

## 2022-11-05 NOTE — Progress Notes (Signed)
Joshua Brady - 47 y.o. male MRN [id number]  Date of birth: Apr 20, 1976   This visit type was conducted due to national recommendations for restrictions regarding the COVID-19 Pandemic (e.g. social distancing).  This format is felt to be most appropriate for this patient at this time.  All issues noted in this document were discussed and addressed.  No physical exam was performed (except for noted visual exam findings with Video Visits).  I discussed the limitations of evaluation and management by telemedicine and the availability of in person appointments. The patient expressed understanding and agreed to proceed.  I connected withNAME@ on 11/05/22 at 11:30 AM EDT by a video enabled telemedicine application and verified that I am speaking with the correct person using two identifiers.  Present at visit: Joshua Coombe, DO Gay Filler   Patient Location: Home [address] [place] [address]   Provider location:   [facility]  Chief Complaint  Patient presents with   Stress    HPI  Joshua Brady is a 47 y.o. male who presents via audio/video conferencing for a telehealth visit today.  [name] is following up for fatigue.  He did see his pulmonologist recently who reviewed his recent studies and felt that everything was stable.  She do not feel that his pulmonary disease was contributing to his symptoms.  Recommend that he discuss alteration in mood related to his chronic illness as this may be contributing to his fatigue.  He is interested in trying medication to help with this.     10/02/2022    8:32 AM 06/26/2022   11:27 AM 10/15/2021   11:50 AM  Depression screen PHQ 2/9  Decreased Interest 1 0 1  Down, Depressed, Hopeless 0 0 0  PHQ - 2 Score 1 0 1        ROS:  A comprehensive ROS was completed and negative except as noted per HPI  Past Medical History:  Diagnosis Date   Anxiety    Borderline high cholesterol 01/25/2017   10-yr ASCVD 4.1%   Daytime somnolence 03/11/2018    Dyslipidemia (high LDL; low HDL) 01/25/2017   10-yr ASCVD risk 4.1% (01/2017)   Head injury 2016   Heart murmur    History of brain concussion 01/21/2017   History of low back pain 01/21/2017   Hyperglycemia    Hypertension    Pre-hypertension per patient   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   Low testosterone 04/29/2018   Lumbar degenerative disc disease 02/06/2020   Palpitations 03/03/2018   Sleep apnea 03/11/2018   SOB (shortness of breath) on exertion 03/03/2018   Viral illness 09/11/2019    Past Surgical History:  Procedure Laterality Date   Broken Arm     LEFT HEART CATH AND CORONARY ANGIOGRAPHY N/A 11/22/2020   Procedure: LEFT HEART CATH AND CORONARY ANGIOGRAPHY;  Surgeon: Lennette Bihari, MD;  Location: [facility];  Service: Cardiovascular;  Laterality: N/A;   LIVER BIOPSY      Family History  Problem Relation Age of Onset   Alcohol abuse Father    Myelodysplastic syndrome Father    Other Neg Hx        low testosterone    Social History   Socioeconomic History   Marital status: Married    Spouse name: Yoriel Crispell   Number of children: 2   Years of education: Not on file   Highest education level: Not on file  Occupational History   Not on file  Tobacco Use  Smoking status: Never    Passive exposure: Never   Smokeless tobacco: Never  Vaping Use   Vaping Use: Never used  Substance and Sexual Activity   Alcohol use: Yes    Comment: social   Drug use: No   Sexual activity: Yes    Birth control/protection: None  Other Topics Concern   Not on file  Social History Narrative   Not on file   Social Determinants of Health   Financial Resource Strain: Not on file  Food Insecurity: Not on file  Transportation Needs: Not on file  Physical Activity: Not on file  Stress: Not on file  Social Connections: Not on file  Intimate Partner Violence: Not on file     Current Outpatient Medications:    buPROPion (WELLBUTRIN XL) 150 MG 24 hr  tablet, Take 1 tablet (150 mg total) by mouth daily., Disp: 90 tablet, Rfl: 0   atorvastatin (LIPITOR) 80 MG tablet, Take 1 tablet (80 mg total) by mouth daily., Disp: 90 tablet, Rfl: 3   carvedilol (COREG) 6.25 MG tablet, Take 1 tablet (6.25 mg total) by mouth 2 (two) times daily with a meal., Disp: 180 tablet, Rfl: 2   Continuous Glucose Sensor (FREESTYLE LIBRE 3 SENSOR) MISC, Place 1 sensor on the skin every 14 days. Use to check glucose continuously., Disp: 2 each, Rfl: 4  EXAM:  VITALS per patient if applicable: There were no vitals taken for this visit.  GENERAL: alert, oriented, appears well and in no acute distress  HEENT: atraumatic, conjunttiva clear, no obvious abnormalities on inspection of external nose and ears  NECK: normal movements of the head and neck  LUNGS: on inspection no signs of respiratory distress, breathing rate appears normal, no obvious gross SOB, gasping or wheezing  CV: no obvious cyanosis  MS: moves all visible extremities without noticeable abnormality  PSYCH/NEURO: pleasant and cooperative, no obvious depression or anxiety, speech and thought processing grossly intact  ASSESSMENT AND PLAN:  Discussed the following assessment and plan:  Adjustment disorder with depressed mood Has had some alteration in mood which may be contributing to his chronic fatigue.  Discussed adding bupropion think this will be appropriate to help with his anhedonia without causing significant side effects such as weight gain or sexual side effects.  Bupropion XL 150 mg sent in.     I discussed the assessment and treatment plan with the patient. The patient was provided an opportunity to ask questions and all were answered. The patient agreed with the plan and demonstrated an understanding of the instructions.   The patient was advised to call back or seek an in-person evaluation if the symptoms worsen or if the condition fails to improve as anticipated.    Joshua Coombe,  DO

## 2022-11-05 NOTE — Assessment & Plan Note (Addendum)
Has had some alteration in mood which may be contributing to his chronic fatigue.  Discussed adding bupropion think this will be appropriate to help with his anhedonia without causing significant side effects such as weight gain or sexual side effects.  Bupropion XL 150 mg sent in.

## 2022-11-12 ENCOUNTER — Other Ambulatory Visit (HOSPITAL_BASED_OUTPATIENT_CLINIC_OR_DEPARTMENT_OTHER): Payer: Self-pay

## 2022-11-13 ENCOUNTER — Other Ambulatory Visit (HOSPITAL_BASED_OUTPATIENT_CLINIC_OR_DEPARTMENT_OTHER): Payer: Self-pay

## 2022-11-27 ENCOUNTER — Encounter (HOSPITAL_BASED_OUTPATIENT_CLINIC_OR_DEPARTMENT_OTHER): Payer: Self-pay

## 2022-11-30 ENCOUNTER — Ambulatory Visit (INDEPENDENT_AMBULATORY_CARE_PROVIDER_SITE_OTHER): Payer: 59 | Admitting: Pulmonary Disease

## 2022-11-30 DIAGNOSIS — R0602 Shortness of breath: Secondary | ICD-10-CM

## 2022-11-30 LAB — PULMONARY FUNCTION TEST
DL/VA % pred: 143 %
DL/VA: 6.34 ml/min/mmHg/L
DLCO cor % pred: 87 %
DLCO cor: 27.43 ml/min/mmHg
DLCO unc % pred: 87 %
DLCO unc: 27.43 ml/min/mmHg
FEF 25-75 Post: 0.74 L/sec
FEF 25-75 Pre: 0.52 L/sec
FEF2575-%Change-Post: 42 %
FEF2575-%Pred-Post: 20 %
FEF2575-%Pred-Pre: 14 %
FEV1-%Change-Post: 88 %
FEV1-%Pred-Post: 33 %
FEV1-%Pred-Pre: 17 %
FEV1-Post: 1.42 L
FEV1-Pre: 0.75 L
FEV1FVC-%Change-Post: 84 %
FEV1FVC-%Pred-Pre: 35 %
FEV6-%Change-Post: 2 %
FEV6-%Pred-Post: 51 %
FEV6-%Pred-Pre: 50 %
FEV6-Post: 2.72 L
FEV6-Pre: 2.67 L
FEV6FVC-%Change-Post: 1 %
FEV6FVC-%Pred-Post: 103 %
FEV6FVC-%Pred-Pre: 101 %
FVC-%Change-Post: 2 %
FVC-%Pred-Post: 50 %
FVC-%Pred-Pre: 49 %
FVC-Post: 2.77 L
FVC-Pre: 2.71 L
Post FEV1/FVC ratio: 51 %
Post FEV6/FVC ratio: 100 %
Pre FEV1/FVC ratio: 28 %
Pre FEV6/FVC Ratio: 98 %
RV % pred: 84 %
RV: 1.79 L
TLC % pred: 66 %
TLC: 4.91 L

## 2022-11-30 NOTE — Progress Notes (Unsigned)
Full PFT performed today. °

## 2022-11-30 NOTE — Patient Instructions (Signed)
Full PFT performed today. °

## 2022-12-07 ENCOUNTER — Other Ambulatory Visit: Payer: Self-pay | Admitting: Thoracic Surgery (Cardiothoracic Vascular Surgery)

## 2022-12-07 ENCOUNTER — Encounter (HOSPITAL_BASED_OUTPATIENT_CLINIC_OR_DEPARTMENT_OTHER): Payer: No Typology Code available for payment source

## 2022-12-07 ENCOUNTER — Ambulatory Visit (HOSPITAL_BASED_OUTPATIENT_CLINIC_OR_DEPARTMENT_OTHER): Payer: 59 | Attending: Pulmonary Disease | Admitting: Internal Medicine

## 2022-12-07 DIAGNOSIS — G4719 Other hypersomnia: Secondary | ICD-10-CM | POA: Diagnosis not present

## 2022-12-07 DIAGNOSIS — G4733 Obstructive sleep apnea (adult) (pediatric): Secondary | ICD-10-CM | POA: Diagnosis not present

## 2022-12-07 DIAGNOSIS — J9859 Other diseases of mediastinum, not elsewhere classified: Secondary | ICD-10-CM

## 2022-12-08 ENCOUNTER — Other Ambulatory Visit (HOSPITAL_BASED_OUTPATIENT_CLINIC_OR_DEPARTMENT_OTHER): Payer: Self-pay

## 2022-12-08 ENCOUNTER — Ambulatory Visit: Payer: Self-pay | Admitting: Thoracic Surgery (Cardiothoracic Vascular Surgery)

## 2022-12-08 ENCOUNTER — Ambulatory Visit
Admission: RE | Admit: 2022-12-08 | Discharge: 2022-12-08 | Disposition: A | Discharge status: Home / Self Care | Payer: 59 | Source: Ambulatory Visit | Attending: Thoracic Surgery (Cardiothoracic Vascular Surgery) | Admitting: Thoracic Surgery (Cardiothoracic Vascular Surgery)

## 2022-12-08 VITALS — BP 138/90 | HR 94 | Resp 18 | Ht 72.0 in | Wt 283.0 lb

## 2022-12-08 DIAGNOSIS — J9811 Atelectasis: Secondary | ICD-10-CM | POA: Diagnosis not present

## 2022-12-08 DIAGNOSIS — J9859 Other diseases of mediastinum, not elsewhere classified: Secondary | ICD-10-CM

## 2022-12-08 DIAGNOSIS — J986 Disorders of diaphragm: Secondary | ICD-10-CM | POA: Diagnosis not present

## 2022-12-08 NOTE — Progress Notes (Signed)
[address]       [place] [address]             [phone]      HPI: Mr. Pierpoint returns for a scheduled follow-up visit for an elevated left hemidiaphragm following resection of a anterior mediastinal mass.  Sricharan Lacomb is a 47 year old man with a history of CAD, dyspnea, hypertension, hyperlipidemia, obesity, obstructive sleep apnea, heart murmur, closed head injury, and a cystic mediastinal mass.  He underwent a robotic assisted resection of a mediastinal cyst in January 2023.  The cyst turned out to be benign.  The cyst and cervical the phrenic nerve were able to dissected off the phrenic nerve and leave the nerve intact.  However he did develop an elevated left hemidiaphragm postoperatively.  I last saw him in the office in December.  He was feeling better at that time so I did not recommend any intervention.  In the interim since the last visit he has continued to have issues with fatigue and tiring easily.  He describes this more as sleepiness and needing to take a nap during the day than actual shortness of breath with exertion.  He does have some shortness of breath with exertion but that is not his limiting factor.  He saw Dr. Everardo All and had a sleep study last night.  He follows up with her on Thursday.  He was started on an antidepressant and initially felt a great deal more energy and was not having any issues with shortness of breath for about the first week.  However that has leveled off.  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 90 tablet 3   buPROPion (WELLBUTRIN XL) 150 MG 24 hr tablet Take 1 tablet (150 mg total) by mouth daily. 90 tablet 0   Continuous Glucose Sensor (FREESTYLE LIBRE 3 SENSOR) MISC Place 1 sensor on the skin every 14 days. Use to check glucose continuously. 2 each 4   carvedilol (COREG) 6.25 MG tablet Take 1 tablet (6.25 mg total) by mouth 2 (two) times daily with a meal. (Patient not taking: Reported on 12/08/2022) 180 tablet 2   No current facility-administered medications for this visit.    Physical Exam BP (!) 138/90   Pulse 94   Resp 18   Ht 6' (1.829 m)   Wt 283 lb (128.4 kg)   SpO2 95% Comment: RA  BMI 38.60 kg/m  47 year old man in no acute distress Alert and oriented x 3 with no focal deficits Lungs diminished breath sounds at left base, otherwise clear Cardiac regular rate and rhythm  Diagnostic Tests: I personally reviewed the chest x-ray images.  No  change in the elevated left hemidiaphragm.  Impression: Joshua Brady is a 47 year old man with a history of CAD, dyspnea, hypertension, hyperlipidemia, obesity, obstructive sleep apnea, heart murmur, closed head injury, and a cystic mediastinal mass.   Cystic mediastinal mass-resected about 18 months ago.  Was a benign cystic lesion.  No specific follow-up needed.  Postoperative elevation of left diaphragm-the mass encircled the phrenic nerve.  The nerve was preserved but the blood supply was probably disrupted with resection of the cyst..  Daytime somnolence and lack of energy-agree that this likely is multifactorial.  There is likely component of depression.  This seems improved.  Also likely  has sleep apnea.  He had a sleep study and then will follow-up with Dr. Everardo All later this week.  If he has issues with shortness of breath with exertion once those issues are optimized then it would be reasonable to consider plication of his diaphragm.  Plan: Follow-up with Dr. Everardo All as scheduled I will be happy to see Mr. Devan back should he wish to discuss possible plication of the diaphragm.  I spent over 20 minutes in review of records, images, and in consultation with Mr. Yom today Loreli Slot, MD [facility] [phone]

## 2022-12-09 DIAGNOSIS — G4719 Other hypersomnia: Secondary | ICD-10-CM

## 2022-12-09 NOTE — Procedures (Signed)
Patient Name: Joshua Brady, Joshua Brady Date: 12/07/2022 Gender: Male D.O.B: March 05, 1976 Age (years): 29 Referring Provider: Chi Mechele Collin Height (inches): 72 Interpreting Physician: Jetty Duhamel MD, ABSM Weight (lbs): 280 RPSGT: Shelah Lewandowsky BMI: 38 MRN: [id number] Neck Size: 18.75  CLINICAL INFORMATION Sleep Study Type: Split Night CPAP Indication for sleep study: Excessive Daytime Sleepiness, Fatigue, Obesity, OSA, Snoring Epworth Sleepiness Score: 5  SLEEP STUDY TECHNIQUE As per the AASM Manual for the Scoring of Sleep and Associated Events v2.3 (April 2016) with a hypopnea requiring 4% desaturations.  The channels recorded and monitored were frontal, central and occipital EEG, electrooculogram (EOG), submentalis EMG (chin), nasal and oral airflow, thoracic and abdominal wall motion, anterior tibialis EMG, snore microphone, electrocardiogram, and pulse oximetry. Continuous positive airway pressure (CPAP) was initiated when the patient met split night criteria and was titrated according to treat sleep-disordered breathing.  MEDICATIONS Medications self-administered by patient taken the night of the study : none reported  RESPIRATORY PARAMETERS Diagnostic  Total AHI (/hr): 21.3 RDI (/hr): 34.6 OA Index (/hr): - CA Index (/hr): 0.0 REM AHI (/hr): N/A NREM AHI (/hr): 21.3 Supine AHI (/hr): 76.1 Non-supine AHI (/hr): 10.8 Min O2 Sat (%): 81.0 Mean O2 (%): 92.4 Time below 88% (min): 1   Titration  Optimal Pressure (cm): 14 AHI at Optimal Pressure (/hr): 2.7 Min O2 at Optimal Pressure (%): 87.0 Supine % at Optimal (%): 100 Sleep % at Optimal (%): 99   SLEEP ARCHITECTURE The recording time for the entire night was 414 minutes.  During a baseline period of 182.6 minutes, the patient slept for 126.5 minutes in REM and nonREM, yielding a sleep efficiency of 69.3%. Sleep onset after lights out was 36.4 minutes with a REM latency of N/A minutes. The patient spent 13.0% of the  night in stage N1 sleep, 87.0% in stage N2 sleep, 0.0% in stage N3 and 0% in REM.  During the titration period of 225.4 minutes, the patient slept for 202.0 minutes in REM and nonREM, yielding a sleep efficiency of 89.6%. Sleep onset after CPAP initiation was 10.8 minutes with a REM latency of 59.0 minutes. The patient spent 7.4% of the night in stage N1 sleep, 53.2% in stage N2 sleep, 0.0% in stage N3 and 39.4% in REM.  CARDIAC DATA The 2 lead EKG demonstrated sinus rhythm. The mean heart rate was 75.4 beats per minute. Other EKG findings include: None.  LEG MOVEMENT DATA The total Periodic Limb Movements of Sleep (PLMS) were 0. The PLMS index was 0.0 .  IMPRESSIONS - Moderate obstructive sleep apnea occurred during the diagnostic portion of the study(AHI = 21.3/hour). An optimal PAP pressure was selected for this patient ( 14 cm of water) - No significant central sleep apnea occurred during the diagnostic portion of the study (CAI = 0.0/hour). - Mild oxygen desaturation was noted during the diagnostic portion of the study (Min O2 = 81.0%). On CPAP 14, minimum O2 saturation 87%, mean 94.6% - The patient snored with moderate snoring volume during the diagnostic portion of the study. - No cardiac abnormalities were noted during this study. - Clinically significant periodic limb movements did not occur during sleep.  DIAGNOSIS - Obstructive Sleep Apnea (G47.33)  RECOMMENDATIONS - Trial of CPAP therapy on 14 cm H2O or autopap 10-20. - Patient used a Medium size Fisher&Paykel Full Face Simplus mask and heated humidification. - Be careful with alcohol, sedatives and other CNS depressants that may worsen sleep apnea and disrupt normal sleep architecture. - Sleep hygiene  should be reviewed to assess factors that may improve sleep quality. - Weight management and regular exercise should be initiated or continued.  [Electronically signed] 12/09/2022 01:06 PM  Jetty Duhamel MD, ABSM Diplomate,  American Board of Sleep Medicine NPI: [id number]                         Jetty Duhamel Diplomate, American Board of Sleep Medicine  ELECTRONICALLY SIGNED ON:  12/09/2022, 1:03 PM [facility] PH: [phone]   FX: [phone] ACCREDITED BY THE AMERICAN ACADEMY OF SLEEP MEDICINE

## 2022-12-10 ENCOUNTER — Encounter (HOSPITAL_BASED_OUTPATIENT_CLINIC_OR_DEPARTMENT_OTHER): Payer: Self-pay | Admitting: Pulmonary Disease

## 2022-12-10 ENCOUNTER — Ambulatory Visit (HOSPITAL_BASED_OUTPATIENT_CLINIC_OR_DEPARTMENT_OTHER): Payer: 59 | Admitting: Pulmonary Disease

## 2022-12-10 VITALS — BP 136/76 | HR 99 | Temp 98.2°F | Ht 72.0 in | Wt 285.6 lb

## 2022-12-10 DIAGNOSIS — G4733 Obstructive sleep apnea (adult) (pediatric): Secondary | ICD-10-CM | POA: Diagnosis not present

## 2022-12-10 DIAGNOSIS — J984 Other disorders of lung: Secondary | ICD-10-CM

## 2022-12-10 NOTE — Patient Instructions (Signed)
Shortness of breath Severe restrictive defect --PFTs reviewed. May be inaccurate as clinical symptoms not consistent with obstructive. Though restrictive defect would clinically correlate --No bronchodilators for now  Moderate OSA The natural history, progression and prognosis of sleep apnea, treatment with PAP and alternative treatment strategies were discussed. The patient was also educated regarding the long term cardiovascular benefits of treating sleep apnea, including improved blood pressure control, reduction in MI and stroke risk as well as other potential benefits of treatment, such as improved glycemic control, facilitation of weight loss, improved energy during the day and improved sleep quality. --Counseled on sleep hygiene --Counseled on weight loss/maintenance of healthy weight --Counseled NOT to drive if/when sleepy --Advised patient to wear CPAP for at least 4 hours each night for greater than 70% of the time to avoid the machine being repossessed by insurance.

## 2022-12-10 NOTE — Progress Notes (Signed)
Subjective:   PATIENT ID: Joshua Brady GENDER: male DOB: [birth year], MRN: [id number]  Chief Complaint  Patient presents with   Follow-up    Follow up. Patient has no complaints.     Reason for Visit: Follow-up shortness of breath  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  Initial consult S/p mediastinal mass resection 06/2021 c/b by diaphragm elevation. He has had shortness of breath before his surgery but more discomfort after surgery. Will have some coughing. Feels chest congestion but cough non-productive. Denies wheezing. Will get tired more easily and will nap in the afternoons. Last sleep test negative except during REM - not currently on CPAP. Difficulty focusing recently. Denies formal diagnosis of depression but feels he does feel down related to his work. Considering applying for disability. Denies SI/HI  12/07/22 Since our last visit he reports his shortness of breath is unchanged but not limiting. Denies cough or wheezing. He is more concerned about his naps and feeling fatigued. He has been seen by CTS with Dr. Dorris Fetch for elevated left hemidiaphragm, no surgical intervention recommended.  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   Low testosterone 04/29/2018   Lumbar degenerative disc disease 02/06/2020   Palpitations 03/03/2018   Sleep apnea 03/11/2018   SOB  (shortness of breath) on exertion 03/03/2018   Viral illness 09/11/2019     Family History  Problem Relation Age of Onset   Alcohol abuse Father    Myelodysplastic syndrome Father    Other Neg Hx        low testosterone     Social History   Occupational History   Not on file  Tobacco Use   Smoking status: Never    Passive exposure: Never   Smokeless tobacco: Never  Vaping Use   Vaping Use: Never used  Substance and Sexual Activity   Alcohol use: Yes    Comment: social   Drug use: No   Sexual activity: Yes    Birth control/protection: None    Allergies  Allergen Reactions   Other     Ants - see notes from ER visit 02/2018   Prednisone    Topiramate     Anxiety, dizzy, depressed, suicidal thoughts, agitated     Outpatient Medications Prior to Visit  Medication Sig Dispense Refill   atorvastatin (LIPITOR) 80 MG tablet Take 1 tablet (80 mg total) by mouth daily. 90 tablet 3   buPROPion (WELLBUTRIN XL) 150 MG 24 hr tablet Take 1 tablet (150 mg total) by mouth daily. 90 tablet 0   Continuous Glucose Sensor (FREESTYLE LIBRE 3 SENSOR) MISC Place 1 sensor on the skin every 14 days. Use to check glucose continuously. 2 each 4   carvedilol (COREG) 6.25 MG tablet Take 1 tablet (6.25 mg total) by mouth 2 (two) times daily with a meal. (Patient  not taking: Reported on 12/08/2022) 180 tablet 2   No facility-administered medications prior to visit.    Review of Systems  Constitutional:  Positive for malaise/fatigue. Negative for chills, diaphoresis, fever and weight loss.  HENT:  Negative for congestion.   Respiratory:  Positive for shortness of breath. Negative for cough, hemoptysis, sputum production and wheezing.   Cardiovascular:  Negative for chest pain, palpitations and leg swelling.     Objective:   Vitals:   12/10/22 0840  BP: 136/76  Pulse: 99  Temp: 98.2 F (36.8 C)  TempSrc: Oral  SpO2: 95%  Weight: 285 lb 9.6 oz (129.5 kg)  Height: 6' (1.829 m)   SpO2:  95 % O2 Device: None (Room air)  Physical Exam: General: Well-appearing, no acute distress HENT: [place], AT Eyes: EOMI, no scleral icterus Respiratory: Clear to auscultation bilaterally.  No crackles, wheezing or rales Cardiovascular: RRR, -M/R/G, no JVD Extremities:-Edema,-tenderness Neuro: AAO x4, CNII-XII grossly intact Psych: Normal mood, normal affect  Data Reviewed:  Imaging: CXR 10/02/22 - Left hemidiaphragm elevation with small left pleural effusion/atelectasis CXR 12/08/2022-chronic elevation of left diaphragm  PFT: 11/30/22 FVC 2.77 (50%) FEV1 1.42 (33%) Ratio 51  TLC 66% DLCO 87% Interpretation: Very severe restrictive defect +/- effort. Severe airflow limitation on spirometry with significant bronchodilator response. F- V difficult to discern obstructive defect. Clinically correlate   Labs: CBC    Component Value Date/Time   WBC 9.7 06/07/2022 1240   RBC 4.80 06/07/2022 1240   HGB 13.3 06/07/2022 1240   HCT 40.8 06/07/2022 1240   PLT 274 06/07/2022 1240   MCV 85.0 06/07/2022 1240   MCH 27.7 06/07/2022 1240   MCHC 32.6 06/07/2022 1240   RDW 12.8 06/07/2022 1240   LYMPHSABS 2.5 06/15/2021 1920   MONOABS 0.9 06/15/2021 1920   EOSABS 0.2 06/15/2021 1920   BASOSABS 0.0 06/15/2021 1920   Absolute eos 06/15/21 -200  Sleep: PSG 04/21/18 - 3.3/h, moderate AHI 16.6/h during REM    Assessment & Plan:   Discussion: 47 year old male with CAD, HTN, HLD, OSA, hx mediastinal cyst s/p resection 2023 with post op elevated left hemidiaphragm who presents for shortness of breath and fatige. Had baseline dyspnea that worsened post-op but functional. Shortness of breath likely multifactorial in setting of untreated OSA, deconditioning, left diaphragm elevation. Not a surgical candidate for his diaphragm.  Reviewed PFTs and sleep study which is consistent with a severe restrictive defect and moderate sleep apnea.  Management noted as below  Shortness of breath Severe restrictive  defect --PFTs reviewed. May be inaccurate as clinical symptoms not consistent with obstructive. Though restrictive defect would clinically correlate --No bronchodilators for now  Moderate OSA The natural history, progression and prognosis of sleep apnea, treatment with PAP and alternative treatment strategies were discussed. The patient was also educated regarding the long term cardiovascular benefits of treating sleep apnea, including improved blood pressure control, reduction in MI and stroke risk as well as other potential benefits of treatment, such as improved glycemic control, facilitation of weight loss, improved energy during the day and improved sleep quality. --Counseled on sleep hygiene --Counseled on weight loss/maintenance of healthy weight --Counseled NOT to drive if/when sleepy --Advised patient to wear CPAP for at least 4 hours each night for greater than 70% of the time to avoid the machine being repossessed by insurance.   Health Maintenance Immunization History  Administered Date(s) Administered   Influenza-Unspecified 02/04/2017   Tdap 11/01/2015   CT Lung Screen- insufficient tobacco history  Orders Placed This Encounter  Procedures   AMB REFERRAL FOR DME    Referral Priority:   Routine    Referral Type:   Durable Medical Equipment Purchase  No orders of the defined types were placed in this encounter.   Return in about 3 months (around 03/12/2023).  I have spent a total time of 32-minutes on the day of the appointment including chart review, data review, collecting history, coordinating care and discussing medical diagnosis and plan with the patient/family. Past medical history, allergies, medications were reviewed. Pertinent imaging, labs and tests included in this note have been reviewed and interpreted independently by me.  Garlon Tuggle Mechele Collin, MD [facility] 12/10/2022 12:13 PM  Office Number [phone]

## 2022-12-15 DIAGNOSIS — G4733 Obstructive sleep apnea (adult) (pediatric): Secondary | ICD-10-CM | POA: Diagnosis not present

## 2022-12-25 ENCOUNTER — Other Ambulatory Visit (HOSPITAL_BASED_OUTPATIENT_CLINIC_OR_DEPARTMENT_OTHER): Payer: Self-pay

## 2023-01-25 ENCOUNTER — Other Ambulatory Visit: Payer: Self-pay | Admitting: Family Medicine

## 2023-01-25 ENCOUNTER — Other Ambulatory Visit: Payer: Self-pay

## 2023-01-25 ENCOUNTER — Other Ambulatory Visit (HOSPITAL_BASED_OUTPATIENT_CLINIC_OR_DEPARTMENT_OTHER): Payer: Self-pay

## 2023-01-25 MED ORDER — FREESTYLE LIBRE 3 SENSOR MISC
4 refills | Status: AC
  Filled 2023-01-25: qty 2, 28d supply, fill #0
  Filled 2023-03-16: qty 2, 28d supply, fill #1
  Filled 2023-04-14: qty 2, 28d supply, fill #2

## 2023-01-25 MED ORDER — BUPROPION HCL ER (XL) 150 MG PO TB24
150.0000 mg | ORAL_TABLET | Freq: Every day | ORAL | 0 refills | Status: DC
  Filled 2023-01-25: qty 30, 30d supply, fill #0

## 2023-01-29 ENCOUNTER — Ambulatory Visit: Payer: No Typology Code available for payment source | Admitting: Family Medicine

## 2023-02-01 ENCOUNTER — Other Ambulatory Visit (HOSPITAL_BASED_OUTPATIENT_CLINIC_OR_DEPARTMENT_OTHER): Payer: Self-pay

## 2023-02-01 ENCOUNTER — Encounter: Payer: Self-pay | Admitting: Family Medicine

## 2023-02-01 ENCOUNTER — Ambulatory Visit: Payer: 59 | Admitting: Family Medicine

## 2023-02-01 VITALS — BP 132/84 | HR 77 | Ht 72.0 in | Wt 285.8 lb

## 2023-02-01 DIAGNOSIS — E119 Type 2 diabetes mellitus without complications: Secondary | ICD-10-CM | POA: Diagnosis not present

## 2023-02-01 DIAGNOSIS — F4321 Adjustment disorder with depressed mood: Secondary | ICD-10-CM

## 2023-02-01 DIAGNOSIS — Z8739 Personal history of other diseases of the musculoskeletal system and connective tissue: Secondary | ICD-10-CM

## 2023-02-01 LAB — POCT GLYCOSYLATED HEMOGLOBIN (HGB A1C): HbA1c POC (<> result, manual entry): 7.1 % (ref 4.0–5.6)

## 2023-02-01 MED ORDER — BUPROPION HCL ER (XL) 150 MG PO TB24
150.0000 mg | ORAL_TABLET | Freq: Every day | ORAL | 3 refills | Status: DC
  Filled 2023-02-01 – 2023-02-05 (×2): qty 30, 30d supply, fill #0
  Filled 2023-03-01: qty 30, 30d supply, fill #1
  Filled 2023-03-16: qty 30, 30d supply, fill #2

## 2023-02-01 NOTE — Assessment & Plan Note (Signed)
Encouraged to work on dietary changes and increase activity level to get blood sugars under better control.

## 2023-02-01 NOTE — Assessment & Plan Note (Signed)
Overall he is doing well with bupropion.  Will continue at current strength.

## 2023-02-01 NOTE — Assessment & Plan Note (Signed)
Intermittent.  Managing with muscle rubs and ibuprofen as needed.

## 2023-02-01 NOTE — Progress Notes (Signed)
Joshua Brady - 47 y.o. male MRN [id number]  Date of birth: [birth year]  Subjective Chief Complaint  Patient presents with   Diabetes    HPI [name] is a 47 y.o. male here today for follow up visit.   He reports that he is doing pretty well. .   He has been trying to control blood sugars with dietary changes.  His A1c has increased to 7.1% since last visit.  He reports that diet has remained the same and activity level is slightly decreased.  Has had some back pain which keeps him from doing things sometimes.  This is  chronic and pain comes and goes.  He is doing better today.  Gets relief with ibuprofen and muscle rubs.    Continues on coreg and atorvastatin due to history of CAD.  He denies new or worsening chest pain, dyspnea, headaches.    Feels that bupropion is doing pretty well.  He felt quite energetic when he first started this, however this has tapered off some at this point.  He did try going off but family noted a big difference with him coming off of medication so he restarted this.     ROS:  A comprehensive ROS was completed and negative except as noted per HPI  Allergies  Allergen Reactions   Other     Ants - see notes from ER visit 02/2018   Prednisone    Topiramate     Anxiety, dizzy, depressed, suicidal thoughts, agitated    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   Low testosterone 04/29/2018   Lumbar degenerative disc disease 02/06/2020   Palpitations 03/03/2018   Sleep apnea 03/11/2018   SOB (shortness of breath) on exertion 03/03/2018   Viral  illness 09/11/2019    Past Surgical History:  Procedure Laterality Date   Broken Arm     LEFT HEART CATH AND CORONARY ANGIOGRAPHY N/A 11/22/2020   Procedure: LEFT HEART CATH AND CORONARY ANGIOGRAPHY;  Surgeon: Lennette Bihari, MD;  Location: [facility];  Service: Cardiovascular;  Laterality: N/A;   LIVER BIOPSY      Social History   Socioeconomic History   Marital status: Married    Spouse name: Torion Shaak   Number of children: 2   Years of education: Not on file   Highest education level: Not on file  Occupational History   Not on file  Tobacco Use   Smoking status: Never    Passive exposure: Never   Smokeless tobacco: Never  Vaping Use   Vaping status: Never Used  Substance and Sexual Activity   Alcohol use: Yes    Comment: social   Drug use: No   Sexual activity: Yes    Birth control/protection: None  Other Topics Concern   Not on file  Social History Narrative   Not on file   Social Determinants of Health   Financial Resource Strain: Not on file  Food Insecurity: Not on file  Transportation Needs: Not on file  Physical Activity: Not on file  Stress: Not on file  Social Connections: Unknown (10/28/2021)   [facility]   Social Network    Social Network: Not on file    Family History  Problem Relation Age of Onset   Alcohol abuse Father    Myelodysplastic syndrome Father    Other Neg Hx        low testosterone    Health Maintenance  Topic Date Due   Hepatitis C Screening  Never done   Colonoscopy  Never done   INFLUENZA VACCINE  01/14/2023   COVID-19 Vaccine (1 - 2023-24 season) 03/20/2023 (Originally 02/13/2022)   OPHTHALMOLOGY EXAM  04/03/2023 (Originally 08/28/1983)   Diabetic kidney evaluation - eGFR measurement  06/08/2023   HEMOGLOBIN A1C  08/04/2023   Diabetic kidney evaluation - Urine ACR  10/02/2023   FOOT EXAM  10/02/2023   DTaP/Tdap/Td (2 - Td or Tdap) 10/31/2025   HIV Screening  Completed   HPV VACCINES  Aged  Out     ----------------------------------------------------------------------------------------------------------------------------------------------------------------------------------------------------------------- Physical Exam BP 132/84   Pulse 77   Ht 6' (1.829 m)   Wt 285 lb 12 oz (129.6 kg)   SpO2 96%   BMI 38.75 kg/m   Physical Exam Constitutional:      Appearance: Normal appearance.  HENT:     Head: Normocephalic and atraumatic.  Eyes:     General: No scleral icterus. Cardiovascular:     Rate and Rhythm: Normal rate and regular rhythm.  Pulmonary:     Effort: Pulmonary effort is normal.     Breath sounds: Normal breath sounds.  Musculoskeletal:     Cervical back: Neck supple.  Neurological:     Mental Status: He is alert.  Psychiatric:        Mood and Affect: Mood normal.        Behavior: Behavior normal.     ------------------------------------------------------------------------------------------------------------------------------------------------------------------------------------------------------------------- Assessment and Plan  Type 2 diabetes mellitus without complication, without long-term current use of insulin (HCC) Encouraged to work on dietary changes and increase activity level to get blood sugars under better control.   History of low back pain Intermittent.  Managing with muscle rubs and ibuprofen as needed.   Adjustment disorder with depressed mood Overall he is doing well with bupropion.  Will continue at current strength.     Meds ordered this encounter  Medications   buPROPion (WELLBUTRIN XL) 150 MG 24 hr tablet    Sig: Take 1 tablet (150 mg total) by mouth daily.    Dispense:  90 tablet    Refill:  3    Return in about 4 months (around 06/03/2023) for F/u T2DM.    This visit occurred during the SARS-CoV-2 public health emergency.  Safety protocols were in place, including screening questions prior to the visit, additional  usage of staff PPE, and extensive cleaning of exam room while observing appropriate contact time as indicated for disinfecting solutions.

## 2023-02-05 ENCOUNTER — Other Ambulatory Visit (HOSPITAL_BASED_OUTPATIENT_CLINIC_OR_DEPARTMENT_OTHER): Payer: Self-pay

## 2023-03-01 ENCOUNTER — Other Ambulatory Visit: Payer: Self-pay

## 2023-03-01 ENCOUNTER — Other Ambulatory Visit: Payer: Self-pay | Admitting: Cardiology

## 2023-03-01 ENCOUNTER — Other Ambulatory Visit (HOSPITAL_BASED_OUTPATIENT_CLINIC_OR_DEPARTMENT_OTHER): Payer: Self-pay

## 2023-03-01 MED ORDER — ATORVASTATIN CALCIUM 80 MG PO TABS
80.0000 mg | ORAL_TABLET | Freq: Every day | ORAL | 0 refills | Status: DC
  Filled 2023-03-01 (×2): qty 30, 30d supply, fill #0
  Filled 2023-04-14: qty 30, 30d supply, fill #1
  Filled 2023-05-17: qty 30, 30d supply, fill #2

## 2023-03-12 ENCOUNTER — Ambulatory Visit (INDEPENDENT_AMBULATORY_CARE_PROVIDER_SITE_OTHER): Payer: 59 | Admitting: Pulmonary Disease

## 2023-03-12 ENCOUNTER — Encounter (HOSPITAL_BASED_OUTPATIENT_CLINIC_OR_DEPARTMENT_OTHER): Payer: Self-pay | Admitting: Pulmonary Disease

## 2023-03-12 VITALS — BP 128/80 | HR 76 | Resp 16 | Ht 72.0 in | Wt 273.8 lb

## 2023-03-12 DIAGNOSIS — G4733 Obstructive sleep apnea (adult) (pediatric): Secondary | ICD-10-CM | POA: Diagnosis not present

## 2023-03-12 DIAGNOSIS — J984 Other disorders of lung: Secondary | ICD-10-CM

## 2023-03-12 NOTE — Patient Instructions (Signed)
OSA The natural history, progression and prognosis of sleep apnea, treatment with PAP and alternative treatment strategies were discussed. The patient was also educated regarding the long term cardiovascular benefits of treating sleep apnea, including improved blood pressure control, reduction in MI and stroke risk as well as other potential benefits of treatment, such as improved glycemic control, facilitation of weight loss, improved energy during the day and improved sleep quality. --Counseled on sleep hygiene --Counseled on weight loss/maintenance of healthy weight --Counseled NOT to drive if/when sleepy --Advised patient to wear CPAP for at least 4 hours each night for greater than 70% of the time to avoid the machine being repossessed by insurance.

## 2023-03-12 NOTE — Progress Notes (Signed)
Subjective:   PATIENT ID: Joshua Brady GENDER: male DOB: 27-Jan-1976, MRN: [id number]  Chief Complaint  Patient presents with   Consult    OSA- still hurts to breath sneeze but not as labored. He does need an occasional nap. Wakes up still in night no consistency.     Reason for Visit: Follow-up OSA  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  Initial consult S/p mediastinal mass resection 06/2021 c/b by diaphragm elevation. He has had shortness of breath before his surgery but more discomfort after surgery. Will have some coughing. Feels chest congestion but cough non-productive. Denies wheezing. Will get tired more easily and will nap in the afternoons. Last sleep test negative except during REM - not currently on CPAP. Difficulty focusing recently. Denies formal diagnosis of depression but feels he does feel down related to his work. Considering applying for disability. Denies SI/HI  12/07/22 Since our last visit he reports his shortness of breath is unchanged but not limiting. Denies cough or wheezing. He is more concerned about his naps and feeling fatigued. He has been seen by CTS with Dr. Dorris Fetch for elevated left hemidiaphragm, no surgical intervention recommended.  03/12/23 Since our last visit his CPAP was adjusted to CPAP 10-20 cm H20. He reports wearing his CPAP when he can but has busy days and forgets and falls asleep. Will sometimes will take off at night. Denies shortness of breath, cough or wheezing. Working in his shop daily. Has lost 12 lbs by reducing carb and sugar intake. No dedicated exercise.  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   Low testosterone 04/29/2018   Lumbar degenerative disc disease 02/06/2020   Palpitations 03/03/2018   Sleep apnea 03/11/2018   SOB (shortness of breath) on exertion 03/03/2018   Viral illness 09/11/2019     Family History  Problem Relation Age of Onset   Alcohol abuse Father    Myelodysplastic syndrome Father    Other Neg Hx        low testosterone     Social History   Occupational History   Not on file  Tobacco Use   Smoking status: Never    Passive exposure: Never   Smokeless tobacco: Never  Vaping Use   Vaping status: Never Used  Substance and Sexual Activity   Alcohol use: Yes    Comment: social   Drug use: No   Sexual activity: Yes    Birth control/protection: None    Allergies  Allergen Reactions   Other     Ants - see notes from ER visit 02/2018   Prednisone    Topiramate     Anxiety, dizzy, depressed, suicidal thoughts, agitated     Outpatient Medications Prior to Visit  Medication Sig Dispense Refill   atorvastatin (LIPITOR) 80 MG tablet Take 1  tablet (80 mg total) by mouth daily. *Need appointment for future refills.* 90 tablet 0   buPROPion (WELLBUTRIN XL) 150 MG 24 hr tablet Take 1 tablet (150 mg total) by mouth daily. 90 tablet 3   carvedilol (COREG) 6.25 MG tablet Take 1 tablet (6.25 mg total) by mouth 2 (two) times daily with a meal. 180 tablet 2   Continuous Glucose Sensor (FREESTYLE LIBRE 3 SENSOR) MISC Place 1 sensor on the skin every 14 days. Use to check glucose continuously. 2 each 4   No facility-administered medications prior to visit.    Review of Systems  Constitutional:  Negative for chills, diaphoresis, fever, malaise/fatigue and  weight loss.  HENT:  Negative for congestion.   Respiratory:  Positive for shortness of breath. Negative for cough, hemoptysis, sputum production and wheezing.   Cardiovascular:  Negative for chest pain, palpitations and leg swelling.     Objective:   Vitals:   03/12/23 0843  BP: 128/80  Pulse: 76  Resp: 16  SpO2: 97%  Weight: 273 lb 12.8 oz (124.2 kg)  Height: 6' (1.829 m)    SpO2: 97 %  Physical Exam: General: Well-appearing, no acute distress HENT: [place], AT Eyes: EOMI, no scleral icterus Respiratory: Clear to auscultation bilaterally.  No crackles, wheezing or rales Cardiovascular: RRR, -M/R/G, no JVD Extremities:-Edema,-tenderness Neuro: AAO x4, CNII-XII grossly intact Psych: Normal mood, normal affect  Data Reviewed:  Imaging: CXR 10/02/22 - Left hemidiaphragm elevation with small left pleural effusion/atelectasis CXR 12/08/2022-chronic elevation of left diaphragm  PFT: 11/30/22 FVC 2.77 (50%) FEV1 1.42 (33%) Ratio 51  TLC 66% DLCO 87% Interpretation: Very severe restrictive defect +/- effort. Severe airflow limitation on spirometry with significant bronchodilator response. F- V difficult to discern obstructive defect. Clinically correlate   Labs: CBC    Component Value Date/Time   WBC 9.7 06/07/2022 1240   RBC 4.80 06/07/2022 1240   HGB 13.3 06/07/2022 1240   HCT 40.8 06/07/2022 1240   PLT 274 06/07/2022 1240   MCV 85.0 06/07/2022 1240   MCH 27.7 06/07/2022 1240   MCHC 32.6 06/07/2022 1240   RDW 12.8 06/07/2022 1240   LYMPHSABS 2.5 06/15/2021 1920   MONOABS 0.9 06/15/2021 1920   EOSABS 0.2 06/15/2021 1920   BASOSABS 0.0 06/15/2021 1920   Absolute eos 06/15/21 -200  Sleep: PSG 04/21/18 - 3.3/h, moderate AHI 16.6/h during REM Split night 12/07/22 - AHI 21.3, moderate OSA. Nadir SpO2 87%. 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 Improved DOE. Counseled shortness of breath likely multifactorial in setting of suboptimally treated OSA, deconditioning, left diaphragm elevation. Not a surgical candidate for his diaphragm.     Shortness of breath Severe restrictive defect --PFTs reviewed at last visit. May be inaccurate as clinical symptoms not consistent with obstructive. Though restrictive defect would clinically correlate --No bronchodilators for now  OSA The natural history, progression and prognosis of sleep apnea, treatment with PAP and alternative treatment strategies were discussed. The patient was also educated regarding the long term cardiovascular benefits of treating sleep apnea, including improved blood pressure control, reduction in MI and stroke risk as well as other potential benefits of treatment, such as improved glycemic control, facilitation of weight loss, improved energy  during the day and improved sleep quality. --Counseled on sleep hygiene --Counseled on weight loss/maintenance of healthy weight --Counseled NOT to drive if/when sleepy --Advised patient to wear CPAP for at least 4 hours each night for greater than 70% of the time to avoid the machine being repossessed by insurance.  Health Maintenance Immunization History  Administered Date(s) Administered   Influenza-Unspecified 02/04/2017   Tdap 11/01/2015   CT Lung Screen- insufficient tobacco history  No orders of the defined types were placed in this encounter. No orders of the defined types were placed in this encounter.   Return in about 6 months (around 09/09/2023).  I have spent a total time of 31-minutes on the day of the appointment including chart review, data review, collecting history, coordinating care and discussing medical diagnosis and plan with the  patient/family. Past medical history, allergies, medications were reviewed. Pertinent imaging, labs and tests included in this note have been reviewed and interpreted independently by me.  Joshua Cona Mechele Collin, MD [facility] 03/12/2023 9:06 AM  Office Number [phone]

## 2023-03-17 ENCOUNTER — Other Ambulatory Visit (HOSPITAL_BASED_OUTPATIENT_CLINIC_OR_DEPARTMENT_OTHER): Payer: Self-pay

## 2023-03-17 ENCOUNTER — Other Ambulatory Visit: Payer: Self-pay

## 2023-03-18 ENCOUNTER — Other Ambulatory Visit: Payer: Self-pay | Admitting: Family Medicine

## 2023-03-18 ENCOUNTER — Encounter: Payer: Self-pay | Admitting: Family Medicine

## 2023-03-19 ENCOUNTER — Other Ambulatory Visit (HOSPITAL_BASED_OUTPATIENT_CLINIC_OR_DEPARTMENT_OTHER): Payer: Self-pay

## 2023-03-19 MED ORDER — BUPROPION HCL ER (XL) 300 MG PO TB24
300.0000 mg | ORAL_TABLET | Freq: Every day | ORAL | 1 refills | Status: DC
  Filled 2023-03-19: qty 30, 30d supply, fill #0
  Filled 2023-04-14: qty 30, 30d supply, fill #1
  Filled 2023-05-17: qty 30, 30d supply, fill #2
  Filled 2023-06-12: qty 30, 30d supply, fill #3
  Filled 2023-07-12: qty 30, 30d supply, fill #4
  Filled 2023-08-16: qty 30, 30d supply, fill #5

## 2023-03-19 NOTE — Telephone Encounter (Signed)
Updated rx sent to [facility]

## 2023-04-15 ENCOUNTER — Other Ambulatory Visit (HOSPITAL_BASED_OUTPATIENT_CLINIC_OR_DEPARTMENT_OTHER): Payer: Self-pay

## 2023-04-16 ENCOUNTER — Other Ambulatory Visit (HOSPITAL_BASED_OUTPATIENT_CLINIC_OR_DEPARTMENT_OTHER): Payer: Self-pay

## 2023-05-17 ENCOUNTER — Other Ambulatory Visit (HOSPITAL_BASED_OUTPATIENT_CLINIC_OR_DEPARTMENT_OTHER): Payer: Self-pay

## 2023-05-18 ENCOUNTER — Other Ambulatory Visit (HOSPITAL_BASED_OUTPATIENT_CLINIC_OR_DEPARTMENT_OTHER): Payer: Self-pay

## 2023-06-03 ENCOUNTER — Encounter: Payer: Self-pay | Admitting: Family Medicine

## 2023-06-03 ENCOUNTER — Ambulatory Visit (INDEPENDENT_AMBULATORY_CARE_PROVIDER_SITE_OTHER): Payer: 59 | Admitting: Family Medicine

## 2023-06-03 VITALS — BP 133/85 | HR 73 | Ht 72.0 in | Wt 270.0 lb

## 2023-06-03 DIAGNOSIS — E119 Type 2 diabetes mellitus without complications: Secondary | ICD-10-CM | POA: Diagnosis not present

## 2023-06-03 DIAGNOSIS — I10 Essential (primary) hypertension: Secondary | ICD-10-CM | POA: Diagnosis not present

## 2023-06-03 DIAGNOSIS — I2510 Atherosclerotic heart disease of native coronary artery without angina pectoris: Secondary | ICD-10-CM

## 2023-06-03 DIAGNOSIS — F4321 Adjustment disorder with depressed mood: Secondary | ICD-10-CM

## 2023-06-03 LAB — POCT GLYCOSYLATED HEMOGLOBIN (HGB A1C): HbA1c, POC (controlled diabetic range): 6.1 % (ref 0.0–7.0)

## 2023-06-03 NOTE — Progress Notes (Signed)
Joshua Brady - 47 y.o. male MRN [id number]  Date of birth: Nov 19, 1975  Subjective Chief Complaint  Patient presents with   Medical Management of Chronic Issues    HPI Joshua Brady is a 47 y.o. male here today for follow up visit.    He reports that he is doing pretty well.  He has    Working on dietary changes to improve glucose.   Weight is down an additional 3 lbs since last visit.  A1c improved to 6.1%  Bupropion seems to be quite effective for him.  Energy levels are better and feels better overall.  More motivated to do things he enjoys.   BP stable.  Remains on Coreg.  Denies new/worsening chest pain.    ROS:  A comprehensive ROS was completed and negative except as noted per HPI   Allergies  Allergen Reactions   Other     Ants - see notes from ER visit 02/2018   Prednisone    Topiramate     Anxiety, dizzy, depressed, suicidal thoughts, agitated    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   Low testosterone 04/29/2018   Lumbar degenerative disc disease 02/06/2020   Palpitations 03/03/2018   Sleep apnea 03/11/2018   SOB (shortness of breath) on exertion 03/03/2018   Viral illness 09/11/2019    Past Surgical History:  Procedure Laterality Date   Broken Arm     LEFT HEART CATH AND CORONARY ANGIOGRAPHY N/A 11/22/2020   Procedure: LEFT HEART CATH AND CORONARY ANGIOGRAPHY;  Surgeon: Lennette Bihari, MD;  Location: [facility];  Service: Cardiovascular;  Laterality: N/A;   LIVER BIOPSY      Social History   Socioeconomic History   Marital status: Married     Spouse name: [name]   Number of children: 2   Years of education: Not on file   Highest education level: Not on file  Occupational History   Not on file  Tobacco Use   Smoking status: Never    Passive exposure: Never   Smokeless tobacco: Never  Vaping Use   Vaping status: Never Used  Substance and Sexual Activity   Alcohol use: Yes    Comment: social   Drug use: No   Sexual activity: Yes    Birth control/protection: None  Other Topics Concern   Not on file  Social History Narrative   Not on file   Social Drivers of Health   Financial Resource Strain: Not on file  Food Insecurity: Not on file  Transportation Needs: Not on file  Physical Activity: Not on file  Stress: Not on file  Social Connections: Unknown (10/28/2021)   [facility]   Social Network    Social Network: Not on file    Family History  Problem Relation Age of Onset   Alcohol abuse Father    Myelodysplastic syndrome Father    Other Neg Hx        low testosterone    Health Maintenance  Topic Date Due  OPHTHALMOLOGY EXAM  Never done   Hepatitis C Screening  Never done   COVID-19 Vaccine (1 - 2024-25 season) Never done   Diabetic kidney evaluation - eGFR measurement  06/08/2023   INFLUENZA VACCINE  09/13/2023 (Originally 01/14/2023)   Colonoscopy  06/02/2024 (Originally 08/28/2018)   Diabetic kidney evaluation - Urine ACR  10/02/2023   FOOT EXAM  10/02/2023   HEMOGLOBIN A1C  12/02/2023   DTaP/Tdap/Td (2 - Td or Tdap) 10/31/2025   HIV Screening  Completed   HPV VACCINES  Aged Out     ----------------------------------------------------------------------------------------------------------------------------------------------------------------------------------------------------------------- Physical Exam BP 133/85 (BP Location: Left Arm, Patient Position: Sitting, Cuff Size: Large)   Pulse 73   Ht 6' (1.829 m)   Wt 270 lb (122.5 kg)   SpO2 97%   BMI  36.62 kg/m   Physical Exam Constitutional:      Appearance: Normal appearance.  Eyes:     General: No scleral icterus. Cardiovascular:     Rate and Rhythm: Normal rate and regular rhythm.  Pulmonary:     Effort: Pulmonary effort is normal.     Breath sounds: Normal breath sounds.  Musculoskeletal:     Cervical back: Neck supple.  Neurological:     Mental Status: He is alert.  Psychiatric:        Mood and Affect: Mood normal.        Behavior: Behavior normal.     ------------------------------------------------------------------------------------------------------------------------------------------------------------------------------------------------------------------- Assessment and Plan  Hypertension Blood pressure remains well-controlled.  Continue carvedilol at current strength.  Type 2 diabetes mellitus without complication, without long-term current use of insulin (HCC) A1c in back in the pre-diabetes range.  Doing well with dietary and lifestyle changes.    Adjustment disorder with depressed mood Overall he is doing well with bupropion.  Will continue at current strength.     No orders of the defined types were placed in this encounter.   Return in about 6 months (around 12/02/2023) for Type 2 Diabetes.    This visit occurred during the SARS-CoV-2 public health emergency.  Safety protocols were in place, including screening questions prior to the visit, additional usage of staff PPE, and extensive cleaning of exam room while observing appropriate contact time as indicated for disinfecting solutions.

## 2023-06-03 NOTE — Assessment & Plan Note (Signed)
Blood pressure remains well-controlled.  Continue carvedilol at current strength.

## 2023-06-03 NOTE — Assessment & Plan Note (Signed)
A1c in back in the pre-diabetes range.  Doing well with dietary and lifestyle changes.

## 2023-06-03 NOTE — Assessment & Plan Note (Signed)
Overall he is doing well with bupropion.  Will continue at current strength.

## 2023-06-12 ENCOUNTER — Other Ambulatory Visit: Payer: Self-pay | Admitting: Cardiology

## 2023-06-14 ENCOUNTER — Other Ambulatory Visit (HOSPITAL_BASED_OUTPATIENT_CLINIC_OR_DEPARTMENT_OTHER): Payer: Self-pay

## 2023-06-30 ENCOUNTER — Encounter: Payer: Self-pay | Admitting: Family Medicine

## 2023-07-12 ENCOUNTER — Other Ambulatory Visit (HOSPITAL_BASED_OUTPATIENT_CLINIC_OR_DEPARTMENT_OTHER): Payer: Self-pay

## 2023-07-12 ENCOUNTER — Other Ambulatory Visit: Payer: Self-pay | Admitting: Cardiology

## 2023-07-12 ENCOUNTER — Encounter: Payer: Self-pay | Admitting: Family Medicine

## 2023-07-12 MED ORDER — ATORVASTATIN CALCIUM 80 MG PO TABS
80.0000 mg | ORAL_TABLET | Freq: Every day | ORAL | 0 refills | Status: DC
  Filled 2023-07-12: qty 15, 15d supply, fill #0

## 2023-07-12 NOTE — Telephone Encounter (Signed)
RX sent

## 2023-07-19 ENCOUNTER — Other Ambulatory Visit (HOSPITAL_BASED_OUTPATIENT_CLINIC_OR_DEPARTMENT_OTHER): Payer: Self-pay

## 2023-08-16 ENCOUNTER — Other Ambulatory Visit (HOSPITAL_BASED_OUTPATIENT_CLINIC_OR_DEPARTMENT_OTHER): Payer: Self-pay

## 2023-08-16 ENCOUNTER — Other Ambulatory Visit: Payer: Self-pay | Admitting: Cardiology

## 2023-08-16 MED ORDER — ATORVASTATIN CALCIUM 80 MG PO TABS
80.0000 mg | ORAL_TABLET | Freq: Every day | ORAL | 0 refills | Status: DC
  Filled 2023-08-16: qty 15, 15d supply, fill #0

## 2023-08-24 ENCOUNTER — Other Ambulatory Visit (HOSPITAL_BASED_OUTPATIENT_CLINIC_OR_DEPARTMENT_OTHER): Payer: Self-pay

## 2023-09-01 ENCOUNTER — Encounter: Payer: Self-pay | Admitting: Family Medicine

## 2023-09-01 ENCOUNTER — Other Ambulatory Visit: Payer: Self-pay | Admitting: Family Medicine

## 2023-09-01 ENCOUNTER — Other Ambulatory Visit (HOSPITAL_BASED_OUTPATIENT_CLINIC_OR_DEPARTMENT_OTHER): Payer: Self-pay

## 2023-09-01 ENCOUNTER — Other Ambulatory Visit: Payer: Self-pay

## 2023-09-01 MED ORDER — BUPROPION HCL ER (XL) 300 MG PO TB24
300.0000 mg | ORAL_TABLET | Freq: Every day | ORAL | 1 refills | Status: DC
  Filled 2023-09-01: qty 90, 90d supply, fill #0
  Filled 2023-12-18: qty 90, 90d supply, fill #1

## 2023-09-03 ENCOUNTER — Other Ambulatory Visit (HOSPITAL_BASED_OUTPATIENT_CLINIC_OR_DEPARTMENT_OTHER): Payer: Self-pay

## 2023-09-06 ENCOUNTER — Ambulatory Visit (INDEPENDENT_AMBULATORY_CARE_PROVIDER_SITE_OTHER): Payer: Self-pay | Admitting: Medical-Surgical

## 2023-09-06 ENCOUNTER — Encounter: Payer: Self-pay | Admitting: Medical-Surgical

## 2023-09-06 ENCOUNTER — Other Ambulatory Visit (HOSPITAL_BASED_OUTPATIENT_CLINIC_OR_DEPARTMENT_OTHER): Payer: Self-pay

## 2023-09-06 ENCOUNTER — Ambulatory Visit: Payer: Self-pay

## 2023-09-06 VITALS — BP 157/96 | HR 95 | Temp 98.1°F | Resp 20 | Ht 72.0 in | Wt 267.0 lb

## 2023-09-06 DIAGNOSIS — R052 Subacute cough: Secondary | ICD-10-CM

## 2023-09-06 MED ORDER — METHYLPREDNISOLONE ACETATE 80 MG/ML IJ SUSP
80.0000 mg | Freq: Once | INTRAMUSCULAR | Status: AC
  Administered 2023-09-06: 80 mg via INTRAMUSCULAR

## 2023-09-06 MED ORDER — ALBUTEROL SULFATE HFA 108 (90 BASE) MCG/ACT IN AERS
2.0000 | INHALATION_SPRAY | Freq: Four times a day (QID) | RESPIRATORY_TRACT | 0 refills | Status: AC | PRN
  Filled 2023-09-06: qty 6.7, 25d supply, fill #0

## 2023-09-06 NOTE — Progress Notes (Unsigned)
 Subjective:  Patient ID: Joshua Brady, male    DOB: 1975/10/21, 48 y.o.   MRN: [id number]  Patient Care Team: [name], DO as PCP - General (Family Medicine) Georgeanna Lea, MD as PCP - Cardiology (Cardiology)   Chief Complaint:  No chief complaint on file.   HPI:  Joshua Brady is a 48 y.o. male presenting on 09/06/2023 for No chief complaint on file.   History, Exam,  Impression and Plan   1. Subacute cough (Primary) Presents with 3-week history of upper respiratory symptoms that have progressed  from sinus congestion and rhinorhea to dry persistent and nonproductive cough.  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  On exam patient is observed to be in no apparent respiratory distress but does have intermittent dry nonproductive cough. TMs are clear bilaterally and bilateral external ear without drainage or erythema.  Negative cervical lymphedema.  Oropharynx is pink and moist without edema or exudate. Lungs are clear but left lower lobe is diminished consistent with history of paralyzed left diaphragm. No other adventitious lung sounds.  Heart rhythm is regular and S1-S2 heard without murmur or gallop.  Discussed plan of care with patient and he is amenable to chest x-ray and albuterol inhaler and waiting to see what chest x-ray shows before starting antibiotic therapy.       -     DG Chest 2 View; Future -     albuterol (VENTOLIN HFA) 108 (90 Base) MCG/ACT inhaler; Inhale 2 puffs into the lungs every 6 (six) hours as needed for wheezing or shortness of breath.   Continue all other maintenance medications.  Follow up plan: Return in about 4 weeks (around 10/04/2023), or if symptoms worsen or fail to improve.  Will notify patient of radiograph results and any changes to plan of care.  Relevant past medical, surgical, family, and social history reviewed and updated as  indicated.  Allergies and medications reviewed and updated. Data reviewed: Chart in Epic.   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   Low testosterone 04/29/2018   Lumbar degenerative disc disease 02/06/2020   Palpitations 03/03/2018   Sleep apnea 03/11/2018   SOB (shortness of breath) on exertion 03/03/2018   Viral illness 09/11/2019    Past Surgical History:  Procedure Laterality Date   Broken Arm     LEFT HEART CATH AND CORONARY ANGIOGRAPHY N/A 11/22/2020   Procedure: LEFT HEART CATH AND CORONARY ANGIOGRAPHY;  Surgeon: Lennette Bihari, MD;  Location: [facility];  Service: Cardiovascular;  Laterality: N/A;   LIVER BIOPSY      Social History   Socioeconomic History   Marital status: Married    Spouse name: Joshua Brady   Number of children: 2   Years of education: Not on file   Highest education level: 12th grade  Occupational History   Not on file  Tobacco Use   Smoking status: Never    Passive  exposure: Never   Smokeless tobacco: Never  Vaping Use   Vaping status: Never Used  Substance and Sexual Activity   Alcohol use: Yes    Comment: social   Drug use: No   Sexual activity: Yes    Birth control/protection: None  Other Topics Concern   Not on file  Social History Narrative   Not on file   Social Drivers of Health   Financial Resource Strain: Low Risk  (09/05/2023)   Overall Financial Resource Strain (CARDIA)    Difficulty of Paying Living Expenses: Not hard at all  Food Insecurity: Food Insecurity Present (09/05/2023)   Hunger Vital Sign     Worried About Running Out of Food in the Last Year: Sometimes true    Ran Out of Food in the Last Year: Never true  Transportation Needs: No Transportation Needs (09/05/2023)   PRAPARE - Administrator, Civil Service (Medical): No    Lack of Transportation (Non-Medical): No  Physical Activity: Sufficiently Active (09/05/2023)   Exercise Vital Sign    Days of Exercise per Week: 4 days    Minutes of Exercise per Session: 60 min  Stress: No Stress Concern Present (09/05/2023)   [employer] of Occupational Health - Occupational Stress Questionnaire    Feeling of Stress : Not at all  Social Connections: Moderately Isolated (09/05/2023)   Social Connection and Isolation Panel [NHANES]    Frequency of Communication with Friends and Family: Once a week    Frequency of Social Gatherings with Friends and Family: Once a week    Attends Religious Services: More than 4 times per year    Active Member of [employer] or Organizations: No    Attends Engineer, structural: Not on file    Marital Status: Married  Intimate Partner Violence: Unknown (09/18/2021)   Received from [employer], [facility]   HITS    Physically Hurt: Not on file    Insult or Talk Down To: Not on file    Threaten Physical Harm: Not on file    Scream or Curse: Not on file    Outpatient Encounter Medications as of 09/06/2023  Medication Sig   albuterol (VENTOLIN HFA) 108 (90 Base) MCG/ACT inhaler Inhale 2 puffs into the lungs every 6 (six) hours as needed for wheezing or shortness of breath.   atorvastatin (LIPITOR) 80 MG tablet Take 1 tablet (80 mg total) by mouth daily. Patient needs an appointment for further refills. 3 rd/final attempt   buPROPion (WELLBUTRIN XL) 300 MG 24 hr tablet Take 1 tablet (300 mg total) by mouth daily.   carvedilol (COREG) 6.25 MG tablet Take 1 tablet (6.25 mg total) by mouth 2 (two) times daily with a meal.   Continuous Glucose Sensor (FREESTYLE LIBRE 3 SENSOR) MISC Place 1  sensor on the skin every 14 days. Use to check glucose continuously.   No facility-administered encounter medications on file as of 09/06/2023.    Allergies  Allergen Reactions   Other     Ants - see notes from ER visit 02/2018   Prednisone     Suicidal, angry, increased libido   Topiramate     Anxiety, dizzy, depressed, suicidal thoughts, agitated    Review of Systems      Objective:  BP (!) 157/96 (BP Location: Right Arm, Cuff Size: Large)   Pulse 95   Temp 98.1 F (36.7 C) (Oral)   Resp 20   Ht 6' (1.829 m)   Wt 267 lb (  121.1 kg)   SpO2 97%   BMI 36.21 kg/m    Wt Readings from Last 3 Encounters:  09/06/23 267 lb (121.1 kg)  06/03/23 270 lb (122.5 kg)  03/12/23 273 lb 12.8 oz (124.2 kg)    Physical Exam  Results for orders placed or performed in visit on 06/03/23  POCT HgB A1C   Collection Time: 06/03/23  9:02 AM  Result Value Ref Range   Hemoglobin A1C     HbA1c POC (<> result, manual entry)     HbA1c, POC (prediabetic range)     HbA1c, POC (controlled diabetic range) 6.1 0.0 - 7.0 %       Pertinent labs & imaging results that were available during my care of the patient were reviewed by me and considered in my medical decision making.   Continue healthy lifestyle choices, including diet (rich in fruits, vegetables, and lean proteins, and low in salt and simple carbohydrates) and exercise (at least 30 minutes of moderate physical activity daily).   The above assessment and management plan was discussed with the patient. The patient verbalized understanding of and has agreed to the management plan. Patient is aware to call the clinic if they develop any new symptoms or if symptoms persist or worsen. Patient is aware when to return to the clinic for a follow-up visit. Patient educated on when it is appropriate to go to the emergency department.   Maryelizabeth Kaufmann Student AGNP

## 2023-09-06 NOTE — Progress Notes (Unsigned)
 Medical screening examination/treatment was performed by qualified clinical staff member and as supervising provider I was immediately available for consultation/collaboration. I have reviewed documentation and agree with assessment and plan.  Thayer Ohm, DNP, APRN, FNP-BC [facility]

## 2023-12-02 ENCOUNTER — Ambulatory Visit (INDEPENDENT_AMBULATORY_CARE_PROVIDER_SITE_OTHER): Payer: 59 | Admitting: Family Medicine

## 2023-12-02 ENCOUNTER — Encounter: Payer: Self-pay | Admitting: Family Medicine

## 2023-12-02 VITALS — BP 122/84 | HR 80 | Ht 72.0 in | Wt 259.0 lb

## 2023-12-02 DIAGNOSIS — E119 Type 2 diabetes mellitus without complications: Secondary | ICD-10-CM

## 2023-12-02 DIAGNOSIS — E785 Hyperlipidemia, unspecified: Secondary | ICD-10-CM

## 2023-12-02 DIAGNOSIS — R002 Palpitations: Secondary | ICD-10-CM

## 2023-12-02 DIAGNOSIS — F4321 Adjustment disorder with depressed mood: Secondary | ICD-10-CM

## 2023-12-02 DIAGNOSIS — I10 Essential (primary) hypertension: Secondary | ICD-10-CM

## 2023-12-02 DIAGNOSIS — E789 Disorder of lipoprotein metabolism, unspecified: Secondary | ICD-10-CM

## 2023-12-02 LAB — POCT GLYCOSYLATED HEMOGLOBIN (HGB A1C): HbA1c, POC (controlled diabetic range): 5.7 % (ref 0.0–7.0)

## 2023-12-02 NOTE — Assessment & Plan Note (Signed)
 A1c in back in the pre-diabetes range.  Doing well with dietary and lifestyle changes.

## 2023-12-02 NOTE — Progress Notes (Signed)
 Joshua Brady - 48 y.o. male MRN [id number]  Date of birth: May 17, 1976  Subjective Chief Complaint  Patient presents with   Diabetes   Hypertension    HPI Joshua Brady is a 48 y.o. male here today for follow up visit.   History of T2DM.  Currently managing with dietary changes alone.  A1c today is 5.7%.   Continues on atorvastatin  for management of lipids.   HTN and palpitations stable with dietary changes.  Off of coreg  at this time  No new or worsening chest pain, dyspnea, headache or vision changes.   Reports some increased anxiety.  He is taking bupropion  for mood and depression which has worked well for him.  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    ROS:  A comprehensive ROS was completed and negative except as noted per HPI  Allergies  Allergen Reactions   Other     Ants - see notes from ER visit 02/2018   Prednisone      Suicidal, angry, increased libido   Topiramate     Anxiety, dizzy, depressed, suicidal thoughts, agitated    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   Low testosterone  04/29/2018   Lumbar degenerative disc disease 02/06/2020   Palpitations 03/03/2018   Sleep apnea 03/11/2018   SOB (shortness of breath) on exertion 03/03/2018   Viral illness 09/11/2019    Past Surgical History:  Procedure Laterality Date   Broken Arm     LEFT HEART CATH AND CORONARY ANGIOGRAPHY N/A 11/22/2020   Procedure: LEFT HEART CATH AND CORONARY ANGIOGRAPHY;  Surgeon: Millicent Ally, MD;  Location: [facility];   Service: Cardiovascular;  Laterality: N/A;   LIVER BIOPSY      Social History   Socioeconomic History   Marital status: Married    Spouse name: Alexsis Branscom   Number of children: 2   Years of education: Not on file   Highest education level: 12th grade  Occupational History   Not on file  Tobacco Use   Smoking status: Never    Passive exposure: Never   Smokeless tobacco: Never  Vaping Use   Vaping status: Never Used  Substance and Sexual Activity   Alcohol use: Yes    Comment: social   Drug use: No   Sexual activity: Yes    Birth control/protection: None  Other Topics Concern   Not on file  Social History Narrative   Not on file   Social Drivers of Health   Financial Resource Strain: Low Risk  (09/05/2023)   Overall Financial Resource Strain (CARDIA)    Difficulty of Paying Living Expenses: Not hard at all  Food Insecurity: Food Insecurity Present (09/05/2023)   Hunger Vital Sign    Worried About Running Out of Food in the Last Year: Sometimes true    Ran Out of Food in the Last Year: Never true  Transportation Needs: No Transportation Needs (09/05/2023)   PRAPARE - Administrator, Civil Service (Medical):  No    Lack of Transportation (Non-Medical): No  Physical Activity: Sufficiently Active (09/05/2023)   Exercise Vital Sign    Days of Exercise per Week: 4 days    Minutes of Exercise per Session: 60 min  Stress: No Stress Concern Present (09/05/2023)   [employer] of Occupational Health - Occupational Stress Questionnaire    Feeling of Stress : Not at all  Social Connections: Moderately Isolated (09/05/2023)   Social Connection and Isolation Panel    Frequency of Communication with Friends and Family: Once a week    Frequency of Social Gatherings with Friends and Family: Once a week    Attends Religious Services: More than 4 times per year    Active Member of Clubs or Organizations: No    Attends Engineer, structural: Not on file     Marital Status: Married    Family History  Problem Relation Age of Onset   Alcohol abuse Father    Myelodysplastic syndrome Father    Other Neg Hx        low testosterone     Health Maintenance  Topic Date Due   OPHTHALMOLOGY EXAM  Never done   Hepatitis C Screening  Never done   Pneumococcal Vaccine 97-2 Years old (1 of 2 - PCV) Never done   Diabetic kidney evaluation - eGFR measurement  06/08/2023   Diabetic kidney evaluation - Urine ACR  10/02/2023   Zoster Vaccines- Shingrix (1 of 2) 03/03/2024 (Originally 08/28/2023)   COVID-19 Vaccine (1 - 2024-25 season) 05/19/2024 (Originally 02/14/2023)   Colonoscopy  06/02/2024 (Originally 08/28/2018)   INFLUENZA VACCINE  01/14/2024   HEMOGLOBIN A1C  06/02/2024   FOOT EXAM  12/01/2024   DTaP/Tdap/Td (2 - Td or Tdap) 10/31/2025   HIV Screening  Completed   HPV VACCINES  Aged Out   Meningococcal B Vaccine  Aged Out     ----------------------------------------------------------------------------------------------------------------------------------------------------------------------------------------------------------------- Physical Exam BP 122/84 (BP Location: Left Arm, Patient Position: Sitting, Cuff Size: Large)   Pulse 80   Ht 6' (1.829 m)   Wt 259 lb (117.5 kg)   SpO2 97%   BMI 35.13 kg/m   Physical Exam Constitutional:      Appearance: Normal appearance.   Eyes:     General: No scleral icterus.   Cardiovascular:     Rate and Rhythm: Normal rate and regular rhythm.  Pulmonary:     Effort: Pulmonary effort is normal.     Breath sounds: Normal breath sounds.   Musculoskeletal:     Cervical back: Neck supple.   Neurological:     General: No focal deficit present.     Mental Status: He is alert.   Psychiatric:        Mood and Affect: Mood normal.        Behavior: Behavior normal.      ------------------------------------------------------------------------------------------------------------------------------------------------------------------------------------------------------------------- Assessment and Plan  Hypertension Blood pressure remains well-controlled.  No longer taking coreg .   Dyslipidemia Doing well with atorvastatin  at current strength.  Updating lipid panel.    Type 2 diabetes mellitus without complication, without long-term current use of insulin (HCC) A1c in back in the pre-diabetes range.  Doing well with dietary and lifestyle changes.    Adjustment disorder with depressed mood Overall doing well with bupropion  but notes some increased anxiety.  Discussed adding magnesium glcyinate.  We also discussed possibly adding buspar if this isn't helpful   No orders of the defined types were placed in this encounter.   Return in about 6 months (  around 06/02/2024) for Type 2 Diabetes.

## 2023-12-02 NOTE — Patient Instructions (Signed)
 Try adding magnesium glycinate 250-400mg  nightly.

## 2023-12-02 NOTE — Assessment & Plan Note (Signed)
Doing well with atorvastatin at current strength.  Updating lipid panel.  

## 2023-12-02 NOTE — Assessment & Plan Note (Signed)
 Overall doing well with bupropion  but notes some increased anxiety.  Discussed adding magnesium glcyinate.  We also discussed possibly adding buspar if this isn't helpful

## 2023-12-02 NOTE — Assessment & Plan Note (Addendum)
 Blood pressure remains well-controlled.  No longer taking coreg .

## 2023-12-03 LAB — CMP14+EGFR
ALT: 34 IU/L (ref 0–44)
AST: 30 IU/L (ref 0–40)
Albumin: 4.6 g/dL (ref 4.1–5.1)
Alkaline Phosphatase: 108 IU/L (ref 44–121)
BUN/Creatinine Ratio: 17 (ref 9–20)
BUN: 17 mg/dL (ref 6–24)
Bilirubin Total: 0.4 mg/dL (ref 0.0–1.2)
CO2: 21 mmol/L (ref 20–29)
Calcium: 9.8 mg/dL (ref 8.7–10.2)
Chloride: 102 mmol/L (ref 96–106)
Creatinine, Ser: 0.98 mg/dL (ref 0.76–1.27)
Globulin, Total: 2.7 g/dL (ref 1.5–4.5)
Glucose: 93 mg/dL (ref 70–99)
Potassium: 5 mmol/L (ref 3.5–5.2)
Sodium: 139 mmol/L (ref 134–144)
Total Protein: 7.3 g/dL (ref 6.0–8.5)
eGFR: 94 mL/min/{1.73_m2} (ref >59)

## 2023-12-03 LAB — CBC WITH DIFFERENTIAL/PLATELET
Basophils Absolute: 0.1 10*3/uL (ref 0.0–0.2)
Basos: 1 %
EOS (ABSOLUTE): 0.1 10*3/uL (ref 0.0–0.4)
Eos: 1 %
Hematocrit: 46.1 % (ref 37.5–51.0)
Hemoglobin: 14.9 g/dL (ref 13.0–17.7)
Immature Grans (Abs): 0 10*3/uL (ref 0.0–0.1)
Immature Granulocytes: 0 %
Lymphocytes Absolute: 2.5 10*3/uL (ref 0.7–3.1)
Lymphs: 32 %
MCH: 28.7 pg (ref 26.6–33.0)
MCHC: 32.3 g/dL (ref 31.5–35.7)
MCV: 89 fL (ref 79–97)
Monocytes Absolute: 0.7 10*3/uL (ref 0.1–0.9)
Monocytes: 9 %
Neutrophils Absolute: 4.6 10*3/uL (ref 1.4–7.0)
Neutrophils: 57 %
Platelets: 271 10*3/uL (ref 150–450)
RBC: 5.2 x10E6/uL (ref 4.14–5.80)
RDW: 13.6 % (ref 11.6–15.4)
WBC: 7.9 10*3/uL (ref 3.4–10.8)

## 2023-12-03 LAB — LIPID PANEL
Chol/HDL Ratio: 7.2 ratio — ABNORMAL HIGH (ref 0.0–5.0)
Cholesterol, Total: 296 mg/dL — ABNORMAL HIGH (ref 100–199)
HDL: 41 mg/dL (ref >39)
LDL Chol Calc (NIH): 212 mg/dL — ABNORMAL HIGH (ref 0–99)
Triglycerides: 217 mg/dL — ABNORMAL HIGH (ref 0–149)
VLDL Cholesterol Cal: 43 mg/dL — ABNORMAL HIGH (ref 5–40)

## 2023-12-03 LAB — MICROALBUMIN / CREATININE URINE RATIO
Creatinine, Urine: 79.4 mg/dL
Microalb/Creat Ratio: 22 mg/g{creat} (ref 0–29)
Microalbumin, Urine: 17.2 ug/mL

## 2023-12-10 ENCOUNTER — Ambulatory Visit: Payer: Self-pay | Admitting: Family Medicine

## 2023-12-20 ENCOUNTER — Other Ambulatory Visit (HOSPITAL_BASED_OUTPATIENT_CLINIC_OR_DEPARTMENT_OTHER): Payer: Self-pay

## 2024-03-02 ENCOUNTER — Encounter: Payer: Self-pay | Admitting: Family Medicine

## 2024-03-14 ENCOUNTER — Other Ambulatory Visit (HOSPITAL_BASED_OUTPATIENT_CLINIC_OR_DEPARTMENT_OTHER): Payer: Self-pay

## 2024-03-14 ENCOUNTER — Other Ambulatory Visit: Payer: Self-pay | Admitting: Family Medicine

## 2024-03-14 MED ORDER — BUPROPION HCL ER (XL) 300 MG PO TB24
300.0000 mg | ORAL_TABLET | Freq: Every day | ORAL | 1 refills | Status: AC
  Filled 2024-03-14: qty 90, 90d supply, fill #0
  Filled 2024-06-26: qty 90, 90d supply, fill #1

## 2024-06-01 ENCOUNTER — Other Ambulatory Visit (HOSPITAL_BASED_OUTPATIENT_CLINIC_OR_DEPARTMENT_OTHER): Payer: Self-pay

## 2024-06-01 ENCOUNTER — Ambulatory Visit: Payer: Self-pay | Admitting: Family Medicine

## 2024-06-01 ENCOUNTER — Encounter: Payer: Self-pay | Admitting: Family Medicine

## 2024-06-01 VITALS — BP 126/85 | HR 75 | Ht 72.0 in | Wt 257.0 lb

## 2024-06-01 DIAGNOSIS — F4321 Adjustment disorder with depressed mood: Secondary | ICD-10-CM

## 2024-06-01 DIAGNOSIS — E119 Type 2 diabetes mellitus without complications: Secondary | ICD-10-CM

## 2024-06-01 DIAGNOSIS — E785 Hyperlipidemia, unspecified: Secondary | ICD-10-CM

## 2024-06-01 DIAGNOSIS — I10 Essential (primary) hypertension: Secondary | ICD-10-CM

## 2024-06-01 LAB — POCT GLYCOSYLATED HEMOGLOBIN (HGB A1C): HbA1c, POC (controlled diabetic range): 5.6 % (ref 0.0–7.0)

## 2024-06-01 MED ORDER — ROSUVASTATIN CALCIUM 20 MG PO TABS
20.0000 mg | ORAL_TABLET | Freq: Every day | ORAL | 3 refills | Status: AC
  Filled 2024-06-01: qty 90, 90d supply, fill #0

## 2024-06-01 NOTE — Patient Instructions (Signed)
 Try magnesium glycinate or magnesium threonate for muscle tension and sleep difficulty.

## 2024-06-01 NOTE — Assessment & Plan Note (Addendum)
 A1c in back in the normal range.  Doing well with dietary and lifestyle changes.

## 2024-06-01 NOTE — Assessment & Plan Note (Signed)
 Blood pressure remains well-controlled.  No longer taking coreg .

## 2024-06-01 NOTE — Progress Notes (Signed)
 Joshua Brady - 48 y.o. male MRN [id number]  Date of birth: [birth year]  Subjective Chief Complaint  Patient presents with   Diabetes   Shoulder Pain    HPI Joshua Brady is a 48 y.o. male here today for follow up visit.   He reports that he is doing well.  Have some tightness in shoulder.     He did d/c carvedilol  on his own.  History of HTN and CAD.  BP is well controlled at this time.  Denies anginal symptoms.    History of T2DM.  He has been managing with dietary changes and increased activity. A1c today is 5.6%.  He is using CGM to monitor glucose.  He discontinued atorvastatin  previously due to side effects. He was hoping changes to diet would help, however, his LDL in June was 212.   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   Low testosterone  04/29/2018   Lumbar degenerative disc disease 02/06/2020   Palpitations 03/03/2018   Sleep apnea 03/11/2018   SOB (shortness of breath) on exertion 03/03/2018   Viral illness 09/11/2019    Past Surgical History:  Procedure Laterality Date   Broken Arm     LEFT HEART CATH AND CORONARY ANGIOGRAPHY N/A 11/22/2020   Procedure: LEFT HEART CATH AND CORONARY ANGIOGRAPHY;  Surgeon: Burnard Debby LABOR, MD;  Location: [facility];  Service: Cardiovascular;  Laterality: N/A;   LIVER BIOPSY      Social History   Socioeconomic History   Marital status: Married    Spouse name: Markanthony Gedney   Number of children: 2   Years of education: Not on  file   Highest education level: 12th grade  Occupational History   Not on file  Tobacco Use   Smoking status: Never    Passive exposure: Never   Smokeless tobacco: Never  Vaping Use   Vaping status: Never Used  Substance and Sexual Activity   Alcohol use: Yes    Comment: social   Drug use: No   Sexual activity: Yes    Birth control/protection: None  Other Topics Concern   Not on file  Social History Narrative   Not on file   Social Drivers of Health   Tobacco Use: Low Risk (06/01/2024)   Patient History    Smoking Tobacco Use: Never    Smokeless Tobacco Use: Never    Passive Exposure: Never  Financial Resource Strain: Low Risk (09/05/2023)   Overall Financial Resource Strain (CARDIA)    Difficulty of Paying Living Expenses: Not hard at all  Food Insecurity: Food Insecurity Present (12/02/2023)   Epic    Worried About Programme Researcher, Broadcasting/film/video in the Last Year: Sometimes true    Ran Out of Food in the Last Year: Never true  Transportation Needs: No Transportation Needs (09/05/2023)   PRAPARE - Administrator, Civil Service (Medical): No    Lack of Transportation (Non-Medical): No  Physical Activity:  Sufficiently Active (09/05/2023)   Exercise Vital Sign    Days of Exercise per Week: 4 days    Minutes of Exercise per Session: 60 min  Stress: No Stress Concern Present (09/05/2023)   [employer] of Occupational Health - Occupational Stress Questionnaire    Feeling of Stress : Not at all  Social Connections: Moderately Integrated (12/02/2023)   Social Connection and Isolation Panel    Frequency of Communication with Friends and Family: Once a week    Frequency of Social Gatherings with Friends and Family: Once a week    Attends Religious Services: More than 4 times per year    Active Member of Clubs or Organizations: Yes    Attends Banker Meetings: 1 to 4 times per year    Marital Status: Married  Recent Concern: Social Connections - Moderately  Isolated (09/05/2023)   Social Connection and Isolation Panel    Frequency of Communication with Friends and Family: Once a week    Frequency of Social Gatherings with Friends and Family: Once a week    Attends Religious Services: More than 4 times per year    Active Member of [employer] or Organizations: No    Attends Engineer, Structural: Not on file    Marital Status: Married  Depression (PHQ2-9): Low Risk (06/01/2024)   Depression (PHQ2-9)    PHQ-2 Score: 2  Alcohol Screen: Low Risk (09/05/2023)   Alcohol Screen    Last Alcohol Screening Score (AUDIT): 1  Housing: High Risk (12/02/2023)   Epic    Unable to Pay for Housing in the Last Year: Yes    Number of Times Moved in the Last Year: 0    Homeless in the Last Year: No  Utilities: Not At Risk (12/02/2023)   Epic    Threatened with loss of utilities: No  Health Literacy: Adequate Health Literacy (12/02/2023)   B1300 Health Literacy    Frequency of need for help with medical instructions: Rarely    Family History  Problem Relation Age of Onset   Alcohol abuse Father    Myelodysplastic syndrome Father    Other Neg Hx        low testosterone     Health Maintenance  Topic Date Due   OPHTHALMOLOGY EXAM  Never done   Colonoscopy  06/02/2024 (Originally 08/28/2018)   Zoster Vaccines- Shingrix (1 of 2) 08/30/2024 (Originally 08/28/2023)   Influenza Vaccine  09/12/2024 (Originally 01/14/2024)   COVID-19 Vaccine (1 - 2025-26 season) 02/12/2025 (Originally 02/14/2024)   Pneumococcal Vaccine: 50+ Years (1 of 2 - PCV) 06/01/2025 (Originally 08/27/1992)   Hepatitis B Vaccines 19-59 Average Risk (1 of 3 - 19+ 3-dose series) 06/01/2025 (Originally 08/27/1992)   Hepatitis C Screening  06/01/2025 (Originally 08/28/1991)   HEMOGLOBIN A1C  11/30/2024   Diabetic kidney evaluation - eGFR measurement  12/01/2024   Diabetic kidney evaluation - Urine ACR  12/01/2024   FOOT EXAM  12/01/2024   DTaP/Tdap/Td (2 - Td or Tdap) 10/31/2025   HIV Screening   Completed   HPV VACCINES  Aged Out   Meningococcal B Vaccine  Aged Out     ----------------------------------------------------------------------------------------------------------------------------------------------------------------------------------------------------------------- Physical Exam BP 126/85 (BP Location: Left Arm, Patient Position: Sitting, Cuff Size: Large)   Pulse 75   Ht 6' (1.829 m)   Wt 257 lb (116.6 kg)   SpO2 100%   BMI 34.86 kg/m   Physical Exam Constitutional:      Appearance: Normal appearance.  Eyes:     General: No  scleral icterus. Cardiovascular:     Rate and Rhythm: Normal rate and regular rhythm.  Pulmonary:     Effort: Pulmonary effort is normal.     Breath sounds: Normal breath sounds.  Neurological:     Mental Status: He is alert.  Psychiatric:        Mood and Affect: Mood normal.        Behavior: Behavior normal.     ------------------------------------------------------------------------------------------------------------------------------------------------------------------------------------------------------------------- Assessment and Plan  Hypertension Blood pressure remains well-controlled.  No longer taking coreg .   Adjustment disorder with depressed mood Overall doing well with bupropion  but notes some increased anxiety.  Discussed adding magnesium glcyinate.    Type 2 diabetes mellitus without complication, without long-term current use of insulin (HCC) A1c in back in the normal range.  Doing well with dietary and lifestyle changes.    Dyslipidemia He is willing to try rosuvastatin .  Rx sent in.  Recheck lipids at 6 month f/u    Meds ordered this encounter  Medications   rosuvastatin  (CRESTOR ) 20 MG tablet    Sig: Take 1 tablet (20 mg total) by mouth daily.    Dispense:  90 tablet    Refill:  3    Return in about 6 months (around 11/30/2024) for Hypertension, T2DM, Update labs.        [1]   Allergies Allergen Reactions   Other     Ants - see notes from ER visit 02/2018   Prednisone      Suicidal, angry, increased libido   Topiramate     Anxiety, dizzy, depressed, suicidal thoughts, agitated

## 2024-06-01 NOTE — Assessment & Plan Note (Signed)
 Overall doing well with bupropion  but notes some increased anxiety.  Discussed adding magnesium glcyinate.

## 2024-06-01 NOTE — Assessment & Plan Note (Signed)
 He is willing to try rosuvastatin .  Rx sent in.  Recheck lipids at 6 month f/u

## 2024-06-13 ENCOUNTER — Other Ambulatory Visit (HOSPITAL_BASED_OUTPATIENT_CLINIC_OR_DEPARTMENT_OTHER): Payer: Self-pay

## 2024-06-13 ENCOUNTER — Encounter (HOSPITAL_BASED_OUTPATIENT_CLINIC_OR_DEPARTMENT_OTHER): Payer: Self-pay

## 2024-06-26 ENCOUNTER — Other Ambulatory Visit (HOSPITAL_BASED_OUTPATIENT_CLINIC_OR_DEPARTMENT_OTHER): Payer: Self-pay

## 2024-11-30 ENCOUNTER — Ambulatory Visit: Payer: Self-pay | Admitting: Family Medicine
